# Patient Record
Sex: Female | Born: 1955 | Race: White | Hispanic: No | Smoking: Never smoker
Health system: Southern US, Community
[De-identification: ages and names within clinical notes are randomized; demographics above are authoritative.]

## PROBLEM LIST (undated history)

## (undated) DIAGNOSIS — Z973 Presence of spectacles and contact lenses: Secondary | ICD-10-CM

## (undated) DIAGNOSIS — K219 Gastro-esophageal reflux disease without esophagitis: Secondary | ICD-10-CM

## (undated) DIAGNOSIS — Z8719 Personal history of other diseases of the digestive system: Secondary | ICD-10-CM

## (undated) DIAGNOSIS — I10 Essential (primary) hypertension: Secondary | ICD-10-CM

## (undated) DIAGNOSIS — M1990 Unspecified osteoarthritis, unspecified site: Secondary | ICD-10-CM

## (undated) DIAGNOSIS — E785 Hyperlipidemia, unspecified: Secondary | ICD-10-CM

## (undated) DIAGNOSIS — E119 Type 2 diabetes mellitus without complications: Secondary | ICD-10-CM

## (undated) HISTORY — DX: Hyperlipidemia, unspecified: E78.5

## (undated) HISTORY — PX: OTHER SURGICAL HISTORY: SHX169

---

## 2004-08-27 HISTORY — PX: COLONOSCOPY: SHX174

## 2004-12-15 ENCOUNTER — Ambulatory Visit: Payer: Self-pay | Admitting: Surgery

## 2005-01-16 ENCOUNTER — Ambulatory Visit: Payer: Self-pay | Admitting: Gastroenterology

## 2005-03-27 ENCOUNTER — Ambulatory Visit: Payer: Self-pay | Admitting: Family Medicine

## 2008-01-14 ENCOUNTER — Ambulatory Visit: Payer: Self-pay | Admitting: Family Medicine

## 2013-10-05 ENCOUNTER — Ambulatory Visit: Payer: Self-pay | Admitting: Internal Medicine

## 2013-11-16 ENCOUNTER — Encounter (INDEPENDENT_AMBULATORY_CARE_PROVIDER_SITE_OTHER): Payer: Self-pay

## 2013-11-16 ENCOUNTER — Ambulatory Visit: Payer: Self-pay | Admitting: Internal Medicine

## 2013-11-16 ENCOUNTER — Ambulatory Visit (INDEPENDENT_AMBULATORY_CARE_PROVIDER_SITE_OTHER): Payer: BC Managed Care – PPO | Admitting: Internal Medicine

## 2013-11-16 ENCOUNTER — Encounter: Payer: Self-pay | Admitting: Internal Medicine

## 2013-11-16 VITALS — BP 138/78 | HR 90 | Temp 97.9°F | Resp 16 | Ht 67.0 in | Wt 181.0 lb

## 2013-11-16 DIAGNOSIS — E559 Vitamin D deficiency, unspecified: Secondary | ICD-10-CM

## 2013-11-16 DIAGNOSIS — Z800 Family history of malignant neoplasm of digestive organs: Secondary | ICD-10-CM

## 2013-11-16 DIAGNOSIS — Z23 Encounter for immunization: Secondary | ICD-10-CM

## 2013-11-16 DIAGNOSIS — E785 Hyperlipidemia, unspecified: Secondary | ICD-10-CM

## 2013-11-16 DIAGNOSIS — M1990 Unspecified osteoarthritis, unspecified site: Secondary | ICD-10-CM

## 2013-11-16 DIAGNOSIS — R5381 Other malaise: Secondary | ICD-10-CM

## 2013-11-16 DIAGNOSIS — Z780 Asymptomatic menopausal state: Secondary | ICD-10-CM

## 2013-11-16 DIAGNOSIS — R5383 Other fatigue: Principal | ICD-10-CM

## 2013-11-16 DIAGNOSIS — Z1239 Encounter for other screening for malignant neoplasm of breast: Secondary | ICD-10-CM

## 2013-11-16 NOTE — Patient Instructions (Addendum)
Welcome to our practice!  I appreciate Lauren referring you and your husband to me  Return at your convenience for fasting labs,  And we will also check your vitamin d and thyroid level.  Amber will call you with an appt for the mammogram  You can set up your physical with me whenever you like

## 2013-11-16 NOTE — Progress Notes (Signed)
Patient ID: Kathy [name], female   DOB: 12/28/1955, 58 y.o.   MRN: [id number]  Patient Active Problem List   Diagnosis Date Noted  . Family history of colon cancer requiring screening colonoscopy 11/17/2013  . Screening for breast cancer 11/17/2013  . Osteoarthritis 11/17/2013  . Menopause 11/17/2013    Subjective:  CC:   Chief Complaint  Patient presents with  . Establish Care    HPI:   Kathy [name] a 58 y.o. female who presents to establish care .  Her last exam was in 2006 at [facility]. She had a  normal screening colonoscopy in 2006 at [place] and historically  normal mammograms  Last one at [facility]. .  Works two part times jobs. enjoys what she is doing.    Gardening ,  Sewing are her hobbies.  She and husband have a private vineyard.  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 Half sister with breast ca.    History reviewed. No pertinent past medical history.   Allergies  Allergen Reactions  . Neosporin [Neomycin-Bacitracin Zn-Polymyx] Rash      Past Surgical History  Procedure Laterality Date  . Fatty tissue under bilateral arm      History   Social History  . Marital Status: Married    Spouse Name: N/A    Number of Children: N/A  . Years of Education: N/A   Occupational History  . Not on file.   Social History Main Topics  . Smoking status: Never Smoker   . Smokeless tobacco: Never Used  . Alcohol Use: Yes     Comment: occasionally  . Drug Use: No  . Sexual Activity: No   Other Topics Concern  . Not on file   Social History Narrative  . No narrative on file   Family History  Problem Relation Age of Onset  . Hyperlipidemia Mother   . Hypertension Mother   . Diabetes Mother   . Cancer Father 23    colon      Review of Systems:   The rest of the review of systems was negative except those addressed in the HPI.      Objective:  BP 138/78  Pulse 90  Temp(Src) 97.9 F (36.6 C) (Oral)  Resp 16  Ht 5\' 7"  (1.702 m)  Wt 181 lb (82.101 kg)  BMI 28.34 kg/m2  SpO2 98%  General appearance: alert, cooperative and appears stated age Ears: normal TM's and external ear canals both ears Throat: lips, mucosa, and tongue normal; teeth and gums normal Neck: no adenopathy, no carotid bruit, supple, symmetrical, trachea midline and thyroid not enlarged, symmetric, no tenderness/mass/nodules Back: symmetric, no curvature. ROM normal. No CVA tenderness. Lungs: clear to auscultation bilaterally Heart: regular rate and rhythm, S1, S2 normal, no murmur, click, rub or gallop Abdomen: soft, non-tender; bowel sounds normal; no masses,  no organomegaly Pulses: 2+ and symmetric Skin: Skin color, texture, turgor normal. No rashes or lesions Lymph nodes: Cervical, supraclavicular, and axillary nodes normal.  Assessment and Plan:  Family history of colon cancer requiring screening colonoscopy Patient is adamant that her father's colon Ca was environmentally induced and has not had 5 yr follow up based on this assumption  Has no change in bowel habits or blood in stool and last colonoscopy was normal at at 49. .  Will request records from [facility].    Screening for breast cancer Mammogram ordered.  Return for annul exam 3 month s  Osteoarthritis Managed with krill oil.   Menopause Currently asymptomatic.    Updated Medication List Outpatient Encounter Prescriptions as of 11/16/2013  Medication Sig  . aspirin 81 MG tablet Take 81 mg by mouth daily.  . Cholecalciferol (VITAMIN D3) 10000 UNITS capsule Take 10,000 Units by mouth daily.  . Cranberry (SM CRANBERRY) 300 MG tablet Take 300 mg by mouth daily.  . Cyanocobalamin 5000 MCG SUBL Place 1 tablet under the tongue daily.  Marland Kitchen KRILL OIL PO Take 1  capsule by mouth daily.  . Multiple Vitamins-Minerals (MULTIVITAMIN WITH MINERALS) tablet Take 1 tablet by mouth 2 (two) times daily.  . naproxen sodium (ANAPROX) 220 MG tablet Take 220 mg by mouth daily.     Orders Placed This Encounter  Procedures  . MM DIGITAL SCREENING BILATERAL  . Tdap vaccine greater than or equal to 7yo IM  . Comprehensive metabolic panel  . CBC with Differential  . TSH  . Lipid panel  . Vit D  25 hydroxy (rtn osteoporosis monitoring)    No Follow-up on file.

## 2013-11-17 ENCOUNTER — Encounter: Payer: Self-pay | Admitting: Internal Medicine

## 2013-11-17 DIAGNOSIS — Z1239 Encounter for other screening for malignant neoplasm of breast: Secondary | ICD-10-CM | POA: Insufficient documentation

## 2013-11-17 DIAGNOSIS — Z780 Asymptomatic menopausal state: Secondary | ICD-10-CM | POA: Insufficient documentation

## 2013-11-17 DIAGNOSIS — M1990 Unspecified osteoarthritis, unspecified site: Secondary | ICD-10-CM | POA: Insufficient documentation

## 2013-11-17 DIAGNOSIS — Z800 Family history of malignant neoplasm of digestive organs: Secondary | ICD-10-CM | POA: Insufficient documentation

## 2013-11-17 NOTE — Assessment & Plan Note (Signed)
Mammogram ordered.  Return for annul exam 3 month s

## 2013-11-17 NOTE — Assessment & Plan Note (Signed)
Managed with krill oil.

## 2013-11-17 NOTE — Assessment & Plan Note (Signed)
Patient is adamant that her father's colon Ca was environmentally induced and has not had 5 yr follow up based on this assumption Has no change in bowel habits or blood in stool and last colonoscopy was normal at at 49. .  Will request records from [facility].

## 2013-11-17 NOTE — Assessment & Plan Note (Signed)
Currently asymptomatic 

## 2013-11-24 ENCOUNTER — Encounter: Payer: Self-pay | Admitting: Emergency Medicine

## 2013-11-26 ENCOUNTER — Other Ambulatory Visit (INDEPENDENT_AMBULATORY_CARE_PROVIDER_SITE_OTHER): Payer: BC Managed Care – PPO

## 2013-11-26 DIAGNOSIS — E785 Hyperlipidemia, unspecified: Secondary | ICD-10-CM

## 2013-11-26 DIAGNOSIS — R5383 Other fatigue: Principal | ICD-10-CM

## 2013-11-26 DIAGNOSIS — R5381 Other malaise: Secondary | ICD-10-CM

## 2013-11-26 DIAGNOSIS — E559 Vitamin D deficiency, unspecified: Secondary | ICD-10-CM

## 2013-11-26 LAB — COMPREHENSIVE METABOLIC PANEL
ALK PHOS: 72 U/L (ref 39–117)
ALT: 16 U/L (ref 0–35)
AST: 18 U/L (ref 0–37)
Albumin: 4.1 g/dL (ref 3.5–5.2)
BUN: 16 mg/dL (ref 6–23)
CO2: 28 mEq/L (ref 19–32)
Calcium: 9.3 mg/dL (ref 8.4–10.5)
Chloride: 102 mEq/L (ref 96–112)
Creatinine, Ser: 0.8 mg/dL (ref 0.4–1.2)
GFR: 78.05 mL/min (ref >60.00)
Glucose, Bld: 100 mg/dL — ABNORMAL HIGH (ref 70–99)
POTASSIUM: 3.9 meq/L (ref 3.5–5.1)
SODIUM: 138 meq/L (ref 135–145)
TOTAL PROTEIN: 6.9 g/dL (ref 6.0–8.3)
Total Bilirubin: 0.5 mg/dL (ref 0.3–1.2)

## 2013-11-26 LAB — LIPID PANEL
Cholesterol: 217 mg/dL — ABNORMAL HIGH (ref 0–200)
HDL: 90.1 mg/dL (ref >39.00)
LDL Cholesterol: 120 mg/dL — ABNORMAL HIGH (ref 0–99)
Total CHOL/HDL Ratio: 2
Triglycerides: 35 mg/dL (ref 0.0–149.0)
VLDL: 7 mg/dL (ref 0.0–40.0)

## 2013-11-26 LAB — CBC WITH DIFFERENTIAL/PLATELET
BASOS ABS: 0 10*3/uL (ref 0.0–0.1)
Basophils Relative: 0.5 % (ref 0.0–3.0)
EOS PCT: 2.1 % (ref 0.0–5.0)
Eosinophils Absolute: 0.1 10*3/uL (ref 0.0–0.7)
HEMATOCRIT: 35.3 % — AB (ref 36.0–46.0)
Hemoglobin: 12 g/dL (ref 12.0–15.0)
Lymphocytes Relative: 22.1 % (ref 12.0–46.0)
Lymphs Abs: 1.2 10*3/uL (ref 0.7–4.0)
MCHC: 33.8 g/dL (ref 30.0–36.0)
MCV: 90.8 fl (ref 78.0–100.0)
Monocytes Absolute: 0.3 10*3/uL (ref 0.1–1.0)
Monocytes Relative: 5.3 % (ref 3.0–12.0)
Neutro Abs: 3.8 10*3/uL (ref 1.4–7.7)
Neutrophils Relative %: 70 % (ref 43.0–77.0)
PLATELETS: 301 10*3/uL (ref 150.0–400.0)
RBC: 3.89 Mil/uL (ref 3.87–5.11)
RDW: 12.6 % (ref 11.5–14.6)
WBC: 5.4 10*3/uL (ref 4.5–10.5)

## 2013-11-26 LAB — TSH: TSH: 0.88 u[IU]/mL (ref 0.35–5.50)

## 2013-11-27 LAB — VITAMIN D 25 HYDROXY (VIT D DEFICIENCY, FRACTURES): Vit D, 25-Hydroxy: 73 ng/mL (ref 30–89)

## 2013-11-29 ENCOUNTER — Encounter: Payer: Self-pay | Admitting: Internal Medicine

## 2013-11-30 LAB — HM MAMMOGRAPHY: HM Mammogram: NORMAL

## 2013-12-01 ENCOUNTER — Other Ambulatory Visit: Payer: BC Managed Care – PPO

## 2013-12-10 ENCOUNTER — Ambulatory Visit: Payer: Self-pay | Admitting: Internal Medicine

## 2013-12-21 ENCOUNTER — Other Ambulatory Visit (HOSPITAL_COMMUNITY)
Admission: RE | Admit: 2013-12-21 | Discharge: 2013-12-21 | Disposition: A | Discharge status: Home / Self Care | Payer: BC Managed Care – PPO | Source: Ambulatory Visit | Attending: Internal Medicine | Admitting: Internal Medicine

## 2013-12-21 ENCOUNTER — Ambulatory Visit (INDEPENDENT_AMBULATORY_CARE_PROVIDER_SITE_OTHER): Payer: BC Managed Care – PPO | Admitting: Internal Medicine

## 2013-12-21 ENCOUNTER — Encounter: Payer: Self-pay | Admitting: Internal Medicine

## 2013-12-21 VITALS — BP 124/74 | HR 79 | Temp 98.3°F | Resp 16 | Ht 67.0 in | Wt 180.2 lb

## 2013-12-21 DIAGNOSIS — Z124 Encounter for screening for malignant neoplasm of cervix: Secondary | ICD-10-CM

## 2013-12-21 DIAGNOSIS — R9389 Abnormal findings on diagnostic imaging of other specified body structures: Secondary | ICD-10-CM

## 2013-12-21 DIAGNOSIS — Z1151 Encounter for screening for human papillomavirus (HPV): Secondary | ICD-10-CM | POA: Insufficient documentation

## 2013-12-21 DIAGNOSIS — Z01411 Encounter for gynecological examination (general) (routine) with abnormal findings: Secondary | ICD-10-CM | POA: Insufficient documentation

## 2013-12-21 DIAGNOSIS — Z01419 Encounter for gynecological examination (general) (routine) without abnormal findings: Secondary | ICD-10-CM | POA: Insufficient documentation

## 2013-12-21 DIAGNOSIS — Z0000 Encounter for general adult medical examination without abnormal findings: Secondary | ICD-10-CM

## 2013-12-21 NOTE — Progress Notes (Signed)
Patient ID: Kathy [name], female   DOB: 27-May-1956, 58 y.o.   MRN: [id number]   Subjective:     Kathy [name] is a 58 y.o. female and is here for a comprehensive physical exam. The patient reports no problems.  History   Social History  . Marital Status: Married    Spouse Name: N/A    Number of Children: N/A  . Years of Education: N/A   Occupational History  . Not on file.   Social History Main Topics  . Smoking status: Never Smoker   . Smokeless tobacco: Never Used  . Alcohol Use: Yes     Comment: occasionally  . Drug Use: No  . Sexual Activity: No   Other Topics Concern  . Not on file   Social History Narrative  . No narrative on file   Health Maintenance  Topic Date Due  . Pap Smear  12/26/1972  . Mammogram  12/26/2004  . Colonoscopy  12/26/2004  . Influenza Vaccine  03/27/2014  . Tetanus/tdap  11/17/2023    The following portions of the patient's history were reviewed and updated as appropriate: allergies, current medications, past family history, past medical history, past social history, past surgical history and problem list.  Review of Systems A comprehensive review of systems was negative.   Objective:   General Appearance:    Alert, cooperative, no distress, appears stated age  Head:    Normocephalic, without obvious abnormality, atraumatic  Eyes:    PERRL, conjunctiva/corneas clear, EOM's intact, fundi    benign, both eyes  Ears:    Normal TM's and external ear canals, both ears  Nose:   Nares normal, septum midline, mucosa normal, no drainage    or sinus tenderness  Throat:   Lips, mucosa, and tongue normal; teeth and gums normal  Neck:   Supple, symmetrical, trachea midline, no adenopathy;    thyroid:  no enlargement/tenderness/nodules; no carotid   bruit or JVD  Back:     Symmetric, no curvature, ROM normal, no CVA tenderness  Lungs:     Clear to auscultation bilaterally, respirations unlabored  Chest Wall:    No tenderness or deformity   Heart:     Regular rate and rhythm, S1 and S2 normal, no murmur, rub   or gallop  Breast Exam:    No tenderness, masses, or nipple abnormality  Abdomen:     Soft, non-tender, bowel sounds active all four quadrants,    no masses, no organomegaly  Genitalia:    Pelvic: cervix normal in appearance, external genitalia normal, no adnexal masses or tenderness, no cervical motion tenderness, rectovaginal septum abnormal with a firmeness noted in posterior wall of vagina normal, uterus normal size, shape, and consistency   Extremities:   Extremities normal, atraumatic, no cyanosis or edema  Pulses:   2+ and symmetric all extremities  Skin:   Skin color, texture, turgor normal, no rashes or lesions  Lymph nodes:   Cervical, supraclavicular, and axillary nodes normal  Neurologic:   CNII-XII intact, normal strength, sensation and reflexes    throughout     Assessment and Plan:    Encounter for preventive health examination Annual comprehensive exam was done including breast, pelvic and PAP smear. All screenings have been addressed .   Abnormal pelvic exam Posterior vaginal wall has an abnormal firmness that needs further evaluation. Referral to [facility].    Updated Medication List Outpatient Encounter Prescriptions as of 12/21/2013  Medication Sig  . aspirin 81 MG tablet Take 81  mg by mouth daily.  . Cholecalciferol (VITAMIN D3) 10000 UNITS capsule Take 10,000 Units by mouth daily.  . Cranberry (SM CRANBERRY) 300 MG tablet Take 300 mg by mouth daily.  . Cyanocobalamin 5000 MCG SUBL Place 1 tablet under the tongue daily.  Marland Kitchen KRILL OIL PO Take 1 capsule by mouth daily.  . Multiple Vitamins-Minerals (MULTIVITAMIN WITH MINERALS) tablet Take 1 tablet by mouth 2 (two) times daily.  . naproxen sodium (ANAPROX) 220 MG tablet Take 220 mg by mouth daily.

## 2013-12-21 NOTE — Progress Notes (Signed)
Pre-visit discussion using our clinic review tool. No additional management support is needed unless otherwise documented below in the visit note.  

## 2013-12-21 NOTE — Assessment & Plan Note (Signed)
Posterior vaginal wall has an abnormal firmness that needs further evaluation. Referral to [facility].

## 2013-12-21 NOTE — Assessment & Plan Note (Signed)
Annual comprehensive exam was done including breast, pelvic and PAP smear. All screenings have been addressed .  

## 2013-12-21 NOTE — Patient Instructions (Signed)
You had your annual  wellness exam today  Your PAP smear was done  Your vaginal exam was slightly abnormal due to a firmness in on your ledt side,  i would like a gynecologist to evaluate you and will start a referral to Dr Enzo Bi.,

## 2013-12-24 ENCOUNTER — Encounter: Payer: Self-pay | Admitting: Internal Medicine

## 2014-01-07 ENCOUNTER — Encounter: Payer: Self-pay | Admitting: Internal Medicine

## 2014-01-07 NOTE — Telephone Encounter (Signed)
Mailed unread message to pt  

## 2014-01-31 ENCOUNTER — Emergency Department: Payer: Self-pay | Admitting: Emergency Medicine

## 2014-02-03 ENCOUNTER — Ambulatory Visit (INDEPENDENT_AMBULATORY_CARE_PROVIDER_SITE_OTHER): Payer: BC Managed Care – PPO | Admitting: Adult Health

## 2014-02-03 ENCOUNTER — Encounter: Payer: Self-pay | Admitting: Adult Health

## 2014-02-03 VITALS — BP 161/84 | HR 70 | Temp 98.2°F | Resp 14 | Ht 67.0 in | Wt 180.2 lb

## 2014-02-03 DIAGNOSIS — W5911XA Bitten by nonvenomous snake, initial encounter: Secondary | ICD-10-CM

## 2014-02-03 DIAGNOSIS — W5901XA Bitten by nonvenomous lizards, initial encounter: Secondary | ICD-10-CM

## 2014-02-03 DIAGNOSIS — M7989 Other specified soft tissue disorders: Secondary | ICD-10-CM

## 2014-02-03 NOTE — Progress Notes (Signed)
Patient ID: Kathy [name], female   DOB: Jan 20, 1956, 58 y.o.   MRN: [id number]   Subjective:    Patient ID: Kathy [name], female    DOB: [birth year], 58 y.o.   MRN: [id number]  HPI Pt is a pleasant 58 y/o female who presents to clinic following snake bite to the right great toe on Saturday evening. She was seen in the emergency room. She has been started on Keflex 3 times a day x7 days. Patient did not have any systemic symptoms. She reports that her right foot became swollen and bruised. This has all improved. She has developed a blister at the tip of the great toe.   Current Outpatient Prescriptions on File Prior to Visit  Medication Sig Dispense Refill  . aspirin 81 MG tablet Take 81 mg by mouth daily.      . Cholecalciferol (VITAMIN D3) 10000 UNITS capsule Take 10,000 Units by mouth daily.      . Cranberry (SM CRANBERRY) 300 MG tablet Take 300 mg by mouth daily.      . Cyanocobalamin 5000 MCG SUBL Place 1 tablet under the tongue daily.      Marland Kitchen KRILL OIL PO Take 1 capsule by mouth daily.      . Multiple Vitamins-Minerals (MULTIVITAMIN WITH MINERALS) tablet Take 1 tablet by mouth 2 (two) times daily.      . naproxen sodium (ANAPROX) 220 MG tablet Take 220 mg by mouth daily.       No current facility-administered medications on file prior to visit.     Review of Systems  Constitutional: Negative.  Negative for fever, chills and fatigue.  Respiratory: Negative for chest tightness and shortness of breath.   Cardiovascular: Positive for leg swelling (swelling of right foot). Negative for chest pain.  Gastrointestinal: Negative.   Genitourinary: Negative.   Musculoskeletal: Positive for arthralgias.  Neurological: Negative for dizziness, weakness, light-headedness and headaches.  Psychiatric/Behavioral: Negative.        Objective:  BP 161/84  Pulse 70  Temp(Src) 98.2 F (36.8 C) (Oral)  Resp 14  Ht 5\' 7"  (1.702 m)  Wt 180 lb 4 oz (81.761 kg)  BMI 28.22 kg/m2  SpO2 99%   Physical  Exam  Constitutional: She is oriented to person, place, and time. She appears well-developed. No distress.  HENT:  Head: Normocephalic and atraumatic.  Eyes: Conjunctivae and EOM are normal.  Neck: Normal range of motion. Neck supple.  Cardiovascular: Normal rate and regular rhythm.   Pulmonary/Chest: Effort normal. No respiratory distress.  Musculoskeletal: Normal range of motion. She exhibits edema.  Edema of right foot.  Neurological: She is alert and oriented to person, place, and time. She has normal reflexes.  Skin: Skin is warm and dry.  Right great toe with blister on the tip. Bruised. Erythema surrounding nail and nail bed.   Psychiatric: She has a normal mood and affect. Her behavior is normal. Judgment and thought content normal.      Assessment & Plan:   1. Nonvenomous snake bite Continue keflex 500 mg tid as directed.  Elevate foot as much as possible and try to stay off the right foot. Keep clean and dry. Do not apply ice directly where snake bit you. You can apply ice on the top of foot for swelling. Call if symptoms worsen. It appears to be healing nicely. Do not pop blister.

## 2014-02-03 NOTE — Progress Notes (Signed)
Pre visit review using our clinic review tool, if applicable. No additional management support is needed unless otherwise documented below in the visit note. 

## 2014-02-18 ENCOUNTER — Other Ambulatory Visit: Payer: Self-pay | Admitting: Internal Medicine

## 2014-02-18 DIAGNOSIS — Z1211 Encounter for screening for malignant neoplasm of colon: Secondary | ICD-10-CM

## 2014-08-02 ENCOUNTER — Encounter: Payer: Self-pay | Admitting: Internal Medicine

## 2014-08-02 ENCOUNTER — Ambulatory Visit (INDEPENDENT_AMBULATORY_CARE_PROVIDER_SITE_OTHER): Payer: BC Managed Care – PPO | Admitting: Internal Medicine

## 2014-08-02 VITALS — BP 140/88 | HR 78 | Temp 98.2°F | Resp 16 | Ht 66.0 in | Wt 180.5 lb

## 2014-08-02 DIAGNOSIS — J209 Acute bronchitis, unspecified: Secondary | ICD-10-CM

## 2014-08-02 DIAGNOSIS — L723 Sebaceous cyst: Secondary | ICD-10-CM

## 2014-08-02 DIAGNOSIS — L089 Local infection of the skin and subcutaneous tissue, unspecified: Secondary | ICD-10-CM

## 2014-08-02 MED ORDER — PREDNISONE (PAK) 10 MG PO TABS: ORAL_TABLET | ORAL | Status: DC

## 2014-08-02 MED ORDER — SULFAMETHOXAZOLE-TRIMETHOPRIM 800-160 MG PO TABS: 1.0000 | ORAL_TABLET | Freq: Two times a day (BID) | ORAL | Status: DC

## 2014-08-02 NOTE — Progress Notes (Signed)
Patient ID: Kathy [name], female   DOB: [birth year], 58 y.o.   MRN: [id number]   Patient Active Problem List   Diagnosis Date Noted  . Acute bronchitis 08/03/2014  . Infected sebaceous cyst of skin 08/03/2014  . Nonvenomous snake bite 02/03/2014  . Encounter for preventive health examination 12/21/2013  . Abnormal pelvic exam 12/21/2013  . Family history of colon cancer requiring screening colonoscopy 11/17/2013  . Screening for breast cancer 11/17/2013  . Osteoarthritis 11/17/2013  . Menopause 11/17/2013    Subjective:  CC:   Chief Complaint  Patient presents with  . Cough    congestion productive cough yellow in color usually after a meal  . Mass    posterior neck line to mid clavicular.    HPI:   WAHNETA [name] is a 58 y.o. female who presentsat the after hours clinic  for evaluation of two issues:  1) 4 weeks of sinus drainage without congestion, fevers or facial pain .  Has been treating with mucinex,, sudafed  , cough suppressant.  Still has cough which is at times productive.  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  No recent travel ,  Does not smoke.  No pleurisy    2) enlarging mass at base of neck on posterior side,  Has been present for years, but recently became red and larger.  Denies spontaneous drainage , has not been manipulating it,  Slightly tender.    No past medical history on file.  Past Surgical History  Procedure Laterality Date  . Fatty tissue under bilateral arm         The following portions of the patient's history were reviewed and updated as appropriate: Allergies, current medications, and problem list.    Review of Systems:   Patient denies headache, fevers, malaise, unintentional weight loss, skin rash, eye pain, sinus congestion and sinus pain, sore throat, dysphagia,  hemoptysis , cough, dyspnea, wheezing, chest pain, palpitations, orthopnea, edema, abdominal pain, nausea, melena, diarrhea, constipation, flank  pain, dysuria, hematuria, urinary  Frequency, nocturia, numbness, tingling, seizures,  Focal weakness, Loss of consciousness,  Tremor, insomnia, depression, anxiety, and suicidal ideation.     History   Social History  . Marital Status: Married    Spouse Name: N/A    Number of Children: N/A  . Years of Education: N/A   Occupational History  . Not on file.   Social History Main Topics  . Smoking status: Never Smoker   . Smokeless tobacco: Never Used  . Alcohol Use: Yes     Comment: occasionally  . Drug Use: No  . Sexual Activity: No   Other Topics Concern  . Not on file   Social History Narrative    Objective:  Filed Vitals:   08/02/14 1911  BP: 140/88  Pulse: 78  Temp: 98.2 F (36.8 C)  Resp: 16     General appearance: alert, cooperative and appears stated age Ears: normal TM's and external ear canals both ears Throat: lips, mucosa, and tongue normal; teeth and gums normal Neck: no adenopathy, no carotid bruit, supple, symmetrical, trachea midline and thyroid not enlarged, symmetric, no tenderness/mass/nodules Back: symmetric, no curvature. ROM normal. No CVA tenderness. Lungs: clear to auscultation bilaterally Heart: regular rate and rhythm, S1, S2 normal, no murmur, click, rub or gallop Abdomen: soft, non-tender; bowel sounds normal; no masses,  no organomegaly Pulses: 2+ and symmetric Skin: Skin color, texture, turgor normal. No rashes or lesions Lymph nodes: Cervical,  supraclavicular, and axillary nodes normal.  Assessment and Plan:  Acute bronchitis URI is most likely viral given the mild HEENT  Symptoms  And normal exam.   I have explained that in viral URIS, an antibiotic will not help the symptoms and will increase the risk of developing diarrhea.,  Continue oral and nasal decongestants, and tylenol 650 mq 8 hrs for aches and pains,  And will prednisone  taper for inflammation  Infected sebaceous cyst of skin Prescribing Septra DS for one week,  Warm  compresses.  If no improvement refer to Gen surg for I & D>    Updated Medication List Outpatient Encounter Prescriptions as of 08/02/2014  Medication Sig  . Ascorbic Acid (VITAMIN C) 1000 MG tablet Take 1,000 mg by mouth daily.  Marland Kitchen aspirin 81 MG tablet Take 81 mg by mouth daily.  . Cholecalciferol (VITAMIN D3) 10000 UNITS capsule Take 10,000 Units by mouth daily.  . Cranberry (SM CRANBERRY) 300 MG tablet Take 300 mg by mouth daily.  . Cyanocobalamin 5000 MCG SUBL Place 1 tablet under the tongue daily.  Marland Kitchen KRILL OIL PO Take 1 capsule by mouth daily.  . Multiple Vitamins-Minerals (MULTIVITAMIN WITH MINERALS) tablet Take 1 tablet by mouth 2 (two) times daily.  . naproxen sodium (ANAPROX) 220 MG tablet Take 220 mg by mouth daily.  . predniSONE (STERAPRED UNI-PAK) 10 MG tablet 6 tablets on Day 1 , then reduce by 1 tablet daily until gone  . sulfamethoxazole-trimethoprim (SEPTRA DS) 800-160 MG per tablet Take 1 tablet by mouth 2 (two) times daily.     No orders of the defined types were placed in this encounter.    No Follow-up on file.

## 2014-08-02 NOTE — Progress Notes (Signed)
Pre-visit discussion using our clinic review tool. No additional management support is needed unless otherwise documented below in the visit note.  

## 2014-08-02 NOTE — Patient Instructions (Addendum)
I Am treating you for an infected sebaceous cyst with a week of Septra  DS .  DO NOT TRY TO SQUEEZE IT.  It may start to spontaneously drain if you apply warm compresses  If it does not resolve with the antibiotics,  I will refer you to Dr. Bary Castilla to have it incised and drained  The prednisone taper will help resolve your bronchitis.   Please take a probiotic ( Align, Floraque or Culturelle) for 2 weeks if you start the antibiotic to prevent a serious antibiotic associated diarrhea  Called" clostridium dificile colitis" ( should also help prevent   vaginal yeast infection) 9

## 2014-08-03 DIAGNOSIS — L089 Local infection of the skin and subcutaneous tissue, unspecified: Secondary | ICD-10-CM | POA: Insufficient documentation

## 2014-08-03 DIAGNOSIS — L723 Sebaceous cyst: Secondary | ICD-10-CM

## 2014-08-03 DIAGNOSIS — J209 Acute bronchitis, unspecified: Secondary | ICD-10-CM | POA: Insufficient documentation

## 2014-08-03 NOTE — Assessment & Plan Note (Signed)
URI is most likely viral given the mild HEENT  Symptoms  And normal exam.   I have explained that in viral URIS, an antibiotic will not help the symptoms and will increase the risk of developing diarrhea.,  Continue oral and nasal decongestants, and tylenol 650 mq 8 hrs for aches and pains,  And will prednisone  taper for inflammation 

## 2014-08-03 NOTE — Assessment & Plan Note (Signed)
Prescribing Septra DS for one week,  Warm compresses.  If no improvement refer to Gen surg for I & D>

## 2014-08-10 ENCOUNTER — Encounter: Payer: Self-pay | Admitting: Internal Medicine

## 2014-08-10 DIAGNOSIS — L723 Sebaceous cyst: Secondary | ICD-10-CM

## 2014-08-16 NOTE — Telephone Encounter (Signed)
The patient called asking had she been referred to a surgeon for the cyst that is infected on her back.  She is needing a referral put in the system.

## 2014-08-17 ENCOUNTER — Encounter: Payer: Self-pay | Admitting: General Surgery

## 2014-08-17 NOTE — Telephone Encounter (Signed)
Please review previous note from [place]. Patient would like a referral related to the cyst on her back. Please advise.

## 2014-08-24 ENCOUNTER — Encounter: Payer: Self-pay | Admitting: General Surgery

## 2014-08-24 ENCOUNTER — Ambulatory Visit (INDEPENDENT_AMBULATORY_CARE_PROVIDER_SITE_OTHER): Payer: BC Managed Care – PPO | Admitting: General Surgery

## 2014-08-24 DIAGNOSIS — L089 Local infection of the skin and subcutaneous tissue, unspecified: Secondary | ICD-10-CM

## 2014-08-24 DIAGNOSIS — L723 Sebaceous cyst: Secondary | ICD-10-CM

## 2014-08-24 NOTE — Patient Instructions (Signed)
Warm compresses daily to the area

## 2014-08-24 NOTE — Progress Notes (Signed)
Patient ID: Kathy [name], female   DOB: May 08, 1956, 58 y.o.   MRN: [id number]  Chief Complaint  Patient presents with  . Cyst    HPI Kathy [name] is a 58 y.o. female.  Here today for evaluation of a cyst on her back. She first noticed the cyst getting larger after Thanksgiving. The area has been there for about one year but has never really bothered her before. She has finished her septra and prednisone. She did notice drainage on Monday 08-09-14 and says it is much better than it was. Denies pain in the area.   HPI  No past medical history on file.  Past Surgical History  Procedure Laterality Date  . Fatty tissue under bilateral arm    . Colonoscopy  2006    Family History  Problem Relation Age of Onset  . Hyperlipidemia Mother   . Hypertension Mother   . Diabetes Mother   . Cancer Father 25    colon    Social History History  Substance Use Topics  . Smoking status: Never Smoker   . Smokeless tobacco: Never Used  . Alcohol Use: Yes     Comment: occasionally    Allergies  Allergen Reactions  . Neosporin [Neomycin-Bacitracin Zn-Polymyx] Rash    Current Outpatient Prescriptions  Medication Sig Dispense Refill  . Ascorbic Acid (VITAMIN C) 1000 MG tablet Take 1,000 mg by mouth daily.    Marland Kitchen aspirin 81 MG tablet Take 81 mg by mouth daily.    . Cholecalciferol (VITAMIN D3) 10000 UNITS capsule Take 10,000 Units by mouth daily.    . Cranberry (SM CRANBERRY) 300 MG tablet Take 300 mg by mouth daily.    . Cyanocobalamin 5000 MCG SUBL Place 1 tablet under the tongue daily.    . diphenhydrAMINE (SOMINEX) 25 MG tablet Take 25 mg by mouth at bedtime as needed for sleep.    Marland Kitchen KRILL OIL PO Take 1 capsule by mouth daily.    . Multiple Vitamins-Minerals (MULTIVITAMIN WITH MINERALS) tablet Take 1 tablet by mouth 2 (two) times daily.    . naproxen sodium (ANAPROX) 220 MG tablet Take 220 mg by mouth daily.    . predniSONE (STERAPRED UNI-PAK) 10 MG tablet 6 tablets on Day 1 , then  reduce by 1 tablet daily until gone 21 tablet 0  . Probiotic Product (PROBIOTIC DAILY PO) Take by mouth daily.    Marland Kitchen sulfamethoxazole-trimethoprim (SEPTRA DS) 800-160 MG per tablet Take 1 tablet by mouth 2 (two) times daily. 14 tablet 0   No current facility-administered medications for this visit.    Review of Systems Review of Systems  Constitutional: Negative.   Respiratory: Negative.   Cardiovascular: Negative.     Blood pressure 130/64, pulse 78, resp. rate 12, height 5\' 6"  (1.676 m), weight 180 lb (81.647 kg).  Physical Exam Physical Exam  Constitutional: She is oriented to person, place, and time. She appears well-developed and well-nourished.  Musculoskeletal:       Arms: Neurological: She is alert and oriented to person, place, and time.  Skin: Skin is warm and dry.  2 x 3 cm area of resolving induration with central pore right of midline at T1    Data Reviewed PCP notes.  Assessment    Infected sebaceous cyst of the right upper back, resolving.    Plan    Excisional be less extensive and less uncomfortable if the inflammatory process is given another couple of weeks to resolve. This should result in a  smaller scar and less likelihood of removing normal tissue.    Warm compresses daily to the area  Follow up 2 weeks for excision.   PCP:  Mattie Marlin 08/25/2014, 6:49 AM

## 2014-08-25 DIAGNOSIS — L089 Local infection of the skin and subcutaneous tissue, unspecified: Secondary | ICD-10-CM | POA: Insufficient documentation

## 2014-08-25 DIAGNOSIS — L723 Sebaceous cyst: Principal | ICD-10-CM

## 2014-09-09 ENCOUNTER — Encounter: Payer: Self-pay | Admitting: General Surgery

## 2014-09-09 ENCOUNTER — Ambulatory Visit (INDEPENDENT_AMBULATORY_CARE_PROVIDER_SITE_OTHER): Payer: BLUE CROSS/BLUE SHIELD | Admitting: General Surgery

## 2014-09-09 VITALS — BP 144/70 | Ht 66.0 in | Wt 183.0 lb

## 2014-09-09 DIAGNOSIS — L723 Sebaceous cyst: Secondary | ICD-10-CM

## 2014-09-09 NOTE — Patient Instructions (Signed)
Keep area clean 

## 2014-09-09 NOTE — Progress Notes (Signed)
Patient ID: Kathy [name], female   DOB: 10/16/1955, 59 y.o.   MRN: [id number]  Chief Complaint  Patient presents with  . Procedure    HPI Kathy [name] is a 58 y.o. female.  Here today for excision of lesion base of posterior neck. She states the area drained again last Tuesday briefly.  HPI  No past medical history on file.  Past Surgical History  Procedure Laterality Date  . Fatty tissue under bilateral arm    . Colonoscopy  2006    Family History  Problem Relation Age of Onset  . Hyperlipidemia Mother   . Hypertension Mother   . Diabetes Mother   . Cancer Father 64    colon    Social History History  Substance Use Topics  . Smoking status: Never Smoker   . Smokeless tobacco: Never Used  . Alcohol Use: Yes     Comment: occasionally    Allergies  Allergen Reactions  . Neosporin [Neomycin-Bacitracin Zn-Polymyx] Rash    Current Outpatient Prescriptions  Medication Sig Dispense Refill  . Ascorbic Acid (VITAMIN C) 1000 MG tablet Take 1,000 mg by mouth daily.    Marland Kitchen aspirin 81 MG tablet Take 81 mg by mouth daily.    . Cholecalciferol (VITAMIN D3) 10000 UNITS capsule Take 10,000 Units by mouth daily.    . Cranberry (SM CRANBERRY) 300 MG tablet Take 300 mg by mouth daily.    . Cyanocobalamin 5000 MCG SUBL Place 1 tablet under the tongue daily.    . diphenhydrAMINE (SOMINEX) 25 MG tablet Take 25 mg by mouth at bedtime as needed for sleep.    Marland Kitchen KRILL OIL PO Take 1 capsule by mouth daily.    . Multiple Vitamins-Minerals (MULTIVITAMIN WITH MINERALS) tablet Take 1 tablet by mouth 2 (two) times daily.    . naproxen sodium (ANAPROX) 220 MG tablet Take 220 mg by mouth daily.    . predniSONE (STERAPRED UNI-PAK) 10 MG tablet 6 tablets on Day 1 , then reduce by 1 tablet daily until gone 21 tablet 0  . Probiotic Product (PROBIOTIC DAILY PO) Take by mouth daily.     No current facility-administered medications for this visit.    Review of Systems Review of Systems   Constitutional: Negative.   Respiratory: Negative.   Cardiovascular: Negative.     Blood pressure 144/70, height 5\' 6"  (1.676 m), weight 183 lb (83.008 kg).  Physical Exam Physical Exam  Constitutional: She is oriented to person, place, and time. She appears well-developed and well-nourished.  Neck:    Neurological: She is alert and oriented to person, place, and time.  Skin: Skin is warm.      Assessment    Sebaceous cyst of the right upper back.     Plan    The patient reports last episode drainage was approximately 9 days ago. The area looks much less inflamed. It was elected to proceed to excision.  The area was cleansed with alcohol and 20 mL of 0.5% Xylocaine with 0.25% Marcaine with 1-200,000 of epinephrine was utilized well tolerated. ChloraPrep was applied to the skin. The area was excised through elliptical incision. The deep tissue was approximated with a  3-0 Vicryl suture. The skin was closed with an interrupted 4-0 nylon suture. Telfa and Tegaderm dressing applied.  The patient will return in 7-10 days for suture removal with the nurse.      PCP:  Mattie Marlin 09/10/2014, 5:06 PM

## 2014-09-20 ENCOUNTER — Ambulatory Visit (INDEPENDENT_AMBULATORY_CARE_PROVIDER_SITE_OTHER): Payer: Self-pay | Admitting: *Deleted

## 2014-09-20 DIAGNOSIS — L723 Sebaceous cyst: Secondary | ICD-10-CM

## 2014-09-20 NOTE — Patient Instructions (Signed)
Patient to return as needed. 

## 2014-09-20 NOTE — Progress Notes (Signed)
Patient came in today for a wound check.  The wound is clean, with no signs of infection noted. The sutures were removed and steri strips applied.  

## 2015-06-14 ENCOUNTER — Telehealth: Payer: Self-pay | Admitting: Internal Medicine

## 2015-06-14 DIAGNOSIS — R5383 Other fatigue: Secondary | ICD-10-CM

## 2015-06-14 DIAGNOSIS — Z113 Encounter for screening for infections with a predominantly sexual mode of transmission: Secondary | ICD-10-CM

## 2015-06-14 DIAGNOSIS — E559 Vitamin D deficiency, unspecified: Secondary | ICD-10-CM

## 2015-06-14 DIAGNOSIS — E785 Hyperlipidemia, unspecified: Secondary | ICD-10-CM

## 2015-06-14 NOTE — Telephone Encounter (Signed)
Patient wanting to come a day before her physical to have fasting labs.

## 2015-06-14 NOTE — Telephone Encounter (Signed)
Fasting labs ordered

## 2015-06-14 NOTE — Telephone Encounter (Signed)
Please advise?  Last Labs were from 11/26/13.

## 2015-06-15 NOTE — Telephone Encounter (Signed)
Can you please schedule lab appointment for her?

## 2015-06-15 NOTE — Telephone Encounter (Signed)
Hey her lab appointment was already scheduled for 11/14 via Vanessa. :)

## 2015-06-16 ENCOUNTER — Encounter: Payer: Self-pay | Admitting: Internal Medicine

## 2015-06-23 ENCOUNTER — Other Ambulatory Visit: Payer: Self-pay | Admitting: Internal Medicine

## 2015-06-23 DIAGNOSIS — R1013 Epigastric pain: Secondary | ICD-10-CM

## 2015-06-23 MED ORDER — OMEPRAZOLE 40 MG PO CPDR: 40.0000 mg | DELAYED_RELEASE_CAPSULE | Freq: Every day | ORAL | Status: DC

## 2015-07-11 ENCOUNTER — Other Ambulatory Visit (INDEPENDENT_AMBULATORY_CARE_PROVIDER_SITE_OTHER): Payer: BLUE CROSS/BLUE SHIELD

## 2015-07-11 DIAGNOSIS — E785 Hyperlipidemia, unspecified: Secondary | ICD-10-CM | POA: Diagnosis not present

## 2015-07-11 DIAGNOSIS — E559 Vitamin D deficiency, unspecified: Secondary | ICD-10-CM

## 2015-07-11 DIAGNOSIS — R5383 Other fatigue: Secondary | ICD-10-CM | POA: Diagnosis not present

## 2015-07-11 DIAGNOSIS — Z113 Encounter for screening for infections with a predominantly sexual mode of transmission: Secondary | ICD-10-CM

## 2015-07-11 LAB — COMPREHENSIVE METABOLIC PANEL
ALBUMIN: 4.5 g/dL (ref 3.5–5.2)
ALT: 15 U/L (ref 0–35)
AST: 19 U/L (ref 0–37)
Alkaline Phosphatase: 84 U/L (ref 39–117)
BILIRUBIN TOTAL: 0.5 mg/dL (ref 0.2–1.2)
BUN: 22 mg/dL (ref 6–23)
CALCIUM: 10.2 mg/dL (ref 8.4–10.5)
CHLORIDE: 102 meq/L (ref 96–112)
CO2: 29 meq/L (ref 19–32)
Creatinine, Ser: 0.75 mg/dL (ref 0.40–1.20)
GFR: 83.63 mL/min (ref >60.00)
Glucose, Bld: 101 mg/dL — ABNORMAL HIGH (ref 70–99)
Potassium: 3.8 mEq/L (ref 3.5–5.1)
Sodium: 140 mEq/L (ref 135–145)
Total Protein: 7.3 g/dL (ref 6.0–8.3)

## 2015-07-11 LAB — LIPID PANEL
CHOLESTEROL: 213 mg/dL — AB (ref 0–200)
HDL: 84.2 mg/dL (ref >39.00)
LDL CALC: 120 mg/dL — AB (ref 0–99)
NonHDL: 128.95
TRIGLYCERIDES: 43 mg/dL (ref 0.0–149.0)
Total CHOL/HDL Ratio: 3
VLDL: 8.6 mg/dL (ref 0.0–40.0)

## 2015-07-11 LAB — CBC WITH DIFFERENTIAL/PLATELET
BASOS ABS: 0 10*3/uL (ref 0.0–0.1)
Basophils Relative: 0.5 % (ref 0.0–3.0)
EOS ABS: 0.2 10*3/uL (ref 0.0–0.7)
Eosinophils Relative: 3.7 % (ref 0.0–5.0)
HCT: 38.5 % (ref 36.0–46.0)
Hemoglobin: 12.9 g/dL (ref 12.0–15.0)
LYMPHS ABS: 1.4 10*3/uL (ref 0.7–4.0)
Lymphocytes Relative: 28 % (ref 12.0–46.0)
MCHC: 33.4 g/dL (ref 30.0–36.0)
MCV: 90.1 fl (ref 78.0–100.0)
MONO ABS: 0.3 10*3/uL (ref 0.1–1.0)
Monocytes Relative: 6.5 % (ref 3.0–12.0)
Neutro Abs: 3.1 10*3/uL (ref 1.4–7.7)
Neutrophils Relative %: 61.3 % (ref 43.0–77.0)
PLATELETS: 333 10*3/uL (ref 150.0–400.0)
RBC: 4.27 Mil/uL (ref 3.87–5.11)
RDW: 12.6 % (ref 11.5–15.5)
WBC: 5.1 10*3/uL (ref 4.0–10.5)

## 2015-07-11 LAB — TSH: TSH: 2.67 u[IU]/mL (ref 0.35–4.50)

## 2015-07-11 LAB — VITAMIN D 25 HYDROXY (VIT D DEFICIENCY, FRACTURES): VITD: 67.34 ng/mL (ref 30.00–100.00)

## 2015-07-12 ENCOUNTER — Ambulatory Visit (INDEPENDENT_AMBULATORY_CARE_PROVIDER_SITE_OTHER): Payer: BLUE CROSS/BLUE SHIELD | Admitting: Internal Medicine

## 2015-07-12 ENCOUNTER — Encounter: Payer: Self-pay | Admitting: Internal Medicine

## 2015-07-12 ENCOUNTER — Other Ambulatory Visit: Payer: Self-pay

## 2015-07-12 VITALS — BP 130/78 | HR 83 | Temp 98.5°F | Resp 18 | Wt 188.0 lb

## 2015-07-12 DIAGNOSIS — R1013 Epigastric pain: Secondary | ICD-10-CM

## 2015-07-12 DIAGNOSIS — Z800 Family history of malignant neoplasm of digestive organs: Secondary | ICD-10-CM

## 2015-07-12 DIAGNOSIS — Z0000 Encounter for general adult medical examination without abnormal findings: Secondary | ICD-10-CM

## 2015-07-12 DIAGNOSIS — Z1283 Encounter for screening for malignant neoplasm of skin: Secondary | ICD-10-CM

## 2015-07-12 DIAGNOSIS — Z1239 Encounter for other screening for malignant neoplasm of breast: Secondary | ICD-10-CM

## 2015-07-12 LAB — HEPATITIS C ANTIBODY: HCV AB: NEGATIVE

## 2015-07-12 LAB — HIV ANTIBODY (ROUTINE TESTING W REFLEX): HIV 1&2 Ab, 4th Generation: NONREACTIVE

## 2015-07-12 NOTE — Assessment & Plan Note (Addendum)
Annual comprehensive preventive exam was done .  During the course of the visit the patient was educated and counseled about appropriate screening and preventive services including :  diabetes screening, lipid analysis with projected  10 year  risk for CAD  Which is < 4% using the Framingham risk calculator for women, , nutrition counseling, breast and colorectal cancer screening, and recommended immunizations.  Printed recommendations for health maintenance screenings was given.   Lab Results  Component Value Date   CHOL 213* 07/11/2015   HDL 84.20 07/11/2015   LDLCALC 120* 07/11/2015   TRIG 43.0 07/11/2015   CHOLHDL 3 07/11/2015

## 2015-07-12 NOTE — Progress Notes (Signed)
Pre visit review using our clinic review tool, if applicable. No additional management support is needed unless otherwise documented below in the visit note. 

## 2015-07-12 NOTE — Progress Notes (Signed)
Patient ID: Kathy [name], female    DOB: 26-Oct-1955  Age: 59 y.o. MRN: [id number]  The patient is here for annual Medicare wellness examination and management of other chronic and acute problems.    Colonoscopy  [facility]  At age 43.Dr Candace Cruise.  Needs it by end of year  FH of colon ca and colon polyps  [facility] mammogram 2015  The risk factors are reflected in the social history. PAP negative 2015 Mammogram  Normal 2015  Wants to see a dermatologist isenstein recommended  Did not tolerate prilosec,  Caused nausea  The roster of all physicians providing medical care to patient - is listed in the Snapshot section of the chart.  Home safety : The patient has smoke detectors in the home. They wear seatbelts.  There are no firearms at home. There is no violence in the home.   There is no risks for hepatitis, STDs or HIV. There is no   history of blood transfusion. They have no travel history to infectious disease endemic areas of the world.  The patient has seen their dentist in the last six month. They have seen their eye doctor in the last year. They admit to slight hearing difficulty with regard to whispered voices and some television programs.  They have deferred audiologic testing in the last year.  They do not  have excessive sun exposure. Discussed the need for sun protection: hats, long sleeves and use of sunscreen if there is significant sun exposure.   Diet: the importance of a healthy diet is discussed. They do have a healthy diet.  The benefits of regular aerobic exercise were discussed. She walks 3 times per week ,  60 minutes.   Depression screen: there are no signs or vegative symptoms of depression- irritability, change in appetite, anhedonia, sadness/tearfullness.  The following portions of the patient's history were reviewed and updated as appropriate: allergies, current medications, past family history, past medical history,  past surgical history, past social history  and  problem list.  Visual acuity was not assessed per patient preference since she has regular follow up with her ophthalmologist. Hearing and body mass index were assessed and reviewed.   During the course of the visit the patient was educated and counseled about appropriate screening and preventive services including : fall prevention , diabetes screening, nutrition counseling, colorectal cancer screening, and recommended immunizations.    CC: The primary encounter diagnosis was Encounter for preventive health examination. Diagnoses of Family history of colon cancer requiring screening colonoscopy, Breast cancer screening, Dyspepsia, and Skin cancer screening were also pertinent to this visit.  History Kathy [name] has no past medical history on file.   She has past surgical history that includes fatty tissue under bilateral arm and Colonoscopy (2006).   Her family history includes Cancer (age of onset: 30) in her father; Diabetes in her mother; Hyperlipidemia in her mother; Hypertension in her mother.She reports that she has never smoked. She has never used smokeless tobacco. She reports that she drinks alcohol. She reports that she does not use illicit drugs.  Outpatient Prescriptions Prior to Visit  Medication Sig Dispense Refill  . Ascorbic Acid (VITAMIN C) 1000 MG tablet Take 1,000 mg by mouth daily.    Marland Kitchen aspirin 81 MG tablet Take 81 mg by mouth daily.    . Cholecalciferol (VITAMIN D3) 10000 UNITS capsule Take 10,000 Units by mouth daily.    . Cranberry (SM CRANBERRY) 300 MG tablet Take 300 mg by mouth daily.    Marland Kitchen  Cyanocobalamin 5000 MCG SUBL Place 1 tablet under the tongue daily.    Marland Kitchen KRILL OIL PO Take 1 capsule by mouth daily.    . Multiple Vitamins-Minerals (MULTIVITAMIN WITH MINERALS) tablet Take 1 tablet by mouth 2 (two) times daily.    . naproxen sodium (ANAPROX) 220 MG tablet Take 220 mg by mouth daily.    Marland Kitchen omeprazole (PRILOSEC) 40 MG capsule Take 1 capsule (40 mg total) by mouth daily.  30 capsule 3  . Probiotic Product (PROBIOTIC DAILY PO) Take by mouth daily.    . diphenhydrAMINE (SOMINEX) 25 MG tablet Take 25 mg by mouth at bedtime as needed for sleep.    . predniSONE (STERAPRED UNI-PAK) 10 MG tablet 6 tablets on Day 1 , then reduce by 1 tablet daily until gone (Patient not taking: Reported on 07/12/2015) 21 tablet 0   No facility-administered medications prior to visit.    Review of Systems   Patient denies headache, fevers, malaise, unintentional weight loss, skin rash, eye pain, sinus congestion and sinus pain, sore throat, dysphagia,  hemoptysis , cough, dyspnea, wheezing, chest pain, palpitations, orthopnea, edema, abdominal pain, nausea, melena, diarrhea, constipation, flank pain, dysuria, hematuria, urinary  Frequency, nocturia, numbness, tingling, seizures,  Focal weakness, Loss of consciousness,  Tremor, insomnia, depression, anxiety, and suicidal ideation.     Objective:  BP 130/78 mmHg  Pulse 83  Temp(Src) 98.5 F (36.9 C)  Resp 18  Wt 188 lb (85.276 kg)  SpO2 97%  Physical Exam  General appearance: alert, cooperative and appears stated age Head: Normocephalic, without obvious abnormality, atraumatic Eyes: conjunctivae/corneas clear. PERRL, EOM's intact. Fundi benign. Ears: normal TM's and external ear canals both ears Nose: Nares normal. Septum midline. Mucosa normal. No drainage or sinus tenderness. Throat: lips, mucosa, and tongue normal; teeth and gums normal Neck: no adenopathy, no carotid bruit, no JVD, supple, symmetrical, trachea midline and thyroid not enlarged, symmetric, no tenderness/mass/nodules Lungs: clear to auscultation bilaterally Breasts: normal appearance, no masses or tenderness Heart: regular rate and rhythm, S1, S2 normal, no murmur, click, rub or gallop Abdomen: soft, non-tender; bowel sounds normal; no masses,  no organomegaly Extremities: extremities normal, atraumatic, no cyanosis or edema Pulses: 2+ and symmetric Skin:  Skin color, texture, turgor normal. No rashes or lesions Neurologic: Alert and oriented X 3, normal strength and tone. Normal symmetric reflexes. Normal coordination and gait.   Assessment & Plan:   Problem List Items Addressed This Visit    Family history of colon cancer requiring screening colonoscopy    Referral to GI for colonoscopy ,  Needed by the end of year.  Will start with Dr Candace Cruise who did her last screening        Relevant Orders   Ambulatory referral to Gastroenterology   Encounter for preventive health examination - Primary    Annual comprehensive preventive exam was done .  During the course of the visit the patient was educated and counseled about appropriate screening and preventive services including :  diabetes screening, lipid analysis with projected  10 year  risk for CAD  Which is < 4% using the Framingham risk calculator for women, , nutrition counseling, breast and colorectal cancer screening, and recommended immunizations.  Printed recommendations for health maintenance screenings was given.   Lab Results  Component Value Date   CHOL 213* 07/11/2015   HDL 84.20 07/11/2015   LDLCALC 120* 07/11/2015   TRIG 43.0 07/11/2015   CHOLHDL 3 07/11/2015        Dyspepsia  Resolved,  Did not tolerate omeprazole         Other Visit Diagnoses    Breast cancer screening        Relevant Orders    MM DIGITAL SCREENING BILATERAL    Skin cancer screening        Relevant Orders    Ambulatory referral to Dermatology       I have discontinued Ms. Toepfer's predniSONE. I am also having her maintain her Vitamin D3, naproxen sodium, KRILL OIL PO, Cyanocobalamin, Cranberry, aspirin, multivitamin with minerals, vitamin C, diphenhydrAMINE, Probiotic Product (PROBIOTIC DAILY PO), and omeprazole.  No orders of the defined types were placed in this encounter.    Medications Discontinued During This Encounter  Medication Reason  . predniSONE (STERAPRED UNI-PAK) 10 MG tablet      Follow-up: No Follow-up on file.   Crecencio Mc, MD

## 2015-07-12 NOTE — Patient Instructions (Signed)
You are due for :  Mammogram colonoscopy Annual skin check  I will get these set up for you  Stop the iron supplements,  We can check yoru irone stores and CBC in 6 months if you like  I'm glad your indigestion has resolved,  If it recurs,  You can tryy  pepcid 20 mg daily (availabel OTC as famotidine).     This is  my version of a  "Low GI"  Weight loss Diet:  It has enabled many of my patients to lose up to 10 lbs per month depending on how much you restrict your carbs.   follow it carefully.  The idea behind low carb diets is that your body has to take the fat and protein in your food and turn it into an energy that is less efficient than carbohydrates, so you lose weight.    I have listed several choices at each of your 6 meal times to make it easy to shop.and plan    Remember that snack Substitutions should be equal to or less than 5 NET carbs per serving and  The only carbs in meals come from the pasta or vegetables so, they should be < 15 net carbs. Remember that carbohydrates from fiber do not count, so you can  subtract fiber grams to get the "net carbs " of any particular food item.    All of the foods can be found at grocery stores and in bulk at [employer].  The Atkins protein bars and shakes are available in more varieties at [employer], [employer] and [place].     7 AM Breakfast:  Choose from the following:  Low carbohydrate Protein  Shakes (I recommend the EAS AdvantEdge "Carb Control" shakes, Atkins,  Muscle Milk or Premier Protein shakes  All are < 4  carbs . My favorite is the premier protein chocolate   a scrambled egg/bacon/cheese burrito made with Mission's "carb balance" whole wheat tortilla  (this particular tortilla has only 6 net carbs, once you subtract the fiber grams  )  A slice of home made fritatta (egg based dish without a crust:  google it)    Avoid cereal and bananas, oatmeal and cream of wheat and grits. They are loaded with carbohydrates and have too few fiber  grams    10 AM: high protein snack  Protein bar by Atkins  Or KIND  (the lw sugar variety,  Not all are low , under 200 cal, usually < 6 net carbs).    A stick of cheese:  Around 1 carb,  100 cal      Other so called "protein bars" and Greek yogurts tend to be loaded with carbohydrates.  Remember, in food advertising, the word "energy" is synonymous for " carbohydrate."  Lunch:   A Sandwich using the bread choices listed below,  You  Can use any  Eggs,  lunchmeat, grilled meat or canned tuna), avocado, regular mayo/mustard  and cheese.  A Salad using blue cheese, ranch,  Goddess or vinagrette,  No croutons or "confetti" and no "candied nuts" but regular nuts OK. No "fat free": they add sugar for taste  No pretzels or chips.  Pickles and miniature sweet peppers are a good low carb alternative that provide a "crunch"   Please Note:  The bread is the only source of carbohydrate in a sandwich and  can be decreased by trying some of these alternatives to traditional loaf bread. Kristopher Oppenheim has the best selection:  Joseph's makes a pita bread and a flat ("Lavash")  bread that are 50 cal and 4 net carbs and are available at [place] and [place].  These can be toasted to make them taste better,  And can be  used as a pita chip to use with hummous as well  Toufayan makes a low carb flatbread that's 100 cal and 9 net carbs available at [employer] and [employer]'s makes 2 sizes of  Low carb whole wheat tortilla  (The large one is 210 cal and 6 net carbs, the small is 120 cal and also 6 net carbs)  Flat Out makes flatbreads that are low carb as well . They're called "Fold Its")   Avoid "Low fat dressings, as well as Barry Brunner and [place] dressings They are loaded with sugar!   3 PM/ Mid day  Snack:  Consider  1 ounce of  almonds, walnuts, pistachios, pecans, peanuts,  Macadamia nuts or a nut medley are ok .  Avoid "granola"; the dried cranberries and raisins are loaded with carbohydrates. Mixed nuts  as long as there are no raisins,  cranberries or dried fruit.    Try the prosciutto/mozzarella cheese sticks by [employer]  In deli /backery section   High protein 80 cal each , 1 carb   To avoid overindulging in snacks: Try drinking a glass of unsweeted almond/coconut milk  Or a cup of coffee with your Atkins chocolate bar to keep you from having 3!!!   Pork rinds!  Yes Pork Rinds  Are on the diet .  They are your potato chip.        6 PM  Dinner:     Meat/fowl/fish with a green salad, with steamed/grilled/sauteed vegggie: broccoli, cauliflower, green beans, spinach, brussel sprouts or  Lima beans. DO NOT BREAD THE PROTEIN!!      There is a low carb pasta by Dreamfield's that is acceptable and tastes great: only 5 digestible carbs/serving.( All grocery stores but BJs carry it )  Try Hurley Cisco Angelo's chicken piccata or chicken or eggplant parm over low carb pasta.(Lowes and BJs)   Marjory Lies Sanchez's "Carnitas" (pulled pork, no sauce,  0 carbs) can be used to make a dinner  burrito (available at BJ's ) and his pot roast is also without veggies or potatoes   Pesto over low carb pasta (bj's sells a good quality pesto in the center refrigerated section of the deli   Try satueeing  Cheral Marker with mushrooms as another side dish   Whole wheat pasta is still full of digestible carbs and  Not as low in glycemic index as Dreamfield's.   Brown rice is still rice,  So skip the rice and noodles if you eat [place] or [place] (or at least limit to 1 cooked cup)  9 PM snack :   Breyer's "low carb" fudgsicle or  ice cream bar (Carb Smart line), or  Weight Watcher's ice cream bar , or another "no sugar added" ice cream;  a serving of fresh berries/cherries with whipped cream   Cheese or DANNON'S LlG[employer] GREEK YOGURT or the [place] greek yogurt   8 ounces of [place] unsweetened almond/cococunut milk  Cheese and crackers (using WASA crackers,  They are low carb) or peanut butter on low carb crackers or pita bread      Avoid bananas, pineapple, grapes  and watermelon on a regular basis because they are high in sugar.  THINK OF THEM AS DESSERT. Stick to" berries  and cherries"   Menopause is a normal process in which your reproductive ability comes to an end. This process happens gradually over a span of months to years, usually between the ages of 77 and 85. Menopause is complete when you have missed 12 consecutive menstrual periods. It is important to talk with your health care provider about some of the most common conditions that affect postmenopausal women, such as heart disease, cancer, and bone loss (osteoporosis). Adopting a healthy lifestyle and getting preventive care can help to promote your health and wellness. Those actions can also lower your chances of developing some of these common conditions. WHAT SHOULD I KNOW ABOUT MENOPAUSE? During menopause, you may experience a number of symptoms, such as:  Moderate-to-severe hot flashes.  Night sweats.  Decrease in sex drive.  Mood swings.  Headaches.  Tiredness.  Irritability.  Memory problems.  Insomnia. Choosing to treat or not to treat menopausal changes is an individual decision that you make with your health care provider. WHAT SHOULD I KNOW ABOUT HORMONE REPLACEMENT THERAPY AND SUPPLEMENTS? Hormone therapy products are effective for treating symptoms that are associated with menopause, such as hot flashes and night sweats. Hormone replacement carries certain risks, especially as you become older. If you are thinking about using estrogen or estrogen with progestin treatments, discuss the benefits and risks with your health care provider. WHAT SHOULD I KNOW ABOUT HEART DISEASE AND STROKE? Heart disease, heart attack, and stroke become more likely as you age. This may be due, in part, to the hormonal changes that your body experiences during menopause. These can affect how your body processes dietary fats, triglycerides, and cholesterol.  Heart attack and stroke are both medical emergencies. There are many things that you can do to help prevent heart disease and stroke:  Have your blood pressure checked at least every 1-2 years. High blood pressure causes heart disease and increases the risk of stroke.  If you are 41-39 years old, ask your health care provider if you should take aspirin to prevent a heart attack or a stroke.  Do not use any tobacco products, including cigarettes, chewing tobacco, or electronic cigarettes. If you need help quitting, ask your health care provider.  It is important to eat a healthy diet and maintain a healthy weight.  Be sure to include plenty of vegetables, fruits, low-fat dairy products, and lean protein.  Avoid eating foods that are high in solid fats, added sugars, or salt (sodium).  Get regular exercise. This is one of the most important things that you can do for your health.  Try to exercise for at least 150 minutes each week. The type of exercise that you do should increase your heart rate and make you sweat. This is known as moderate-intensity exercise.  Try to do strengthening exercises at least twice each week. Do these in addition to the moderate-intensity exercise.  Know your numbers.Ask your health care provider to check your cholesterol and your blood glucose. Continue to have your blood tested as directed by your health care provider. WHAT SHOULD I KNOW ABOUT CANCER SCREENING? There are several types of cancer. Take the following steps to reduce your risk and to catch any cancer development as early as possible. Breast Cancer  Practice breast self-awareness.  This means understanding how your breasts normally appear and feel.  It also means doing regular breast self-exams. Let your health care provider know about any changes, no matter how small.  If you are 40 or older, have  a clinician do a breast exam (clinical breast exam or CBE) every year. Depending on your age,  family history, and medical history, it may be recommended that you also have a yearly breast X-ray (mammogram).  If you have a family history of breast cancer, talk with your health care provider about genetic screening.  If you are at high risk for breast cancer, talk with your health care provider about having an MRI and a mammogram every year.  Breast cancer (BRCA) gene test is recommended for women who have family members with BRCA-related cancers. Results of the assessment will determine the need for genetic counseling and BRCA1 and for BRCA2 testing. BRCA-related cancers include these types:  Breast. This occurs in males or females.  Ovarian.  Tubal. This may also be called fallopian tube cancer.  Cancer of the abdominal or pelvic lining (peritoneal cancer).  Prostate.  Pancreatic. Cervical, Uterine, and Ovarian Cancer Your health care provider may recommend that you be screened regularly for cancer of the pelvic organs. These include your ovaries, uterus, and vagina. This screening involves a pelvic exam, which includes checking for microscopic changes to the surface of your cervix (Pap test).  For women ages 21-65, health care providers may recommend a pelvic exam and a Pap test every three years. For women ages 42-65, they may recommend the Pap test and pelvic exam, combined with testing for human papilloma virus (HPV), every five years. Some types of HPV increase your risk of cervical cancer. Testing for HPV may also be done on women of any age who have unclear Pap test results.  Other health care providers may not recommend any screening for nonpregnant women who are considered low risk for pelvic cancer and have no symptoms. Ask your health care provider if a screening pelvic exam is right for you.  If you have had past treatment for cervical cancer or a condition that could lead to cancer, you need Pap tests and screening for cancer for at least 20 years after your treatment.  If Pap tests have been discontinued for you, your risk factors (such as having a new sexual partner) need to be reassessed to determine if you should start having screenings again. Some women have medical problems that increase the chance of getting cervical cancer. In these cases, your health care provider may recommend that you have screening and Pap tests more often.  If you have a family history of uterine cancer or ovarian cancer, talk with your health care provider about genetic screening.  If you have vaginal bleeding after reaching menopause, tell your health care provider.  There are currently no reliable tests available to screen for ovarian cancer. Lung Cancer Lung cancer screening is recommended for adults 29-40 years old who are at high risk for lung cancer because of a history of smoking. A yearly low-dose CT scan of the lungs is recommended if you:  Currently smoke.  Have a history of at least 30 pack-years of smoking and you currently smoke or have quit within the past 15 years. A pack-year is smoking an average of one pack of cigarettes per day for one year. Yearly screening should:  Continue until it has been 15 years since you quit.  Stop if you develop a health problem that would prevent you from having lung cancer treatment. Colorectal Cancer  This type of cancer can be detected and can often be prevented.  Routine colorectal cancer screening usually begins at age 15 and continues through age 94.  If you have risk factors for colon cancer, your health care provider may recommend that you be screened at an earlier age.  If you have a family history of colorectal cancer, talk with your health care provider about genetic screening.  Your health care provider may also recommend using home test kits to check for hidden blood in your stool.  A small camera at the end of a tube can be used to examine your colon directly (sigmoidoscopy or colonoscopy). This is done to check  for the earliest forms of colorectal cancer.  Direct examination of the colon should be repeated every 5-10 years until age 49. However, if early forms of precancerous polyps or small growths are found or if you have a family history or genetic risk for colorectal cancer, you may need to be screened more often. Skin Cancer  Check your skin from head to toe regularly.  Monitor any moles. Be sure to tell your health care provider:  About any new moles or changes in moles, especially if there is a change in a mole's shape or color.  If you have a mole that is larger than the size of a pencil eraser.  If any of your family members has a history of skin cancer, especially at a young age, talk with your health care provider about genetic screening.  Always use sunscreen. Apply sunscreen liberally and repeatedly throughout the day.  Whenever you are outside, protect yourself by wearing long sleeves, pants, a wide-brimmed hat, and sunglasses. WHAT SHOULD I KNOW ABOUT OSTEOPOROSIS? Osteoporosis is a condition in which bone destruction happens more quickly than new bone creation. After menopause, you may be at an increased risk for osteoporosis. To help prevent osteoporosis or the bone fractures that can happen because of osteoporosis, the following is recommended:  If you are 67-76 years old, get at least 1,000 mg of calcium and at least 600 mg of vitamin D per day.  If you are older than age 41 but younger than age 24, get at least 1,200 mg of calcium and at least 600 mg of vitamin D per day.  If you are older than age 41, get at least 1,200 mg of calcium and at least 800 mg of vitamin D per day. Smoking and excessive alcohol intake increase the risk of osteoporosis. Eat foods that are rich in calcium and vitamin D, and do weight-bearing exercises several times each week as directed by your health care provider. WHAT SHOULD I KNOW ABOUT HOW MENOPAUSE AFFECTS [place]? Depression may occur  at any age, but it is more common as you become older. Common symptoms of depression include:  Low or sad mood.  Changes in sleep patterns.  Changes in appetite or eating patterns.  Feeling an overall lack of motivation or enjoyment of activities that you previously enjoyed.  Frequent crying spells. Talk with your health care provider if you think that you are experiencing depression. WHAT SHOULD I KNOW ABOUT IMMUNIZATIONS? It is important that you get and maintain your immunizations. These include:  Tetanus, diphtheria, and pertussis (Tdap) booster vaccine.  Influenza every year before the flu season begins.  Pneumonia vaccine.  Shingles vaccine. Your health care provider may also recommend other immunizations.   This information is not intended to replace advice given to you by your health care provider. Make sure you discuss any questions you have with your health care provider.   Document Released: 10/05/2005 Document Revised: 09/03/2014 Document Reviewed: 04/15/2014 Elsevier Interactive Patient  Education 2016 [employer].

## 2015-07-14 ENCOUNTER — Encounter: Payer: Self-pay | Admitting: Internal Medicine

## 2015-07-14 NOTE — Assessment & Plan Note (Signed)
Referral to GI for colonoscopy ,  Needed by the end of year.  Will start with Dr Candace Cruise who did her last screening

## 2015-07-14 NOTE — Assessment & Plan Note (Signed)
Resolved,  Did not tolerate omeprazole

## 2015-09-05 ENCOUNTER — Ambulatory Visit
Admission: RE | Admit: 2015-09-05 | Payer: BLUE CROSS/BLUE SHIELD | Source: Ambulatory Visit | Admitting: Gastroenterology

## 2015-09-05 ENCOUNTER — Encounter: Admission: RE | Payer: Self-pay | Source: Ambulatory Visit

## 2015-09-05 SURGERY — COLONOSCOPY WITH PROPOFOL
Anesthesia: General

## 2015-09-08 ENCOUNTER — Other Ambulatory Visit: Payer: Self-pay | Admitting: Internal Medicine

## 2015-09-08 DIAGNOSIS — Z1239 Encounter for other screening for malignant neoplasm of breast: Secondary | ICD-10-CM

## 2015-11-07 ENCOUNTER — Encounter: Payer: Self-pay | Admitting: Internal Medicine

## 2015-11-08 NOTE — Telephone Encounter (Signed)
FYI

## 2015-12-21 ENCOUNTER — Ambulatory Visit
Admission: RE | Admit: 2015-12-21 | Discharge: 2015-12-21 | Disposition: A | Discharge status: Home / Self Care | Payer: BLUE CROSS/BLUE SHIELD | Source: Ambulatory Visit | Attending: Internal Medicine | Admitting: Internal Medicine

## 2015-12-21 ENCOUNTER — Other Ambulatory Visit: Payer: Self-pay | Admitting: Internal Medicine

## 2015-12-21 DIAGNOSIS — Z1239 Encounter for other screening for malignant neoplasm of breast: Secondary | ICD-10-CM

## 2015-12-27 ENCOUNTER — Encounter: Payer: Self-pay | Admitting: *Deleted

## 2016-01-02 NOTE — Discharge Instructions (Signed)

## 2016-01-03 ENCOUNTER — Encounter
Admission: RE | Disposition: A | Discharge status: Home / Self Care | Payer: Self-pay | Source: Ambulatory Visit | Attending: Gastroenterology

## 2016-01-03 ENCOUNTER — Ambulatory Visit: Payer: BLUE CROSS/BLUE SHIELD | Admitting: Anesthesiology

## 2016-01-03 ENCOUNTER — Ambulatory Visit
Admission: RE | Admit: 2016-01-03 | Discharge: 2016-01-03 | Disposition: A | Discharge status: Home / Self Care | Payer: BLUE CROSS/BLUE SHIELD | Source: Ambulatory Visit | Attending: Gastroenterology | Admitting: Gastroenterology

## 2016-01-03 DIAGNOSIS — Z8349 Family history of other endocrine, nutritional and metabolic diseases: Secondary | ICD-10-CM | POA: Diagnosis not present

## 2016-01-03 DIAGNOSIS — Z833 Family history of diabetes mellitus: Secondary | ICD-10-CM | POA: Diagnosis not present

## 2016-01-03 DIAGNOSIS — Z881 Allergy status to other antibiotic agents status: Secondary | ICD-10-CM | POA: Diagnosis not present

## 2016-01-03 DIAGNOSIS — Z7982 Long term (current) use of aspirin: Secondary | ICD-10-CM | POA: Insufficient documentation

## 2016-01-03 DIAGNOSIS — Z8249 Family history of ischemic heart disease and other diseases of the circulatory system: Secondary | ICD-10-CM | POA: Insufficient documentation

## 2016-01-03 DIAGNOSIS — Z9889 Other specified postprocedural states: Secondary | ICD-10-CM | POA: Diagnosis not present

## 2016-01-03 DIAGNOSIS — Z791 Long term (current) use of non-steroidal anti-inflammatories (NSAID): Secondary | ICD-10-CM | POA: Insufficient documentation

## 2016-01-03 DIAGNOSIS — Z800 Family history of malignant neoplasm of digestive organs: Secondary | ICD-10-CM | POA: Insufficient documentation

## 2016-01-03 DIAGNOSIS — Z1211 Encounter for screening for malignant neoplasm of colon: Secondary | ICD-10-CM | POA: Diagnosis present

## 2016-01-03 DIAGNOSIS — K219 Gastro-esophageal reflux disease without esophagitis: Secondary | ICD-10-CM | POA: Insufficient documentation

## 2016-01-03 DIAGNOSIS — K5730 Diverticulosis of large intestine without perforation or abscess without bleeding: Secondary | ICD-10-CM | POA: Insufficient documentation

## 2016-01-03 DIAGNOSIS — M1990 Unspecified osteoarthritis, unspecified site: Secondary | ICD-10-CM | POA: Insufficient documentation

## 2016-01-03 HISTORY — DX: Gastro-esophageal reflux disease without esophagitis: K21.9

## 2016-01-03 HISTORY — DX: Presence of spectacles and contact lenses: Z97.3

## 2016-01-03 HISTORY — PX: COLONOSCOPY: SHX5424

## 2016-01-03 HISTORY — DX: Unspecified osteoarthritis, unspecified site: M19.90

## 2016-01-03 LAB — HM COLONOSCOPY

## 2016-01-03 SURGERY — COLONOSCOPY
Anesthesia: Monitor Anesthesia Care

## 2016-01-03 MED ORDER — LIDOCAINE HCL (CARDIAC) 20 MG/ML IV SOLN
INTRAVENOUS | Status: DC | PRN
  Administered 2016-01-03: 30 mg via INTRAVENOUS

## 2016-01-03 MED ORDER — STERILE WATER FOR IRRIGATION IR SOLN
Status: DC | PRN
  Administered 2016-01-03: 11:00:00

## 2016-01-03 MED ORDER — PROPOFOL 10 MG/ML IV BOLUS
INTRAVENOUS | Status: DC | PRN
  Administered 2016-01-03: 50 mg via INTRAVENOUS
  Administered 2016-01-03: 100 mg via INTRAVENOUS
  Administered 2016-01-03: 50 mg via INTRAVENOUS

## 2016-01-03 MED ORDER — LACTATED RINGERS IV SOLN
INTRAVENOUS | Status: DC
  Administered 2016-01-03: 10:00:00 via INTRAVENOUS

## 2016-01-03 SURGICAL SUPPLY — 30 items
CANISTER SUCT 1200ML W/VALVE (MISCELLANEOUS) ×2 IMPLANT
FCP ESCP3.2XJMB 240X2.8X (MISCELLANEOUS)
FORCEPS BIOP RAD 4 LRG CAP 4 (CUTTING FORCEPS) IMPLANT
FORCEPS BIOP RJ4 240 W/NDL (MISCELLANEOUS)
FORCEPS ESCP3.2XJMB 240X2.8X (MISCELLANEOUS) IMPLANT
GOWN CVR UNV OPN BCK APRN NK (MISCELLANEOUS) ×1 IMPLANT
GOWN ISOL THUMB LOOP REG UNIV (MISCELLANEOUS) ×1
GOWN STRL REUS W/ TWL LRG LVL3 (GOWN DISPOSABLE) ×1 IMPLANT
GOWN STRL REUS W/TWL LRG LVL3 (GOWN DISPOSABLE) ×1
HEMOCLIP INSTINCT (CLIP) IMPLANT
INJECTOR VARIJECT VIN23 (MISCELLANEOUS) IMPLANT
KIT CO2 TUBING (TUBING) IMPLANT
KIT DEFENDO VALVE AND CONN (KITS) IMPLANT
KIT ENDO PROCEDURE OLY (KITS) ×2 IMPLANT
LIGATOR MULTIBAND 6SHOOTER MBL (MISCELLANEOUS) IMPLANT
MARKER SPOT ENDO TATTOO 5ML (MISCELLANEOUS) IMPLANT
PAD GROUND ADULT SPLIT (MISCELLANEOUS) IMPLANT
SNARE SHORT THROW 13M SML OVAL (MISCELLANEOUS) IMPLANT
SNARE SHORT THROW 30M LRG OVAL (MISCELLANEOUS) IMPLANT
SPOT EX ENDOSCOPIC TATTOO (MISCELLANEOUS)
SUCTION POLY TRAP 4CHAMBER (MISCELLANEOUS) IMPLANT
TRAP SUCTION POLY (MISCELLANEOUS) IMPLANT
TUBING CONN 6MMX3.1M (TUBING)
TUBING SUCTION CONN 0.25 STRL (TUBING) IMPLANT
UNDERPAD 30X60 958B10 (PK) (MISCELLANEOUS) IMPLANT
VALVE BIOPSY ENDO (VALVE) IMPLANT
VARIJECT INJECTOR VIN23 (MISCELLANEOUS)
WATER AUXILLARY (MISCELLANEOUS) IMPLANT
WATER STERILE IRR 250ML POUR (IV SOLUTION) IMPLANT
WATER STERILE IRR 500ML POUR (IV SOLUTION) IMPLANT

## 2016-01-03 NOTE — H&P (Signed)
Primary Care Physician:  Crecencio Mc, MD Primary Gastroenterologist:  Dr. Candace Cruise  Pre-Procedure History & Physical: HPI:  Kathy [name] is a 60 y.o. female is here for an colonoscopy   Past Medical History  Diagnosis Date  . Arthritis     mild  . GERD (gastroesophageal reflux disease)     occasional  . Wears contact lenses     Past Surgical History  Procedure Laterality Date  . Fatty tissue under bilateral arm    . Colonoscopy  2006    Prior to Admission medications   Medication Sig Start Date End Date Taking? Authorizing Provider  Ascorbic Acid (VITAMIN C) 1000 MG tablet Take 1,000 mg by mouth daily.   Yes Historical Provider, MD  aspirin 81 MG tablet Take 81 mg by mouth daily.   Yes Historical Provider, MD  Cranberry (SM CRANBERRY) 300 MG tablet Take 300 mg by mouth daily.   Yes Historical Provider, MD  Cyanocobalamin 5000 MCG SUBL Place 1 tablet under the tongue daily.   Yes Historical Provider, MD  diphenhydrAMINE (BENADRYL) 25 MG tablet Take 25 mg by mouth every 6 (six) hours as needed.   Yes Historical Provider, MD  KRILL OIL PO Take 1 capsule by mouth daily.   Yes Historical Provider, MD  Multiple Vitamins-Minerals (MULTIVITAMIN WITH MINERALS) tablet Take 1 tablet by mouth 2 (two) times daily.   Yes Historical Provider, MD  naproxen sodium (ANAPROX) 220 MG tablet Take 220 mg by mouth daily.   Yes Historical Provider, MD  Probiotic Product (PROBIOTIC DAILY PO) Take by mouth daily.   Yes Historical Provider, MD  Cholecalciferol (VITAMIN D3) 10000 UNITS capsule Take 10,000 Units by mouth daily. Reported on 12/27/2015    Historical Provider, MD    Allergies as of 12/20/2015 - Review Complete 09/02/2015  Allergen Reaction Noted  . Neosporin [neomycin-bacitracin zn-polymyx] Rash 11/16/2013    Family History  Problem Relation Age of Onset  . Hyperlipidemia Mother   . Hypertension Mother   . Diabetes Mother   . Cancer Father 40    colon  . Breast cancer Sister 65     Social History   Social History  . Marital Status: Married    Spouse Name: N/A  . Number of Children: N/A  . Years of Education: N/A   Occupational History  . Not on file.   Social History Main Topics  . Smoking status: Never Smoker   . Smokeless tobacco: Never Used  . Alcohol Use: 6.0 oz/week    10 Cans of beer per week     Comment:    . Drug Use: No  . Sexual Activity: No   Other Topics Concern  . Not on file   Social History Narrative    Review of Systems: See HPI, otherwise negative ROS  Physical Exam: BP 131/87 mmHg  Pulse 60  Temp(Src) 97.7 F (36.5 C) (Temporal)  Resp 16  Ht 5\' 6"  (1.676 m)  Wt 182 lb (82.555 kg)  BMI 29.39 kg/m2  SpO2 100% General:   Alert,  pleasant and cooperative in NAD Head:  Normocephalic and atraumatic. Neck:  Supple; no masses or thyromegaly. Lungs:  Clear throughout to auscultation.    Heart:  Regular rate and rhythm. Abdomen:  Soft, nontender and nondistended. Normal bowel sounds, without guarding, and without rebound.   Neurologic:  Alert and  oriented x4;  grossly normal neurologically.  Impression/Plan: Kathy [name] is here for an colonoscopy to be performed for  screening, family hx of colon cancer  Risks, benefits, limitations, and alternatives regarding  colonoscopy} have been reviewed with the patient.  Questions have been answered.  All parties agreeable.   Almeter Westhoff, Lupita Dawn, MD  01/03/2016, 9:59 AM

## 2016-01-03 NOTE — Op Note (Signed)
[facility] Gastroenterology Patient Name: Kathy [name] Procedure Date: 01/03/2016 11:00 AM MRN: [id number] Account #: [id number] Date of Birth: 1956/07/07 Admit Type: Outpatient Age: 60 Room: [facility] OR ROOM 01 Gender: Female Note Status: Finalized Procedure:            Colonoscopy Indications:          Screening in patient at increased risk: Family history                        of 1st-degree relative with colorectal cancer Providers:            Lupita Dawn. Candace Cruise, MD Referring MD:         Deborra Medina, MD (Referring MD) Medicines:            Monitored Anesthesia Care Complications:        No immediate complications. Procedure:            Pre-Anesthesia Assessment:                       - Prior to the procedure, a History and Physical was                        performed, and patient medications, allergies and                        sensitivities were reviewed. The patient's tolerance of                        previous anesthesia was reviewed.                       - The risks and benefits of the procedure and the                        sedation options and risks were discussed with the                        patient. All questions were answered and informed                        consent was obtained.                       - After reviewing the risks and benefits, the patient                        was deemed in satisfactory condition to undergo the                        procedure.                       After obtaining informed consent, the colonoscope was                        passed under direct vision. Throughout the procedure,                        the patient's blood pressure, pulse, and oxygen  saturations were monitored continuously. The Olympus CF                        H180AL colonoscope (S#: [id number]) was introduced through                        the anus and advanced to the the cecum, identified by                        appendiceal  orifice and ileocecal valve. The                        colonoscopy was performed without difficulty. The                        patient tolerated the procedure well. The quality of                        the bowel preparation was good. Findings:      Multiple small and large-mouthed diverticula were found in the sigmoid       colon.      The exam was otherwise without abnormality. Impression:           - Diverticulosis in the sigmoid colon.                       - The examination was otherwise normal.                       - No specimens collected. Recommendation:       - Discharge patient to home.                       - Repeat colonoscopy in 5 years for surveillance.                       - The findings and recommendations were discussed with                        the patient. Procedure Code(s):    --- Professional ---                       [phone], Colonoscopy, flexible; diagnostic, including                        collection of specimen(s) by brushing or washing, when                        performed (separate procedure) Diagnosis Code(s):    --- Professional ---                       Z80.0, Family history of malignant neoplasm of                        digestive organs                       K57.30, Diverticulosis of large intestine without                        perforation or abscess without bleeding  CPT copyright 2016 American Medical Association. All rights reserved. The codes documented in this report are preliminary and upon coder review may  be revised to meet current compliance requirements. Hulen Luster, MD 01/03/2016 11:22:34 AM This report has been signed electronically. Number of Addenda: 0 Note Initiated On: 01/03/2016 11:00 AM Scope Withdrawal Time: 0 hours 9 minutes 51 seconds  Total Procedure Duration: 0 hours 13 minutes 9 seconds       [facility]

## 2016-01-03 NOTE — Anesthesia Postprocedure Evaluation (Signed)
Anesthesia Post Note  Patient: Kathy [name]  Procedure(s) Performed: Procedure(s) (LRB): COLONOSCOPY (N/A)  Patient location during evaluation: PACU Anesthesia Type: MAC Level of consciousness: awake and alert and oriented Pain management: pain level controlled Vital Signs Assessment: post-procedure vital signs reviewed and stable Respiratory status: spontaneous breathing and nonlabored ventilation Cardiovascular status: stable Postop Assessment: no signs of nausea or vomiting and adequate PO intake Anesthetic complications: no    Estill Batten

## 2016-01-03 NOTE — Transfer of Care (Signed)
Immediate Anesthesia Transfer of Care Note  Patient: Kathy [name]  Procedure(s) Performed: Procedure(s): COLONOSCOPY (N/A)  Patient Location: PACU  Anesthesia Type: MAC  Level of Consciousness: awake, alert  and patient cooperative  Airway and Oxygen Therapy: Patient Spontanous Breathing and Patient connected to supplemental oxygen  Post-op Assessment: Post-op Vital signs reviewed, Patient's Cardiovascular Status Stable, Respiratory Function Stable, Patent Airway and No signs of Nausea or vomiting  Post-op Vital Signs: Reviewed and stable  Complications: No apparent anesthesia complications

## 2016-01-03 NOTE — Anesthesia Procedure Notes (Signed)
Procedure Name: MAC Performed by: Domanique Luckett Pre-anesthesia Checklist: Patient identified, Emergency Drugs available, Suction available, Timeout performed and Patient being monitored Patient Re-evaluated:Patient Re-evaluated prior to inductionOxygen Delivery Method: Nasal cannula Placement Confirmation: positive ETCO2     

## 2016-01-03 NOTE — Anesthesia Preprocedure Evaluation (Signed)
Anesthesia Evaluation  Patient identified by MRN, date of birth, ID band Patient awake    Reviewed: Allergy & Precautions, NPO status , Patient's Chart, lab work & pertinent test results  Airway Mallampati: II  TM Distance: >3 FB Neck ROM: Full    Dental no notable dental hx.    Pulmonary neg pulmonary ROS,           Cardiovascular negative cardio ROS Normal cardiovascular exam     Neuro/Psych negative neurological ROS  negative psych ROS   GI/Hepatic Neg liver ROS, GERD  Controlled,  Endo/Other  negative endocrine ROS  Renal/GU negative Renal ROS     Musculoskeletal  (+) Arthritis , Osteoarthritis,    Abdominal   Peds negative pediatric ROS (+)  Hematology negative hematology ROS (+)   Anesthesia Other Findings   Reproductive/Obstetrics                             Anesthesia Physical Anesthesia Plan  ASA: II  Anesthesia Plan: MAC   Post-op Pain Management:    Induction:   Airway Management Planned:   Additional Equipment:   Intra-op Plan:   Post-operative Plan:   Informed Consent: I have reviewed the patients History and Physical, chart, labs and discussed the procedure including the risks, benefits and alternatives for the proposed anesthesia with the patient or authorized representative who has indicated his/her understanding and acceptance.     Plan Discussed with: CRNA  Anesthesia Plan Comments:         Anesthesia Quick Evaluation

## 2016-01-04 ENCOUNTER — Encounter: Payer: Self-pay | Admitting: Gastroenterology

## 2016-01-17 ENCOUNTER — Encounter: Payer: Self-pay | Admitting: Internal Medicine

## 2017-03-26 ENCOUNTER — Telehealth: Payer: Self-pay | Admitting: Internal Medicine

## 2017-03-26 DIAGNOSIS — R7301 Impaired fasting glucose: Secondary | ICD-10-CM

## 2017-03-26 DIAGNOSIS — E785 Hyperlipidemia, unspecified: Secondary | ICD-10-CM

## 2017-03-26 DIAGNOSIS — R5383 Other fatigue: Secondary | ICD-10-CM

## 2017-03-26 NOTE — Telephone Encounter (Signed)
Pt called and wanted to know if she needed to have labs done before her 05/13/17 appt. Please advise, thank you!  Call pt @ [phone] (may leave msg)

## 2017-03-27 NOTE — Telephone Encounter (Addendum)
Patient needs fasting lab appointment. Before 05/13/17 appointment please schedule.

## 2017-03-27 NOTE — Telephone Encounter (Signed)
Patient has not seen by PCP since 11/16 but has scheduled with PCP in 9/18 would you like labs prior to visit?

## 2017-03-27 NOTE — Telephone Encounter (Signed)
Yes, ordered.

## 2017-03-29 NOTE — Telephone Encounter (Signed)
Lm on vm to call office to schedule fasting labs.

## 2017-04-11 ENCOUNTER — Other Ambulatory Visit: Payer: Self-pay | Admitting: Internal Medicine

## 2017-05-06 ENCOUNTER — Encounter: Payer: Self-pay | Admitting: Internal Medicine

## 2017-05-06 ENCOUNTER — Ambulatory Visit (INDEPENDENT_AMBULATORY_CARE_PROVIDER_SITE_OTHER): Payer: BLUE CROSS/BLUE SHIELD | Admitting: Internal Medicine

## 2017-05-06 ENCOUNTER — Ambulatory Visit (INDEPENDENT_AMBULATORY_CARE_PROVIDER_SITE_OTHER): Payer: BLUE CROSS/BLUE SHIELD

## 2017-05-06 VITALS — BP 146/82 | HR 74 | Temp 98.3°F | Resp 16 | Ht 66.0 in | Wt 191.2 lb

## 2017-05-06 DIAGNOSIS — L089 Local infection of the skin and subcutaneous tissue, unspecified: Secondary | ICD-10-CM | POA: Diagnosis not present

## 2017-05-06 DIAGNOSIS — S40862S Insect bite (nonvenomous) of left upper arm, sequela: Secondary | ICD-10-CM

## 2017-05-06 DIAGNOSIS — R200 Anesthesia of skin: Secondary | ICD-10-CM

## 2017-05-06 DIAGNOSIS — W57XXXS Bitten or stung by nonvenomous insect and other nonvenomous arthropods, sequela: Secondary | ICD-10-CM

## 2017-05-06 NOTE — Progress Notes (Signed)
Subjective:  Patient ID: Francesca Oman, female    DOB: 05-02-1956  Age: 61 y.o. MRN: [id number]  CC: The primary encounter diagnosis was Numbness of left hand. A diagnosis of Insect bite of arm, left, infected, sequela was also pertinent to this visit.  HPI QUINLEE SCIARRA presents for new onset neuropathy involving the left hand.  Patient reports that her left hand frequently feels numb and invlvoes all five fingers and palm up to the wrist.  The sensation is intermittent and is brought on by lyingn down  on her right side. ,  Otherwise occurs intermittently .  Has been ocurring  For the  2 weeks .  Denies neck and elbow pain.  Had some kind of insect bite on the elbow  one month ago,   Made the left upper arm swell from elbow to shoulder, to the point that she was unable to bend elbow . Did not think it was  bee sting .  No medical evaluation.  Used benadryl and topical steroid OTC ,  Resolved after one full week.  No fevers, nausea,  No anaphylactic reaction   No recent tick bites.   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      Outpatient Medications Prior to Visit  Medication Sig Dispense Refill  . Ascorbic Acid (VITAMIN C) 1000 MG tablet Take 1,000 mg by mouth daily.    Marland Kitchen aspirin 81 MG tablet Take 81 mg by mouth daily.    . Cholecalciferol (VITAMIN D3) 10000 UNITS capsule Take 10,000 Units by mouth daily. Reported on 12/27/2015    . Cranberry (SM CRANBERRY) 300 MG tablet Take 300 mg by mouth daily.    . Cyanocobalamin 5000 MCG SUBL Place 1 tablet under the tongue daily.    . diphenhydrAMINE (BENADRYL) 25 MG tablet Take 25 mg by mouth every 6 (six) hours as needed.    Marland Kitchen KRILL OIL PO Take 1 capsule by mouth daily.    . Multiple Vitamins-Minerals (MULTIVITAMIN WITH MINERALS) tablet Take 1 tablet by mouth 2 (two) times daily.    . naproxen sodium (ANAPROX) 220 MG tablet Take 220 mg by mouth daily.    .  Probiotic Product (PROBIOTIC DAILY PO) Take by mouth daily.     No facility-administered medications prior to visit.     Review of Systems;  Patient denies headache, fevers, malaise, unintentional weight loss, skin rash, eye pain, sinus congestion and sinus pain, sore throat, dysphagia,  hemoptysis , cough, dyspnea, wheezing, chest pain, palpitations, orthopnea, edema, abdominal pain, nausea, melena, diarrhea, constipation, flank pain, dysuria, hematuria, urinary  Frequency, nocturia, numbness, tingling, seizures,  Focal weakness, Loss of consciousness,  Tremor, insomnia, depression, anxiety, and suicidal ideation.      Objective:  BP (!) 146/82 (BP Location: Left Arm, Patient Position: Sitting, Cuff Size: Normal)   Pulse 74   Temp 98.3 F (36.8 C) (Oral)   Resp 16   Ht 5\' 6"  (1.676 m)   Wt 191 lb 3.2 oz (86.7 kg)   SpO2 97%   BMI 30.86 kg/m   BP Readings from Last 3 Encounters:  05/06/17 (!) 146/82  01/03/16 104/61  07/12/15 130/78    Wt Readings from Last 3 Encounters:  05/06/17 191 lb 3.2 oz (86.7 kg)  01/03/16 182 lb (82.6 kg)  07/12/15 188 lb (85.3 kg)    General appearance: alert, cooperative and appears stated  age Neck: no adenopathy, no carotid bruit, supple, symmetrical, trachea midline and thyroid not enlarged, symmetric, no tenderness/mass/nodules Back: symmetric, no curvature. ROM normal. No CVA tenderness. Lungs: clear to auscultation bilaterally Heart: regular rate and rhythm, S1, S2 normal, no murmur, click, rub or gallop Abdomen: soft, non-tender; bowel sounds normal; no masses,  no organomegaly Pulses: 2+ and symmetric Skin: Skin color, texture, turgor normal. No rashes or lesions MSK: strength 5/5 in U/E's.  Forearm musculature relatively overdeveloped ,  No thenar or hypothenar wasting.  Lymph nodes: Cervical, supraclavicular, and axillary nodes normal.  No results found for: HGBA1C  Lab Results  Component Value Date   CREATININE 0.75 07/11/2015    CREATININE 0.8 11/26/2013    Lab Results  Component Value Date   WBC 5.1 07/11/2015   HGB 12.9 07/11/2015   HCT 38.5 07/11/2015   PLT 333.0 07/11/2015   GLUCOSE 101 (H) 07/11/2015   CHOL 213 (H) 07/11/2015   TRIG 43.0 07/11/2015   HDL 84.20 07/11/2015   LDLCALC 120 (H) 07/11/2015   ALT 15 07/11/2015   AST 19 07/11/2015   NA 140 07/11/2015   K 3.8 07/11/2015   CL 102 07/11/2015   CREATININE 0.75 07/11/2015   BUN 22 07/11/2015   CO2 29 07/11/2015   TSH 2.67 07/11/2015    Mm Screening Breast Tomo Bilateral  Result Date: 12/21/2015 CLINICAL DATA:  Screening. EXAM: 2D DIGITAL SCREENING BILATERAL MAMMOGRAM WITH CAD AND ADJUNCT TOMO COMPARISON:  Previous exam(s). ACR Breast Density Category b: There are scattered areas of fibroglandular density. FINDINGS: There are no findings suspicious for malignancy. Images were processed with CAD. IMPRESSION: No mammographic evidence of malignancy. A result letter of this screening mammogram will be mailed directly to the patient. RECOMMENDATION: Screening mammogram in one year. (Code:SM-B-01Y) BI-RADS CATEGORY  1: Negative. Electronically Signed   By: Curlene Dolphin M.D.   On: 12/21/2015 16:06    Assessment & Plan:   Problem List Items Addressed This Visit    Insect bite of arm, left, infected, sequela    Left arm appears normal today despite history of cellulitis vs allergic reaction to insect bite.       Numbness of left hand - Primary    Intermittent with triggers including positioning of neck (flexion to the right ) raising  possibilty of cervical spine etiology/origin.  Also has overuse of forearms in daily work so CTS also possible .  Plain films of spine ordered.        Relevant Orders   DG Cervical Spine Complete (Completed)      A total of 25 minutes of face to face time was spent with patient more than half of which was spent in counselling about the above mentioned conditions  and coordination of care  I am having Ms. Worst  maintain her Vitamin D3, naproxen sodium, KRILL OIL PO, Cyanocobalamin, Cranberry, aspirin, multivitamin with minerals, vitamin C, Probiotic Product (PROBIOTIC DAILY PO), and diphenhydrAMINE.  No orders of the defined types were placed in this encounter.   There are no discontinued medications.  Follow-up: No Follow-up on file.   Crecencio Mc, MD

## 2017-05-06 NOTE — Patient Instructions (Signed)
YOU ARE DESCRIBING A RADICULOPATHY involving C6 C7 and C8   Plain x rays today to determine if the source is an alignment issue in your cervical spine

## 2017-05-07 ENCOUNTER — Encounter: Payer: Self-pay | Admitting: Internal Medicine

## 2017-05-07 DIAGNOSIS — R200 Anesthesia of skin: Secondary | ICD-10-CM | POA: Insufficient documentation

## 2017-05-07 DIAGNOSIS — W57XXXS Bitten or stung by nonvenomous insect and other nonvenomous arthropods, sequela: Secondary | ICD-10-CM

## 2017-05-07 DIAGNOSIS — S40862S Insect bite (nonvenomous) of left upper arm, sequela: Secondary | ICD-10-CM

## 2017-05-07 DIAGNOSIS — L089 Local infection of the skin and subcutaneous tissue, unspecified: Secondary | ICD-10-CM | POA: Insufficient documentation

## 2017-05-07 NOTE — Assessment & Plan Note (Signed)
Left arm appears normal today despite history of cellulitis vs allergic reaction to insect bite.

## 2017-05-07 NOTE — Assessment & Plan Note (Signed)
Intermittent with triggers including positioning of neck (flexion to the right ) raising  possibilty of cervical spine etiology/origin.  Also has overuse of forearms in daily work so CTS also possible .  Plain films of spine ordered.

## 2017-05-09 ENCOUNTER — Other Ambulatory Visit (INDEPENDENT_AMBULATORY_CARE_PROVIDER_SITE_OTHER): Payer: BLUE CROSS/BLUE SHIELD

## 2017-05-09 DIAGNOSIS — R7301 Impaired fasting glucose: Secondary | ICD-10-CM | POA: Diagnosis not present

## 2017-05-09 DIAGNOSIS — R5383 Other fatigue: Secondary | ICD-10-CM

## 2017-05-09 DIAGNOSIS — E785 Hyperlipidemia, unspecified: Secondary | ICD-10-CM

## 2017-05-09 LAB — COMPREHENSIVE METABOLIC PANEL
ALT: 12 U/L (ref 0–35)
AST: 14 U/L (ref 0–37)
Albumin: 4.3 g/dL (ref 3.5–5.2)
Alkaline Phosphatase: 78 U/L (ref 39–117)
BUN: 20 mg/dL (ref 6–23)
CO2: 29 meq/L (ref 19–32)
Calcium: 9.6 mg/dL (ref 8.4–10.5)
Chloride: 104 mEq/L (ref 96–112)
Creatinine, Ser: 0.79 mg/dL (ref 0.40–1.20)
GFR: 78.29 mL/min (ref >60.00)
Glucose, Bld: 98 mg/dL (ref 70–99)
Potassium: 3.8 mEq/L (ref 3.5–5.1)
Sodium: 141 mEq/L (ref 135–145)
Total Bilirubin: 0.5 mg/dL (ref 0.2–1.2)
Total Protein: 6.9 g/dL (ref 6.0–8.3)

## 2017-05-09 LAB — LIPID PANEL
CHOL/HDL RATIO: 2
Cholesterol: 205 mg/dL — ABNORMAL HIGH (ref 0–200)
HDL: 96.6 mg/dL (ref >39.00)
LDL Cholesterol: 98 mg/dL (ref 0–99)
NonHDL: 108.17
TRIGLYCERIDES: 49 mg/dL (ref 0.0–149.0)
VLDL: 9.8 mg/dL (ref 0.0–40.0)

## 2017-05-09 LAB — TSH: TSH: 2.1 u[IU]/mL (ref 0.35–4.50)

## 2017-05-09 LAB — VITAMIN B12

## 2017-05-09 LAB — HEMOGLOBIN A1C: Hgb A1c MFr Bld: 6.4 % (ref 4.6–6.5)

## 2017-05-11 ENCOUNTER — Encounter: Payer: Self-pay | Admitting: Internal Medicine

## 2017-05-11 DIAGNOSIS — E119 Type 2 diabetes mellitus without complications: Secondary | ICD-10-CM | POA: Insufficient documentation

## 2017-05-13 ENCOUNTER — Ambulatory Visit (INDEPENDENT_AMBULATORY_CARE_PROVIDER_SITE_OTHER): Payer: BLUE CROSS/BLUE SHIELD | Admitting: Internal Medicine

## 2017-05-13 ENCOUNTER — Telehealth: Payer: Self-pay | Admitting: Internal Medicine

## 2017-05-13 ENCOUNTER — Encounter: Payer: Self-pay | Admitting: Internal Medicine

## 2017-05-13 DIAGNOSIS — R7303 Prediabetes: Secondary | ICD-10-CM

## 2017-05-13 DIAGNOSIS — R200 Anesthesia of skin: Secondary | ICD-10-CM

## 2017-05-13 DIAGNOSIS — M47812 Spondylosis without myelopathy or radiculopathy, cervical region: Secondary | ICD-10-CM

## 2017-05-13 DIAGNOSIS — M4692 Unspecified inflammatory spondylopathy, cervical region: Secondary | ICD-10-CM

## 2017-05-13 NOTE — Telephone Encounter (Signed)
noted 

## 2017-05-13 NOTE — Patient Instructions (Addendum)
We will recheck your A1c in 6 months.   Your goal is to lose 19 lbs by then throught diet and a low glycemic index diet  Keep snacks at 5 g surgar or less  Avoid potatoes, rice and non low carb bread on a daily basis      Low carb ice cream  Has become more popular and there are many more choices :  Breyer's Carb Smart Line of fudgsicles and ice cream bars   Halo Top  E enlightenment  Yazzo  frozen yogurt   Skinny Cow  Weight watchers    Here are several low carb protein bars that make great snacks:   Power Crunch  KIND  ( 5 g sugar  Variety)  Quest  Atkins     To make a low carb chip :  Take the Joseph's Lavash or Pita bread,  Or the [place] Low carb whole wheat tortilla   Place on metal cookie sheet  Brush with olive oil  Sprinkle garlic powder (NOT garlic salt), grated parmesan cheese, mediterranean seasoning , or all of them?  Bake at 275 for 30 minutes   We have substitutions for your potatoes!!  Try the mashed cauliflower and riced cauliflower dishes instead of rice and mashed potatoes  Mashed turnips are also very low carb!   For desserts :  Try the Dannon Lt n Fit greek yogurt dessert flavors and top with reddi Whip .  8 carbs,  80 calories  Try Oikos Triple Zero [place] Yogurt in the salted caramel, and the coffee flavors  With Whipped Cream for dessert  breyer's low carb ice cream, available in bars (on a stick, better ) or scoopable ice cream  HERE ARE THE LOW CARB  BREAD CHOICES

## 2017-05-13 NOTE — Progress Notes (Signed)
Subjective:  Patient ID: Kathy [name], female    DOB: 08/07/1956  Age: 61 y.o. 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  HPI  Kathy [name] presents for FOLLOW UP ON RECENT imaging studies done to evaluate cervical spine,  And abnormal  labs suggestive of prediabetes  1) abnormal cervical spine films.  Patient notes that her previously bothersome radiculopathy involving her wrists and hands has improved since she has paid special attention to her neck position.  She notes specifically that her symptoms better since supporting neck in neutral position with "airport pillow."  She has deferred further workup at this time.  She continues to engage in relatively heavy repetitive lifting at her part time job.  2) elevated a1c.  Her  random glucoses have never been markedly elevated, but her A1c suggests she is at risk for developing diabetes.  Current diet, BMI and exercise pattern reviewed.   Lab Results  Component Value Date   HGBA1C 6.4 05/09/2017      Outpatient Medications Prior to Visit  Medication Sig Dispense Refill  . Ascorbic Acid (VITAMIN C) 1000 MG tablet Take 1,000 mg by mouth daily.    Marland Kitchen aspirin 81 MG tablet Take 81 mg by mouth daily.    . Cholecalciferol (VITAMIN D3) 10000 UNITS capsule Take 10,000 Units by mouth daily. Reported on 12/27/2015    . Cranberry (SM CRANBERRY) 300 MG tablet Take 300 mg by mouth daily.    . Cyanocobalamin 5000 MCG SUBL Place 1 tablet under the tongue daily.    . diphenhydrAMINE (BENADRYL) 25 MG tablet Take 25 mg by mouth every 6 (six) hours as needed.    Marland Kitchen KRILL OIL PO Take 1 capsule by mouth daily.    . Multiple Vitamins-Minerals (MULTIVITAMIN WITH MINERALS) tablet Take 1 tablet by mouth 2 (two) times daily.    . naproxen sodium (ANAPROX) 220 MG tablet Take 220 mg by mouth daily.    . Probiotic Product (PROBIOTIC DAILY PO) Take by mouth daily.     No  facility-administered medications prior to visit.     Review of Systems;  Patient denies headache, fevers, malaise, unintentional weight loss, skin rash, eye pain, sinus congestion and sinus pain, sore throat, dysphagia,  hemoptysis , cough, dyspnea, wheezing, chest pain, palpitations, orthopnea, edema, abdominal pain, nausea, melena, diarrhea, constipation, flank pain, dysuria, hematuria, urinary  Frequency, nocturia, numbness, tingling, seizures,  Focal weakness, Loss of consciousness,  Tremor, insomnia, depression, anxiety, and suicidal ideation.      Objective:  BP 140/80 (BP Location: Left Arm, Patient Position: Sitting, Cuff Size: Normal)   Pulse 84   Temp 97.9 F (36.6 C) (Oral)   Resp 15   Ht 5\' 6"  (1.676 m)   Wt 191 lb 6.4 oz (86.8 kg)   SpO2 97%   BMI 30.89 kg/m   BP Readings from Last 3 Encounters:  05/13/17 140/80  05/06/17 (!) 146/82  01/03/16 104/61    Wt Readings from Last 3 Encounters:  05/13/17 191 lb 6.4 oz (86.8 kg)  05/06/17 191 lb 3.2 oz (86.7 kg)  01/03/16 182 lb (82.6 kg)    General appearance: alert, cooperative and appears stated age Ears: normal TM's and external ear canals both ears Throat: lips, mucosa, and tongue normal; teeth and gums normal Neck: no adenopathy, no carotid bruit, supple, symmetrical, trachea midline and thyroid not enlarged, symmetric, no tenderness/mass/nodules Back: symmetric, no curvature.  ROM normal. No CVA tenderness. Lungs: clear to auscultation bilaterally Heart: regular rate and rhythm, S1, S2 normal, no murmur, click, rub or gallop Abdomen: soft, non-tender; bowel sounds normal; no masses,  no organomegaly Pulses: 2+ and symmetric Skin: Skin color, texture, turgor normal. No rashes or lesions Lymph nodes: Cervical, supraclavicular, and axillary nodes normal.  Lab Results  Component Value Date   HGBA1C 6.4 05/09/2017    Lab Results  Component Value Date   CREATININE 0.79 05/09/2017   CREATININE 0.75 07/11/2015    CREATININE 0.8 11/26/2013    Lab Results  Component Value Date   WBC 5.1 07/11/2015   HGB 12.9 07/11/2015   HCT 38.5 07/11/2015   PLT 333.0 07/11/2015   GLUCOSE 98 05/09/2017   CHOL 205 (H) 05/09/2017   TRIG 49.0 05/09/2017   HDL 96.60 05/09/2017   LDLCALC 98 05/09/2017   ALT 12 05/09/2017   AST 14 05/09/2017   NA 141 05/09/2017   K 3.8 05/09/2017   CL 104 05/09/2017   CREATININE 0.79 05/09/2017   BUN 20 05/09/2017   CO2 29 05/09/2017   TSH 2.10 05/09/2017   HGBA1C 6.4 05/09/2017    Mm Screening Breast Tomo Bilateral  Result Date: 12/21/2015 CLINICAL DATA:  Screening. EXAM: 2D DIGITAL SCREENING BILATERAL MAMMOGRAM WITH CAD AND ADJUNCT TOMO COMPARISON:  Previous exam(s). ACR Breast Density Category b: There are scattered areas of fibroglandular density. FINDINGS: There are no findings suspicious for malignancy. Images were processed with CAD. IMPRESSION: No mammographic evidence of malignancy. A result letter of this screening mammogram will be mailed directly to the patient. RECOMMENDATION: Screening mammogram in one year. (Code:SM-B-01Y) BI-RADS CATEGORY  1: Negative. Electronically Signed   By: Curlene Dolphin M.D.   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  MRI deferred       Numbness of left hand    Exam and history suggesting etiology is DDD cervical spine .  MRI and EMG/[place] studies offered as next appropriate steps o confirm,  But patient has deferred, stating that since she has started using pillow for neck support her symptoms have improved       Prediabetes    Her  random glucose is not  elevated but her A1c suggests she is at risk for developing diabetes.  I recommend he follow a low glycemic index diet and particpate regularly in an aerobic  exercise  activity.  We should check an A1c in 6 months.          A total of 40 minutes of face to face time was spent with patient more than half of which was spent in counselling and coordination of care   I am having Ms. Zellars maintain her Vitamin D3, naproxen sodium, KRILL OIL PO, Cyanocobalamin, Cranberry, aspirin, multivitamin with minerals, vitamin C, Probiotic Product (PROBIOTIC DAILY PO), and diphenhydrAMINE.  No orders of the defined types were placed in this encounter.   There are no discontinued medications.  Follow-up: Return in about 6 months (around 11/10/2017).   Crecencio Mc, MD

## 2017-05-13 NOTE — Telephone Encounter (Signed)
Patient recently saw PCP on 05/06/17 and PCP ask if patient still would like to keep  Appointment if not can cancel .If there is a reason patient feels she needs to be seen PCP will Gladly see her.

## 2017-05-13 NOTE — Telephone Encounter (Signed)
Pt called back and stated that she is still coming in for her appt at 3 pm as it is a well visit.

## 2017-05-14 DIAGNOSIS — M47812 Spondylosis without myelopathy or radiculopathy, cervical region: Secondary | ICD-10-CM | POA: Insufficient documentation

## 2017-05-14 NOTE — Assessment & Plan Note (Signed)
Secondary to degenerative changes, by plain films, with radiculopathy to left hand improve with neck support in neutral position.  MRI deferred

## 2017-05-14 NOTE — Assessment & Plan Note (Signed)
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  MRI and EMG/[place] studies offered as next appropriate steps o confirm,  But patient has deferred, stating that since she has started using pillow for neck support her symptoms have improved

## 2017-05-14 NOTE — Assessment & Plan Note (Signed)
Her  random glucose is not  elevated but her A1c suggests she is at risk for developing diabetes.  I recommend he follow a low glycemic index diet and particpate regularly in an aerobic  exercise activity.  We should check an A1c in 6 months.   

## 2017-05-16 ENCOUNTER — Other Ambulatory Visit: Payer: Self-pay | Admitting: Internal Medicine

## 2017-05-16 DIAGNOSIS — Z1231 Encounter for screening mammogram for malignant neoplasm of breast: Secondary | ICD-10-CM

## 2017-06-05 ENCOUNTER — Ambulatory Visit
Admission: RE | Admit: 2017-06-05 | Discharge: 2017-06-05 | Disposition: A | Discharge status: Home / Self Care | Payer: BLUE CROSS/BLUE SHIELD | Source: Ambulatory Visit | Attending: Internal Medicine | Admitting: Internal Medicine

## 2017-06-05 DIAGNOSIS — Z1231 Encounter for screening mammogram for malignant neoplasm of breast: Secondary | ICD-10-CM

## 2017-11-11 ENCOUNTER — Encounter: Payer: Self-pay | Admitting: Internal Medicine

## 2017-11-11 ENCOUNTER — Ambulatory Visit: Payer: BLUE CROSS/BLUE SHIELD | Admitting: Internal Medicine

## 2017-11-11 VITALS — BP 126/78 | HR 72 | Temp 97.9°F | Resp 14 | Ht 66.0 in | Wt 176.4 lb

## 2017-11-11 DIAGNOSIS — R7303 Prediabetes: Secondary | ICD-10-CM

## 2017-11-11 DIAGNOSIS — E7800 Pure hypercholesterolemia, unspecified: Secondary | ICD-10-CM | POA: Diagnosis not present

## 2017-11-11 DIAGNOSIS — T148XXA Other injury of unspecified body region, initial encounter: Secondary | ICD-10-CM

## 2017-11-11 DIAGNOSIS — Z0000 Encounter for general adult medical examination without abnormal findings: Secondary | ICD-10-CM

## 2017-11-11 LAB — CBC WITH DIFFERENTIAL/PLATELET
Basophils Absolute: 0 10*3/uL (ref 0.0–0.1)
Basophils Relative: 0.5 % (ref 0.0–3.0)
Eosinophils Absolute: 0.1 10*3/uL (ref 0.0–0.7)
Eosinophils Relative: 1.2 % (ref 0.0–5.0)
HCT: 38.7 % (ref 36.0–46.0)
HEMOGLOBIN: 13.2 g/dL (ref 12.0–15.0)
LYMPHS PCT: 26.1 % (ref 12.0–46.0)
Lymphs Abs: 1.7 10*3/uL (ref 0.7–4.0)
MCHC: 34.2 g/dL (ref 30.0–36.0)
MCV: 89 fl (ref 78.0–100.0)
MONOS PCT: 4.5 % (ref 3.0–12.0)
Monocytes Absolute: 0.3 10*3/uL (ref 0.1–1.0)
Neutro Abs: 4.4 10*3/uL (ref 1.4–7.7)
Neutrophils Relative %: 67.7 % (ref 43.0–77.0)
Platelets: 311 10*3/uL (ref 150.0–400.0)
RBC: 4.35 Mil/uL (ref 3.87–5.11)
RDW: 12.6 % (ref 11.5–15.5)
WBC: 6.6 10*3/uL (ref 4.0–10.5)

## 2017-11-11 LAB — COMPREHENSIVE METABOLIC PANEL
ALBUMIN: 4.6 g/dL (ref 3.5–5.2)
ALK PHOS: 81 U/L (ref 39–117)
ALT: 13 U/L (ref 0–35)
AST: 15 U/L (ref 0–37)
BUN: 23 mg/dL (ref 6–23)
CALCIUM: 10 mg/dL (ref 8.4–10.5)
CHLORIDE: 102 meq/L (ref 96–112)
CO2: 29 mEq/L (ref 19–32)
Creatinine, Ser: 0.77 mg/dL (ref 0.40–1.20)
GFR: 80.5 mL/min (ref >60.00)
Glucose, Bld: 106 mg/dL — ABNORMAL HIGH (ref 70–99)
POTASSIUM: 3.8 meq/L (ref 3.5–5.1)
SODIUM: 139 meq/L (ref 135–145)
Total Bilirubin: 0.5 mg/dL (ref 0.2–1.2)
Total Protein: 7.6 g/dL (ref 6.0–8.3)

## 2017-11-11 LAB — LIPID PANEL
Cholesterol: 223 mg/dL — ABNORMAL HIGH (ref 0–200)
HDL: 92.9 mg/dL (ref >39.00)
LDL CALC: 122 mg/dL — AB (ref 0–99)
NonHDL: 129.82
TRIGLYCERIDES: 40 mg/dL (ref 0.0–149.0)
Total CHOL/HDL Ratio: 2
VLDL: 8 mg/dL (ref 0.0–40.0)

## 2017-11-11 LAB — POCT GLYCOSYLATED HEMOGLOBIN (HGB A1C): HEMOGLOBIN A1C: 5.7

## 2017-11-11 NOTE — Progress Notes (Signed)
Subjective:  Patient ID: Kathy [name], female    DOB: Nov 16, 1955  Age: 62 y.o. MRN: [id number]  CC: The primary encounter diagnosis was Prediabetes. Diagnoses of Encounter for preventive health examination, Pure hypercholesterolemia, and Bruising were also pertinent to this visit.  HPI Kathy [name] presents for  Follow up on prediabetes  And obesity .  She was diagnosed with prediabetes in September. She was counselled on diet and need for exercise  Has lost 15 lbs since September primarily by dietary restrictions and adherence to a daily walking program.  Feels great    Exam normal for bruises on forearms  .  Gardening mishaps,  All explainable.   history of blood donation.   Rejected last time due to low iron stores;  Was told that hot tea transiently lowers her iron levels.   Last PAP smear noted atrophy  In 2015 one more due next visit    Lab Results  Component Value Date   HGBA1C 5.7 11/11/2017     Outpatient Medications Prior to Visit  Medication Sig Dispense Refill  . Ascorbic Acid (VITAMIN C) 1000 MG tablet Take 1,000 mg by mouth daily.    Marland Kitchen aspirin 81 MG tablet Take 81 mg by mouth daily.    . Cholecalciferol (VITAMIN D3) 10000 UNITS capsule Take 10,000 Units by mouth daily. Reported on 12/27/2015    . Cranberry (SM CRANBERRY) 300 MG tablet Take 300 mg by mouth daily.    . Cyanocobalamin 5000 MCG SUBL Place 1 tablet under the tongue daily.    . diphenhydrAMINE (BENADRYL) 25 MG tablet Take 25 mg by mouth every 6 (six) hours as needed.    Marland Kitchen KRILL OIL PO Take 1 capsule by mouth daily.    . Multiple Vitamins-Minerals (MULTIVITAMIN WITH MINERALS) tablet Take 1 tablet by mouth 2 (two) times daily.    . naproxen sodium (ANAPROX) 220 MG tablet Take 220 mg by mouth daily.    . Probiotic Product (PROBIOTIC DAILY PO) Take by mouth daily.     No facility-administered medications prior to visit.     Review of Systems;  Patient denies headache, fevers, malaise, unintentional  weight loss, skin rash, eye pain, sinus congestion and sinus pain, sore throat, dysphagia,  hemoptysis , cough, dyspnea, wheezing, chest pain, palpitations, orthopnea, edema, abdominal pain, nausea, melena, diarrhea, constipation, flank pain, dysuria, hematuria, urinary  Frequency, nocturia, numbness, tingling, seizures,  Focal weakness, Loss of consciousness,  Tremor, insomnia, depression, anxiety, and suicidal ideation.      Objective:  BP 126/78 (BP Location: Left Arm, Patient Position: Sitting, Cuff Size: Normal)   Pulse 72   Temp 97.9 F (36.6 C) (Oral)   Resp 14   Ht 5\' 6"  (1.676 m)   Wt 176 lb 6.4 oz (80 kg)   SpO2 97%   BMI 28.47 kg/m   BP Readings from Last 3 Encounters:  11/11/17 126/78  05/13/17 140/80  05/06/17 (!) 146/82    Wt Readings from Last 3 Encounters:  11/11/17 176 lb 6.4 oz (80 kg)  05/13/17 191 lb 6.4 oz (86.8 kg)  05/06/17 191 lb 3.2 oz (86.7 kg)    General appearance: alert, cooperative and appears stated age Ears: normal TM's and external ear canals both ears Throat: lips, mucosa, and tongue normal; teeth and gums normal Neck: no adenopathy, no carotid bruit, supple, symmetrical, trachea midline and thyroid not enlarged, symmetric, no tenderness/mass/nodules Back: symmetric, no curvature. ROM normal. No CVA tenderness. Lungs: clear to auscultation bilaterally Heart:  regular rate and rhythm, S1, S2 normal, no murmur, click, rub or gallop Abdomen: soft, non-tender; bowel sounds normal; no masses,  no organomegaly Pulses: 2+ and symmetric Skin: Skin color, texture, turgor normal. No rashes or lesions Lymph nodes: Cervical, supraclavicular, and axillary nodes normal.  Lab Results  Component Value Date   HGBA1C 5.7 11/11/2017   HGBA1C 6.4 05/09/2017    Lab Results  Component Value Date   CREATININE 0.77 11/11/2017   CREATININE 0.79 05/09/2017   CREATININE 0.75 07/11/2015    Lab Results  Component Value Date   WBC 6.6 11/11/2017   HGB 13.2  11/11/2017   HCT 38.7 11/11/2017   PLT 311.0 11/11/2017   GLUCOSE 106 (H) 11/11/2017   CHOL 223 (H) 11/11/2017   TRIG 40.0 11/11/2017   HDL 92.90 11/11/2017   LDLCALC 122 (H) 11/11/2017   ALT 13 11/11/2017   AST 15 11/11/2017   NA 139 11/11/2017   K 3.8 11/11/2017   CL 102 11/11/2017   CREATININE 0.77 11/11/2017   BUN 23 11/11/2017   CO2 29 11/11/2017   TSH 2.10 05/09/2017   HGBA1C 5.7 11/11/2017    Mm Screening Breast Tomo Bilateral  Result Date: 06/05/2017 CLINICAL DATA:  Screening. EXAM: 2D DIGITAL SCREENING BILATERAL MAMMOGRAM WITH CAD AND ADJUNCT TOMO COMPARISON:  Previous exam(s). ACR Breast Density Category b: There are scattered areas of fibroglandular density. FINDINGS: There are no findings suspicious for malignancy. Images were processed with CAD. IMPRESSION: No mammographic evidence of malignancy. A result letter of this screening mammogram will be mailed directly to the patient. RECOMMENDATION: Screening mammogram in one year. (Code:[id number]-B-01Y) BI-RADS CATEGORY  1: Negative. Electronically Signed   By: Lillia Mountain M.D.   On: 06/05/2017 09:39    Assessment & Plan:   Problem List Items Addressed This Visit    Encounter for preventive health examination   Prediabetes - Primary    She has normalized her a1c with weight loss, exercise and diet. I have congratulated her in reduction of   BMI and encouraged  Continued weight loss with goal of 10% of body weigh over the next 6 months using a low glycemic index diet and regular exercise a minimum of 5 days per week.    Lab Results  Component Value Date   HGBA1C 5.7 11/11/2017         Relevant Orders   Comprehensive metabolic panel (Completed)   POCT HgB A1C (Completed)    Other Visit Diagnoses    Pure hypercholesterolemia       Relevant Orders   Lipid panel (Completed)   Bruising       Relevant Orders   CBC with Differential/Platelet (Completed)      I am having Juvia S. Zwack maintain her Vitamin D3,  naproxen sodium, KRILL OIL PO, Cyanocobalamin, Cranberry, aspirin, multivitamin with minerals, vitamin C, Probiotic Product (PROBIOTIC DAILY PO), and diphenhydrAMINE.  No orders of the defined types were placed in this encounter.   There are no discontinued medications.  Follow-up: Return in about 6 months (around 05/14/2018) for CPE.   Crecencio Mc, MD

## 2017-11-11 NOTE — Patient Instructions (Signed)
KUDOS!    Your lifestyle changes have resolved your prediabetes ,  Hypertension and obesity!!  KEEP DOING WHAT YOU ARE DOING   See you in 6 months for a CPE with your last pap smear ever

## 2017-11-12 NOTE — Assessment & Plan Note (Addendum)
She has normalized her a1c with weight loss, exercise and diet. I have congratulated her in reduction of   BMI and encouraged  Continued weight loss with goal of 10% of body weigh over the next 6 months using a low glycemic index diet and regular exercise a minimum of 5 days per week.    Lab Results  Component Value Date   HGBA1C 5.7 11/11/2017

## 2018-05-14 ENCOUNTER — Ambulatory Visit: Payer: BLUE CROSS/BLUE SHIELD | Admitting: Internal Medicine

## 2018-05-14 ENCOUNTER — Encounter: Payer: Self-pay | Admitting: Internal Medicine

## 2018-05-14 ENCOUNTER — Other Ambulatory Visit (HOSPITAL_COMMUNITY)
Admission: RE | Admit: 2018-05-14 | Discharge: 2018-05-14 | Disposition: A | Discharge status: Home / Self Care | Payer: BLUE CROSS/BLUE SHIELD | Source: Ambulatory Visit | Attending: Internal Medicine | Admitting: Internal Medicine

## 2018-05-14 VITALS — BP 136/76 | HR 77 | Temp 97.9°F | Ht 66.0 in | Wt 180.0 lb

## 2018-05-14 DIAGNOSIS — Z1231 Encounter for screening mammogram for malignant neoplasm of breast: Secondary | ICD-10-CM | POA: Diagnosis not present

## 2018-05-14 DIAGNOSIS — Z124 Encounter for screening for malignant neoplasm of cervix: Secondary | ICD-10-CM | POA: Insufficient documentation

## 2018-05-14 DIAGNOSIS — Z1239 Encounter for other screening for malignant neoplasm of breast: Secondary | ICD-10-CM

## 2018-05-14 DIAGNOSIS — R5383 Other fatigue: Secondary | ICD-10-CM

## 2018-05-14 DIAGNOSIS — R7303 Prediabetes: Secondary | ICD-10-CM

## 2018-05-14 DIAGNOSIS — E559 Vitamin D deficiency, unspecified: Secondary | ICD-10-CM | POA: Insufficient documentation

## 2018-05-14 DIAGNOSIS — Z0000 Encounter for general adult medical examination without abnormal findings: Secondary | ICD-10-CM

## 2018-05-14 DIAGNOSIS — E119 Type 2 diabetes mellitus without complications: Secondary | ICD-10-CM | POA: Insufficient documentation

## 2018-05-14 LAB — CBC WITH DIFFERENTIAL/PLATELET
Basophils Absolute: 0 10*3/uL (ref 0.0–0.1)
Basophils Relative: 0.5 % (ref 0.0–3.0)
Eosinophils Absolute: 0.1 10*3/uL (ref 0.0–0.7)
Eosinophils Relative: 1.4 % (ref 0.0–5.0)
HEMATOCRIT: 39.8 % (ref 36.0–46.0)
Hemoglobin: 13.6 g/dL (ref 12.0–15.0)
LYMPHS ABS: 1.3 10*3/uL (ref 0.7–4.0)
Lymphocytes Relative: 19.5 % (ref 12.0–46.0)
MCHC: 34.2 g/dL (ref 30.0–36.0)
MCV: 88.3 fl (ref 78.0–100.0)
MONO ABS: 0.4 10*3/uL (ref 0.1–1.0)
Monocytes Relative: 5.9 % (ref 3.0–12.0)
NEUTROS ABS: 4.8 10*3/uL (ref 1.4–7.7)
NEUTROS PCT: 72.7 % (ref 43.0–77.0)
PLATELETS: 354 10*3/uL (ref 150.0–400.0)
RBC: 4.51 Mil/uL (ref 3.87–5.11)
RDW: 13.6 % (ref 11.5–15.5)
WBC: 6.7 10*3/uL (ref 4.0–10.5)

## 2018-05-14 LAB — TSH: TSH: 1.8 u[IU]/mL (ref 0.35–4.50)

## 2018-05-14 LAB — LIPID PANEL
CHOLESTEROL: 215 mg/dL — AB (ref 0–200)
HDL: 83.4 mg/dL (ref >39.00)
LDL Cholesterol: 123 mg/dL — ABNORMAL HIGH (ref 0–99)
NONHDL: 132.03
Total CHOL/HDL Ratio: 3
Triglycerides: 47 mg/dL (ref 0.0–149.0)
VLDL: 9.4 mg/dL (ref 0.0–40.0)

## 2018-05-14 LAB — COMPREHENSIVE METABOLIC PANEL
ALBUMIN: 4.6 g/dL (ref 3.5–5.2)
ALK PHOS: 94 U/L (ref 39–117)
ALT: 15 U/L (ref 0–35)
AST: 17 U/L (ref 0–37)
BUN: 13 mg/dL (ref 6–23)
CO2: 33 mEq/L — ABNORMAL HIGH (ref 19–32)
CREATININE: 0.81 mg/dL (ref 0.40–1.20)
Calcium: 10.1 mg/dL (ref 8.4–10.5)
Chloride: 100 mEq/L (ref 96–112)
GFR: 75.81 mL/min (ref >60.00)
GLUCOSE: 112 mg/dL — AB (ref 70–99)
POTASSIUM: 3.9 meq/L (ref 3.5–5.1)
SODIUM: 138 meq/L (ref 135–145)
TOTAL PROTEIN: 7.8 g/dL (ref 6.0–8.3)
Total Bilirubin: 0.6 mg/dL (ref 0.2–1.2)

## 2018-05-14 LAB — HEMOGLOBIN A1C: Hgb A1c MFr Bld: 6.5 % (ref 4.6–6.5)

## 2018-05-14 LAB — VITAMIN D 25 HYDROXY (VIT D DEFICIENCY, FRACTURES): VITD: 42.11 ng/mL (ref 30.00–100.00)

## 2018-05-14 MED ORDER — ZOSTER VAC RECOMB ADJUVANTED 50 MCG/0.5ML IM SUSR: 0.5000 mL | Freq: Once | INTRAMUSCULAR | 1 refills | Status: AC

## 2018-05-14 NOTE — Assessment & Plan Note (Signed)
She has gained weight and her a1c has increased to 6.5 .  I have  encouraged  Continued weight loss with goal of 10% of body weigh over the next 6 months using a low glycemic index diet and regular exercise a minimum of 5 days per week.    Lab Results  Component Value Date   HGBA1C 6.5 05/14/2018   No results found for: Derl Barrow

## 2018-05-14 NOTE — Patient Instructions (Addendum)
Sola bread    3 net carb/sclice 60 cal  [employer]ius white bread! Great for pbjs and tomato sandwhiches  Your mammogram has been ordered.  You can call to make your appointment at [facility]   The ShingRx vaccine is now available in local pharmacies and is much more protective thant Zostavaxs,  It is therefore ADVISED for all interested adults over 50 to prevent shingles    Health Maintenance for Postmenopausal Women Menopause is a normal process in which your reproductive ability comes to an end. This process happens gradually over a span of months to years, usually between the ages of 5 and 46. Menopause is complete when you have missed 12 consecutive menstrual periods. It is important to talk with your health care provider about some of the most common conditions that affect postmenopausal women, such as heart disease, cancer, and bone loss (osteoporosis). Adopting a healthy lifestyle and getting preventive care can help to promote your health and wellness. Those actions can also lower your chances of developing some of these common conditions. What should I know about menopause? During menopause, you may experience a number of symptoms, such as:  Moderate-to-severe hot flashes.  Night sweats.  Decrease in sex drive.  Mood swings.  Headaches.  Tiredness.  Irritability.  Memory problems.  Insomnia.  Choosing to treat or not to treat menopausal changes is an individual decision that you make with your health care provider. What should I know about hormone replacement therapy and supplements? Hormone therapy products are effective for treating symptoms that are associated with menopause, such as hot flashes and night sweats. Hormone replacement carries certain risks, especially as you become older. If you are thinking about using estrogen or estrogen with progestin treatments, discuss the benefits and risks with your health care provider. What should I know about heart  disease and stroke? Heart disease, heart attack, and stroke become more likely as you age. This may be due, in part, to the hormonal changes that your body experiences during menopause. These can affect how your body processes dietary fats, triglycerides, and cholesterol. Heart attack and stroke are both medical emergencies. There are many things that you can do to help prevent heart disease and stroke:  Have your blood pressure checked at least every 1-2 years. High blood pressure causes heart disease and increases the risk of stroke.  If you are 43-68 years old, ask your health care provider if you should take aspirin to prevent a heart attack or a stroke.  Do not use any tobacco products, including cigarettes, chewing tobacco, or electronic cigarettes. If you need help quitting, ask your health care provider.  It is important to eat a healthy diet and maintain a healthy weight. ? Be sure to include plenty of vegetables, fruits, low-fat dairy products, and lean protein. ? Avoid eating foods that are high in solid fats, added sugars, or salt (sodium).  Get regular exercise. This is one of the most important things that you can do for your health. ? Try to exercise for at least 150 minutes each week. The type of exercise that you do should increase your heart rate and make you sweat. This is known as moderate-intensity exercise. ? Try to do strengthening exercises at least twice each week. Do these in addition to the moderate-intensity exercise.  Know your numbers.Ask your health care provider to check your cholesterol and your blood glucose. Continue to have your blood tested as directed by your health care provider.  What should I know about cancer screening? There are several types of cancer. Take the following steps to reduce your risk and to catch any cancer development as early as possible. Breast Cancer  Practice breast self-awareness. ? This means understanding how your breasts  normally appear and feel. ? It also means doing regular breast self-exams. Let your health care provider know about any changes, no matter how small.  If you are 27 or older, have a clinician do a breast exam (clinical breast exam or CBE) every year. Depending on your age, family history, and medical history, it may be recommended that you also have a yearly breast X-ray (mammogram).  If you have a family history of breast cancer, talk with your health care provider about genetic screening.  If you are at high risk for breast cancer, talk with your health care provider about having an MRI and a mammogram every year.  Breast cancer (BRCA) gene test is recommended for women who have family members with BRCA-related cancers. Results of the assessment will determine the need for genetic counseling and BRCA1 and for BRCA2 testing. BRCA-related cancers include these types: ? Breast. This occurs in males or females. ? Ovarian. ? Tubal. This may also be called fallopian tube cancer. ? Cancer of the abdominal or pelvic lining (peritoneal cancer). ? Prostate. ? Pancreatic.  Cervical, Uterine, and Ovarian Cancer Your health care provider may recommend that you be screened regularly for cancer of the pelvic organs. These include your ovaries, uterus, and vagina. This screening involves a pelvic exam, which includes checking for microscopic changes to the surface of your cervix (Pap test).  For women ages 21-65, health care providers may recommend a pelvic exam and a Pap test every three years. For women ages 9-65, they may recommend the Pap test and pelvic exam, combined with testing for human papilloma virus (HPV), every five years. Some types of HPV increase your risk of cervical cancer. Testing for HPV may also be done on women of any age who have unclear Pap test results.  Other health care providers may not recommend any screening for nonpregnant women who are considered low risk for pelvic cancer  and have no symptoms. Ask your health care provider if a screening pelvic exam is right for you.  If you have had past treatment for cervical cancer or a condition that could lead to cancer, you need Pap tests and screening for cancer for at least 20 years after your treatment. If Pap tests have been discontinued for you, your risk factors (such as having a new sexual partner) need to be reassessed to determine if you should start having screenings again. Some women have medical problems that increase the chance of getting cervical cancer. In these cases, your health care provider may recommend that you have screening and Pap tests more often.  If you have a family history of uterine cancer or ovarian cancer, talk with your health care provider about genetic screening.  If you have vaginal bleeding after reaching menopause, tell your health care provider.  There are currently no reliable tests available to screen for ovarian cancer.  Lung Cancer Lung cancer screening is recommended for adults 49-23 years old who are at high risk for lung cancer because of a history of smoking. A yearly low-dose CT scan of the lungs is recommended if you:  Currently smoke.  Have a history of at least 30 pack-years of smoking and you currently smoke or have quit within the past  15 years. A pack-year is smoking an average of one pack of cigarettes per day for one year.  Yearly screening should:  Continue until it has been 15 years since you quit.  Stop if you develop a health problem that would prevent you from having lung cancer treatment.  Colorectal Cancer  This type of cancer can be detected and can often be prevented.  Routine colorectal cancer screening usually begins at age 51 and continues through age 48.  If you have risk factors for colon cancer, your health care provider may recommend that you be screened at an earlier age.  If you have a family history of colorectal cancer, talk with your  health care provider about genetic screening.  Your health care provider may also recommend using home test kits to check for hidden blood in your stool.  A small camera at the end of a tube can be used to examine your colon directly (sigmoidoscopy or colonoscopy). This is done to check for the earliest forms of colorectal cancer.  Direct examination of the colon should be repeated every 5-10 years until age 9. However, if early forms of precancerous polyps or small growths are found or if you have a family history or genetic risk for colorectal cancer, you may need to be screened more often.  Skin Cancer  Check your skin from head to toe regularly.  Monitor any moles. Be sure to tell your health care provider: ? About any new moles or changes in moles, especially if there is a change in a mole's shape or color. ? If you have a mole that is larger than the size of a pencil eraser.  If any of your family members has a history of skin cancer, especially at a young age, talk with your health care provider about genetic screening.  Always use sunscreen. Apply sunscreen liberally and repeatedly throughout the day.  Whenever you are outside, protect yourself by wearing long sleeves, pants, a wide-brimmed hat, and sunglasses.  What should I know about osteoporosis? Osteoporosis is a condition in which bone destruction happens more quickly than new bone creation. After menopause, you may be at an increased risk for osteoporosis. To help prevent osteoporosis or the bone fractures that can happen because of osteoporosis, the following is recommended:  If you are 5-5 years old, get at least 1,000 mg of calcium and at least 600 mg of vitamin D per day.  If you are older than age 27 but younger than age 42, get at least 1,200 mg of calcium and at least 600 mg of vitamin D per day.  If you are older than age 55, get at least 1,200 mg of calcium and at least 800 mg of vitamin D per day.  Smoking  and excessive alcohol intake increase the risk of osteoporosis. Eat foods that are rich in calcium and vitamin D, and do weight-bearing exercises several times each week as directed by your health care provider. What should I know about how menopause affects my mental health? Depression may occur at any age, but it is more common as you become older. Common symptoms of depression include:  Low or sad mood.  Changes in sleep patterns.  Changes in appetite or eating patterns.  Feeling an overall lack of motivation or enjoyment of activities that you previously enjoyed.  Frequent crying spells.  Talk with your health care provider if you think that you are experiencing depression. What should I know about immunizations? It is important  that you get and maintain your immunizations. These include:  Tetanus, diphtheria, and pertussis (Tdap) booster vaccine.  Influenza every year before the flu season begins.  Pneumonia vaccine.  Shingles vaccine.  Your health care provider may also recommend other immunizations. This information is not intended to replace advice given to you by your health care provider. Make sure you discuss any questions you have with your health care provider. Document Released: 10/05/2005 Document Revised: 03/02/2016 Document Reviewed: 05/17/2015 Elsevier Interactive Patient Education  2018 [employer].

## 2018-05-14 NOTE — Assessment & Plan Note (Signed)
Annual comprehensive preventive exam was done as well as an evaluation and management of chronic conditions .  During the course of the visit the patient was educated and counseled about appropriate screening and preventive services including :  diabetes screening, lipid analysis with projected  10 year  risk for CAD , nutrition counseling, breast, cervical and colorectal cancer screening, and recommended immunizations.  Printed recommendations for health maintenance screenings was given 

## 2018-05-14 NOTE — Progress Notes (Signed)
Pre visit review using our clinic review tool, if applicable. No additional management support is needed unless otherwise documented below in the visit note. 

## 2018-05-14 NOTE — Progress Notes (Signed)
Patient ID: CORDA SHUTT, female    DOB: 1956-05-26  Age: 62 y.o. MRN: [id number]  The patient is here for annual  Preventive examination and management of other chronic and acute problems.    Due for PAP smear Fasting  Mammogram needed   The risk factors are reflected in the social history.  The roster of all physicians providing medical care to patient - is listed in the Snapshot section of the chart.  Activities of daily living:  The patient is 100% independent in all ADLs: dressing, toileting, feeding as well as independent mobility  Home safety : The patient has smoke detectors in the home. They wear seatbelts.  There are no firearms at home. There is no violence in the home.   There is no risks for hepatitis, STDs or HIV. There is no   history of blood transfusion. They have no travel history to infectious disease endemic areas of the world.  The patient has seen their dentist in the last six month. They have seen their eye doctor in the last year. They admit to slight hearing difficulty with regard to whispered voices and some television programs.  They have deferred audiologic testing in the last year.  They do not  have excessive sun exposure. Discussed the need for sun protection: hats, long sleeves and use of sunscreen if there is significant sun exposure.   Diet: the importance of a healthy diet is discussed. They do have a healthy diet.  The benefits of regular aerobic exercise were discussed. She exercises 3 times per week ,  30 minutes.   Depression screen: there are no signs or vegative symptoms of depression- irritability, change in appetite, anhedonia, sadness/tearfullness.  Cognitive assessment: the patient manages all their financial and personal affairs and is actively engaged. They could relate day,date,year and events; recalled 2/3 objects at 3 minutes; performed clock-face test normally.  The following portions of the patient's history were reviewed and updated as  appropriate: allergies, current medications, past family history, past medical history,  past surgical history, past social history  and problem list.  Visual acuity was not assessed per patient preference since she has regular follow up with her ophthalmologist. Hearing and body mass index were assessed and reviewed.   During the course of the visit the patient was educated and counseled about appropriate screening and preventive services including : fall prevention , diabetes screening, nutrition counseling, colorectal cancer screening, and recommended immunizations.    CC: The primary encounter diagnosis was Cervical cancer screening. Diagnoses of Breast cancer screening, Prediabetes, Fatigue, unspecified type, Vitamin D deficiency, Encounter for preventive health examination, and Diabetes mellitus without complication ([place]) were also pertinent to this visit.  History Aljean has a past medical history of Arthritis, GERD (gastroesophageal reflux disease), and Wears contact lenses.   She has a past surgical history that includes fatty tissue under bilateral arm; Colonoscopy (2006); and Colonoscopy (N/A, 01/03/2016).   Her family history includes Breast cancer (age of onset: 26) in her sister; Cancer (age of onset: 75) in her father; Diabetes in her mother; Hyperlipidemia in her mother; Hypertension in her mother.She reports that she has never smoked. She has never used smokeless tobacco. She reports that she drinks about 10.0 standard drinks of alcohol per week. She reports that she does not use drugs.  Outpatient Medications Prior to Visit  Medication Sig Dispense Refill  . Ascorbic Acid (VITAMIN C) 1000 MG tablet Take 1,000 mg by mouth daily.    Marland Kitchen aspirin  81 MG tablet Take 81 mg by mouth daily.    . Cranberry (SM CRANBERRY) 300 MG tablet Take 300 mg by mouth daily.    . Cyanocobalamin 5000 MCG SUBL Place 1 tablet under the tongue daily.    . diphenhydrAMINE (BENADRYL) 25 MG tablet Take 25 mg by  mouth every 6 (six) hours as needed.    Marland Kitchen KRILL OIL PO Take 1 capsule by mouth daily.    Marland Kitchen MAGNESIUM GLUCONATE PO Take 400 mg by mouth.    . Multiple Vitamins-Minerals (MULTIVITAMIN WITH MINERALS) tablet Take 1 tablet by mouth 2 (two) times daily.    . naproxen sodium (ANAPROX) 220 MG tablet Take 220 mg by mouth daily.    . Probiotic Product (PROBIOTIC DAILY PO) Take by mouth daily.    . Cholecalciferol (VITAMIN D3) 10000 UNITS capsule Take 10,000 Units by mouth daily. Reported on 12/27/2015     No facility-administered medications prior to visit.     Review of Systems   Patient denies headache, fevers, malaise, unintentional weight loss, skin rash, eye pain, sinus congestion and sinus pain, sore throat, dysphagia,  hemoptysis , cough, dyspnea, wheezing, chest pain, palpitations, orthopnea, edema, abdominal pain, nausea, melena, diarrhea, constipation, flank pain, dysuria, hematuria, urinary  Frequency, nocturia, numbness, tingling, seizures,  Focal weakness, Loss of consciousness,  Tremor, insomnia, depression, anxiety, and suicidal ideation.     Objective:  BP 136/76   Pulse 77   Temp 97.9 F (36.6 C) (Oral)   Ht 5\' 6"  (1.676 m)   Wt 180 lb (81.6 kg)   SpO2 96%   BMI 29.05 kg/m   Physical Exam   General Appearance:    Alert, cooperative, no distress, appears stated age  Head:    Normocephalic, without obvious abnormality, atraumatic  Eyes:    PERRL, conjunctiva/corneas clear, EOM's intact, fundi    benign, both eyes  Ears:    Normal TM's and external ear canals, both ears  Nose:   Nares normal, septum midline, mucosa normal, no drainage    or sinus tenderness  Throat:   Lips, mucosa, and tongue normal; teeth and gums normal  Neck:   Supple, symmetrical, trachea midline, no adenopathy;    thyroid:  no enlargement/tenderness/nodules; no carotid   bruit or JVD  Back:     Symmetric, no curvature, ROM normal, no CVA tenderness  Lungs:     Clear to auscultation bilaterally,  respirations unlabored  Chest Wall:    No tenderness or deformity   Heart:    Regular rate and rhythm, S1 and S2 normal, no murmur, rub   or gallop  Breast Exam:    No tenderness, masses, or nipple abnormality  Abdomen:     Soft, non-tender, bowel sounds active all four quadrants,    no masses, no organomegaly  Genitalia:    Pelvic: cervix normal in appearance, external genitalia normal, no adnexal masses or tenderness, no cervical motion tenderness, rectovaginal septum normal, uterus normal size, shape, and consistency and vagina normal without discharge  Extremities:   Extremities normal, atraumatic, no cyanosis or edema  Pulses:   2+ and symmetric all extremities  Skin:   Skin color, texture, turgor normal, no rashes or lesions  Lymph nodes:   Cervical, supraclavicular, and axillary nodes normal  Neurologic:   CNII-XII intact, normal strength, sensation and reflexes    throughout      Assessment & Plan:   Problem List Items Addressed This Visit    Diabetes mellitus without complication ([place])  She has gained weight and her a1c has increased to 6.5 .  I have  encouraged  Continued weight loss with goal of 10% of body weigh over the next 6 months using a low glycemic index diet and regular exercise a minimum of 5 days per week.    Lab Results  Component Value Date   HGBA1C 6.5 05/14/2018   No results found for: Derl Barrow       Encounter for preventive health examination    Annual comprehensive preventive exam was done as well as an evaluation and management of chronic conditions .  During the course of the visit the patient was educated and counseled about appropriate screening and preventive services including :  diabetes screening, lipid analysis with projected  10 year  risk for CAD , nutrition counseling, breast, cervical and colorectal cancer screening, and recommended immunizations.  Printed recommendations for health maintenance screenings was given        Other Visit Diagnoses    Cervical cancer screening    -  Primary   Relevant Orders   Cytology - PAP   Breast cancer screening       Relevant Orders   MM DIGITAL SCREENING BILATERAL   Fatigue, unspecified type       Relevant Orders   TSH (Completed)   CBC with Differential/Platelet (Completed)   Vitamin D deficiency       Relevant Orders   VITAMIN D 25 Hydroxy (Vit-D Deficiency, Fractures) (Completed)      I am having Yena S. Branscom start on Zoster Vaccine Adjuvanted. I am also having her maintain her Vitamin D3, naproxen sodium, KRILL OIL PO, Cyanocobalamin, Cranberry, aspirin, multivitamin with minerals, vitamin C, Probiotic Product (PROBIOTIC DAILY PO), diphenhydrAMINE, and MAGNESIUM GLUCONATE PO.  Meds ordered this encounter  Medications  . Zoster Vaccine Adjuvanted [facility]) injection    Sig: Inject 0.5 mLs into the muscle once for 1 dose.    Dispense:  1 each    Refill:  1    There are no discontinued medications.  Follow-up: No follow-ups on file.   Crecencio Mc, MD

## 2018-05-15 LAB — CYTOLOGY - PAP
DIAGNOSIS: NEGATIVE
HPV: NOT DETECTED

## 2018-06-09 ENCOUNTER — Other Ambulatory Visit: Payer: Self-pay | Admitting: Internal Medicine

## 2018-06-09 ENCOUNTER — Ambulatory Visit
Admission: RE | Admit: 2018-06-09 | Discharge: 2018-06-09 | Disposition: A | Discharge status: Home / Self Care | Payer: BLUE CROSS/BLUE SHIELD | Source: Ambulatory Visit | Attending: Internal Medicine | Admitting: Internal Medicine

## 2018-06-09 DIAGNOSIS — Z1239 Encounter for other screening for malignant neoplasm of breast: Secondary | ICD-10-CM

## 2018-11-20 ENCOUNTER — Other Ambulatory Visit: Payer: Self-pay

## 2018-11-20 ENCOUNTER — Encounter: Payer: Self-pay | Admitting: Internal Medicine

## 2018-11-20 ENCOUNTER — Ambulatory Visit (INDEPENDENT_AMBULATORY_CARE_PROVIDER_SITE_OTHER): Payer: BLUE CROSS/BLUE SHIELD | Admitting: Internal Medicine

## 2018-11-20 VITALS — BP 146/80 | HR 75 | Temp 98.0°F | Resp 14 | Ht 66.0 in | Wt 183.2 lb

## 2018-11-20 DIAGNOSIS — I10 Essential (primary) hypertension: Secondary | ICD-10-CM | POA: Diagnosis not present

## 2018-11-20 DIAGNOSIS — R200 Anesthesia of skin: Secondary | ICD-10-CM

## 2018-11-20 DIAGNOSIS — E1169 Type 2 diabetes mellitus with other specified complication: Secondary | ICD-10-CM

## 2018-11-20 DIAGNOSIS — E119 Type 2 diabetes mellitus without complications: Secondary | ICD-10-CM

## 2018-11-20 DIAGNOSIS — E785 Hyperlipidemia, unspecified: Secondary | ICD-10-CM

## 2018-11-20 LAB — COMPREHENSIVE METABOLIC PANEL
ALT: 13 U/L (ref 0–35)
AST: 14 U/L (ref 0–37)
Albumin: 4.4 g/dL (ref 3.5–5.2)
Alkaline Phosphatase: 86 U/L (ref 39–117)
BUN: 18 mg/dL (ref 6–23)
CALCIUM: 9.9 mg/dL (ref 8.4–10.5)
CO2: 30 mEq/L (ref 19–32)
Chloride: 103 mEq/L (ref 96–112)
Creatinine, Ser: 0.8 mg/dL (ref 0.40–1.20)
GFR: 72.24 mL/min (ref >60.00)
Glucose, Bld: 110 mg/dL — ABNORMAL HIGH (ref 70–99)
POTASSIUM: 4.5 meq/L (ref 3.5–5.1)
Sodium: 139 mEq/L (ref 135–145)
Total Bilirubin: 0.4 mg/dL (ref 0.2–1.2)
Total Protein: 7 g/dL (ref 6.0–8.3)

## 2018-11-20 LAB — LIPID PANEL
Cholesterol: 221 mg/dL — ABNORMAL HIGH (ref 0–200)
HDL: 91.5 mg/dL (ref >39.00)
LDL Cholesterol: 121 mg/dL — ABNORMAL HIGH (ref 0–99)
NonHDL: 129.5
TRIGLYCERIDES: 42 mg/dL (ref 0.0–149.0)
Total CHOL/HDL Ratio: 2
VLDL: 8.4 mg/dL (ref 0.0–40.0)

## 2018-11-20 LAB — MICROALBUMIN / CREATININE URINE RATIO
Creatinine,U: 31.1 mg/dL
Microalb Creat Ratio: 2.3 mg/g (ref 0.0–30.0)
Microalb, Ur: <0.7 mg/dL (ref 0.0–1.9)

## 2018-11-20 LAB — HEMOGLOBIN A1C: Hgb A1c MFr Bld: 6.5 % (ref 4.6–6.5)

## 2018-11-20 MED ORDER — TELMISARTAN 20 MG PO TABS: 20.0000 mg | ORAL_TABLET | Freq: Every day | ORAL | 1 refills | Status: DC

## 2018-11-20 NOTE — Progress Notes (Signed)
Subjective:  Patient ID: Francesca Oman, female    DOB: 14-Jun-1956  Age: 63 y.o. MRN: [id number]  CC: The primary encounter diagnosis was Diabetes mellitus without complication ([place]). Diagnoses of Numbness of left hand, Essential hypertension, and Hyperlipidemia associated with type 2 diabetes mellitus ([place]) were also pertinent to this visit.  HPI   Neck pain worsening despite seeing a chiropractor  Once a month for adjustment .  Doing stretches and exercises on a regular basis.  She notes that her right leg gives out occasionally with walking,  And occasionally the left leg the following day.  Her symptoms do improve with regular performance of the stretching exercises.   follow up on diabetes.  Patient  is following a low glycemic index diet and taking all prescribed medications regularly without side effects.  Fasting sugars have been under less than 140 most of the time and post prandials have been under 160 except on rare occasions. Patient is exercising about 5 times per week and intentionally trying to lose weight .  Patient has had an eye exam in the last 12 months and checks feet regularly for signs of infection.  Patient does not walk barefoot outside,  And denies an numbness tingling or burning in feet. Patient is up to date on all recommended vaccinations    Outpatient Medications Prior to Visit  Medication Sig Dispense Refill  . Ascorbic Acid (VITAMIN C) 1000 MG tablet Take 1,000 mg by mouth daily.    Marland Kitchen aspirin 81 MG tablet Take 81 mg by mouth daily.    . Cholecalciferol (VITAMIN D3) 10000 UNITS capsule Take 10,000 Units by mouth daily. Reported on 12/27/2015    . Cranberry (SM CRANBERRY) 300 MG tablet Take 300 mg by mouth daily.    . Cyanocobalamin 5000 MCG SUBL Place 1 tablet under the tongue daily.    . diphenhydrAMINE (BENADRYL) 25 MG tablet Take 25 mg by mouth every 6 (six) hours as needed.    Marland Kitchen KRILL OIL PO Take 1 capsule by mouth daily.    Marland Kitchen MAGNESIUM GLUCONATE PO Take 400 mg  by mouth.    . Multiple Vitamins-Minerals (MULTIVITAMIN WITH MINERALS) tablet Take 1 tablet by mouth 2 (two) times daily.    . naproxen sodium (ANAPROX) 220 MG tablet Take 220 mg by mouth daily.    . Probiotic Product (PROBIOTIC DAILY PO) Take by mouth daily.     No facility-administered medications prior to visit.     Review of Systems;  Patient denies headache, fevers, malaise, unintentional weight loss, skin rash, eye pain, sinus congestion and sinus pain, sore throat, dysphagia,  hemoptysis , cough, dyspnea, wheezing, chest pain, palpitations, orthopnea, edema, abdominal pain, nausea, melena, diarrhea, constipation, flank pain, dysuria, hematuria, urinary  Frequency, nocturia, numbness, tingling, seizures,  Focal weakness, Loss of consciousness,  Tremor, insomnia, depression, anxiety, and suicidal ideation.      Objective:  BP (!) 146/80 (BP Location: Left Arm, Patient Position: Sitting, Cuff Size: Normal)   Pulse 75   Temp 98 F (36.7 C) (Oral)   Resp 14   Ht 5\' 6"  (1.676 m)   Wt 183 lb 3.2 oz (83.1 kg)   SpO2 97%   BMI 29.57 kg/m   BP Readings from Last 3 Encounters:  11/20/18 (!) 146/80  05/14/18 136/76  11/11/17 126/78    Wt Readings from Last 3 Encounters:  11/20/18 183 lb 3.2 oz (83.1 kg)  05/14/18 180 lb (81.6 kg)  11/11/17 176 lb 6.4 oz (80  kg)    General appearance: alert, cooperative and appears stated age Ears: normal TM's and external ear canals both ears Throat: lips, mucosa, and tongue normal; teeth and gums normal Neck: no adenopathy, no carotid bruit, supple, symmetrical, trachea midline and thyroid not enlarged, symmetric, no tenderness/mass/nodules Back: symmetric, no curvature. ROM normal. No CVA tenderness. Lungs: clear to auscultation bilaterally Heart: regular rate and rhythm, S1, S2 normal, no murmur, click, rub or gallop Abdomen: soft, non-tender; bowel sounds normal; no masses,  no organomegaly Pulses: 2+ and symmetric Skin: Skin color,  texture, turgor normal. No rashes or lesions Lymph nodes: Cervical, supraclavicular, and axillary nodes normal.  Lab Results  Component Value Date   HGBA1C 6.5 11/20/2018   HGBA1C 6.5 05/14/2018   HGBA1C 5.7 11/11/2017    Lab Results  Component Value Date   CREATININE 0.80 11/20/2018   CREATININE 0.81 05/14/2018   CREATININE 0.77 11/11/2017    Lab Results  Component Value Date   WBC 6.7 05/14/2018   HGB 13.6 05/14/2018   HCT 39.8 05/14/2018   PLT 354.0 05/14/2018   GLUCOSE 110 (H) 11/20/2018   CHOL 221 (H) 11/20/2018   TRIG 42.0 11/20/2018   HDL 91.50 11/20/2018   LDLCALC 121 (H) 11/20/2018   ALT 13 11/20/2018   AST 14 11/20/2018   NA 139 11/20/2018   K 4.5 11/20/2018   CL 103 11/20/2018   CREATININE 0.80 11/20/2018   BUN 18 11/20/2018   CO2 30 11/20/2018   TSH 1.80 05/14/2018   HGBA1C 6.5 11/20/2018   MICROALBUR <0.7 11/20/2018    Mm 3d Screen Breast Bilateral  Result Date: 06/09/2018 CLINICAL DATA:  Screening. EXAM: DIGITAL SCREENING BILATERAL MAMMOGRAM WITH TOMO AND CAD COMPARISON:  Previous exam(s). ACR Breast Density Category c: The breast tissue is heterogeneously dense, which may obscure small masses. FINDINGS: There are no findings suspicious for malignancy. Images were processed with CAD. IMPRESSION: No mammographic evidence of malignancy. A result letter of this screening mammogram will be mailed directly to the patient. RECOMMENDATION: Screening mammogram in one year. (Code:SM-B-01Y) BI-RADS CATEGORY  1: Negative. Electronically Signed   By: Franki Cabot M.D.   On: 06/09/2018 09:23    Assessment & Plan:   Problem List Items Addressed This Visit    Numbness of left hand    Secondary to DDD cervical spine .  Degenerative disc and facet disease changes of the cervical spine with multilevel uncovertebral spur formation, greatest at LEFT C6-C7.  MRI and EMG/[place] studies offered as next appropriate steps o confirm,  But patient has deferred,       Diabetes  mellitus without complication ([place]) - Primary    She has gained weight but her a1c has remained at  6.5 .  I have  encouraged  Continued weight loss with goal of 10% of body weigh over the next 6 months using a low glycemic index diet and regular exercise a minimum of 5 days per week.    Lab Results  Component Value Date   HGBA1C 6.5 11/20/2018   Lab Results  Component Value Date   MICROALBUR <0.7 11/20/2018         Relevant Medications   telmisartan (MICARDIS) 20 MG tablet   Other Relevant Orders   Hemoglobin A1c (Completed)   Comprehensive metabolic panel (Completed)   Lipid panel (Completed)   Microalbumin / creatinine urine ratio (Completed)   Hypertension    New onset.  Starting telmsartan for hypertensio.n  Follow up bmet needed in one week  Lab Results  Component Value Date   CREATININE 0.80 11/20/2018   Lab Results  Component Value Date   NA 139 11/20/2018   K 4.5 11/20/2018   CL 103 11/20/2018   CO2 30 11/20/2018         Relevant Medications   telmisartan (MICARDIS) 20 MG tablet   Hyperlipidemia associated with type 2 diabetes mellitus ([place])    based on current lipid profile, the risk of clinically significant Coronary artery disease is 21% over the next 10 years, using the Framingham risk calculator.  Statin advised   Lab Results  Component Value Date   CHOL 221 (H) 11/20/2018   HDL 91.50 11/20/2018   LDLCALC 121 (H) 11/20/2018   TRIG 42.0 11/20/2018   CHOLHDL 2 11/20/2018         Relevant Medications   telmisartan (MICARDIS) 20 MG tablet      I am having Aolanis S. Retherford start on telmisartan. I am also having her maintain her Vitamin D3, naproxen sodium, KRILL OIL PO, Cyanocobalamin, Cranberry, aspirin, multivitamin with minerals, vitamin C, Probiotic Product (PROBIOTIC DAILY PO), diphenhydrAMINE, and MAGNESIUM GLUCONATE PO.  Meds ordered this encounter  Medications  . telmisartan (MICARDIS) 20 MG tablet    Sig: Take 1 tablet (20 mg total) by  mouth at bedtime.    Dispense:  90 tablet    Refill:  1    There are no discontinued medications.  Follow-up: Return in about 6 months (around 05/23/2019).   Crecencio Mc, MD

## 2018-11-20 NOTE — Patient Instructions (Signed)
  The new goals for optimal blood pressure management are 120/70.  I would like you to start taking telmisartan 20 mg once daily in the evening  instead of morning,  Because a well controlled recent study noted that patients  Who took their blood pressure medications at night had fewer   heart attacks and strokes.   Your cervical spine disease is likely progressing to spinal stenosis,  This will need an MRI to confirm, when you are ready

## 2018-11-22 DIAGNOSIS — I10 Essential (primary) hypertension: Secondary | ICD-10-CM | POA: Insufficient documentation

## 2018-11-22 DIAGNOSIS — E1169 Type 2 diabetes mellitus with other specified complication: Secondary | ICD-10-CM | POA: Insufficient documentation

## 2018-11-22 DIAGNOSIS — E785 Hyperlipidemia, unspecified: Secondary | ICD-10-CM

## 2018-11-22 NOTE — Assessment & Plan Note (Addendum)
Secondary to DDD cervical spine .  Degenerative disc and facet disease changes of the cervical spine with multilevel uncovertebral spur formation, greatest at LEFT C6-C7.  MRI and EMG/[place] studies offered as next appropriate steps o confirm,  But patient has deferred,

## 2018-11-22 NOTE — Assessment & Plan Note (Addendum)
She has gained weight but her a1c has remained at  6.5 .  I have  encouraged  Continued weight loss with goal of 10% of body weigh over the next 6 months using a low glycemic index diet and regular exercise a minimum of 5 days per week.    Lab Results  Component Value Date   HGBA1C 6.5 11/20/2018   Lab Results  Component Value Date   MICROALBUR <0.7 11/20/2018    

## 2018-11-22 NOTE — Assessment & Plan Note (Signed)
New onset.  Starting telmsartan for hypertensio.n  Follow up bmet needed in one week  Lab Results  Component Value Date   CREATININE 0.80 11/20/2018   Lab Results  Component Value Date   NA 139 11/20/2018   K 4.5 11/20/2018   CL 103 11/20/2018   CO2 30 11/20/2018

## 2018-11-22 NOTE — Assessment & Plan Note (Signed)
based on current lipid profile, the risk of clinically significant Coronary artery disease is 21% over the next 10 years, using the Framingham risk calculator.  Statin advised   Lab Results  Component Value Date   CHOL 221 (H) 11/20/2018   HDL 91.50 11/20/2018   LDLCALC 121 (H) 11/20/2018   TRIG 42.0 11/20/2018   CHOLHDL 2 11/20/2018

## 2019-03-16 ENCOUNTER — Other Ambulatory Visit: Payer: Self-pay

## 2019-03-16 ENCOUNTER — Ambulatory Visit (INDEPENDENT_AMBULATORY_CARE_PROVIDER_SITE_OTHER): Payer: BLUE CROSS/BLUE SHIELD | Admitting: Internal Medicine

## 2019-03-16 ENCOUNTER — Encounter: Payer: Self-pay | Admitting: Internal Medicine

## 2019-03-16 DIAGNOSIS — M47812 Spondylosis without myelopathy or radiculopathy, cervical region: Secondary | ICD-10-CM | POA: Diagnosis not present

## 2019-03-16 DIAGNOSIS — E1169 Type 2 diabetes mellitus with other specified complication: Secondary | ICD-10-CM

## 2019-03-16 DIAGNOSIS — E119 Type 2 diabetes mellitus without complications: Secondary | ICD-10-CM

## 2019-03-16 DIAGNOSIS — E785 Hyperlipidemia, unspecified: Secondary | ICD-10-CM

## 2019-03-16 DIAGNOSIS — R200 Anesthesia of skin: Secondary | ICD-10-CM | POA: Diagnosis not present

## 2019-03-16 DIAGNOSIS — I10 Essential (primary) hypertension: Secondary | ICD-10-CM | POA: Diagnosis not present

## 2019-03-16 MED ORDER — RED YEAST RICE EXTRACT 600 MG PO CAPS: 1.0000 | ORAL_CAPSULE | Freq: Two times a day (BID) | ORAL | 3 refills | Status: DC

## 2019-03-16 MED ORDER — TELMISARTAN 20 MG PO TABS: 20.0000 mg | ORAL_TABLET | Freq: Every day | ORAL | 1 refills | Status: DC

## 2019-03-16 NOTE — Assessment & Plan Note (Signed)
She has gained weight but her a1c has remained at  6.5 .  I have  encouraged  Continued weight loss with goal of 10% of body weigh over the next 6 months using a low glycemic index diet and regular exercise a minimum of 5 days per week.    Lab Results  Component Value Date   HGBA1C 6.5 11/20/2018   Lab Results  Component Value Date   MICROALBUR <0.7 11/20/2018

## 2019-03-16 NOTE — Assessment & Plan Note (Signed)
Not at goal..  Staring telmisartan 20 mg daily qhs

## 2019-03-16 NOTE — Assessment & Plan Note (Signed)
She defers statin therapy .  Trial of RYR recommended

## 2019-03-16 NOTE — Patient Instructions (Signed)
Goal for blood pressure is 120/70.  Starting Telmisartan 20 mg at bedtime  Return in one week for non fasting labs   For your cholesterol  The natural remedies for cholesterol have not been proven to reduce your risk for a heart attack.  Red Yeast Rice has not been proven either,  But does lower cholesterol, so if you want to try it , the dose is 600 mg twice daily in capsule form, available OTC.    Repeat labs (fasting) in 3 months,  Prior to next visit.   MRI CERVICAL SPINE ORDERED

## 2019-03-16 NOTE — Progress Notes (Signed)
Virtual Visit via [email or web address]  This visit type was conducted due to national recommendations for restrictions regarding the COVID-19 pandemic (e.g. social distancing).  This format is felt to be most appropriate for this patient at this time.  All issues noted in this document were discussed and addressed.  No physical exam was performed (except for noted visual exam findings with Video Visits).   I connected with@ on 03/16/19 at  8:00 AM EDT by a video enabled telemedicine application and verified that I am speaking with the correct person using two identifiers. Location patient: home Location provider: work or home office Persons participating in the virtual visit: patient, provider  I discussed the limitations, risks, security and privacy concerns of performing an evaluation and management service by telephone and the availability of in person appointments. I also discussed with the patient that there may be a patient responsible charge related to this service. The patient expressed understanding and agreed to proceed.  Reason for visit: follow up  HPI:  63 yr old female with diet controlled  T2DM, hypertension, hyperlipidemia and overweight  Reached a nadir of 175 but now 181 due to lack of exercise due to orthopedic issues.  BP at home has been averaging 135/75 without bp .  Did not start telmisartan   Does not want statin.  Discussed trial of RYR   Neck has been bothering her more.  Left hand numbness and now with bilateral Lower extremity radiculopathy  R > L    ROS: Patient denies headache, fevers, malaise, unintentional weight loss, skin rash, eye pain, sinus congestion and sinus pain, sore throat, dysphagia,  hemoptysis , cough, dyspnea, wheezing, chest pain, palpitations, orthopnea, edema, abdominal pain, nausea, melena, diarrhea, constipation, flank pain, dysuria, hematuria, urinary  Frequency, nocturia, numbness, tingling, seizures,  Focal weakness, Loss of consciousness,  Tremor,  insomnia, depression, anxiety, and suicidal ideation.     Past Medical History:  Diagnosis Date  . Arthritis    mild  . GERD (gastroesophageal reflux disease)    occasional  . Wears contact lenses     Past Surgical History:  Procedure Laterality Date  . COLONOSCOPY  2006  . COLONOSCOPY N/A 01/03/2016   Procedure: COLONOSCOPY;  Surgeon: Hulen Luster, MD;  Location: [facility];  Service: Gastroenterology;  Laterality: N/A;  . fatty tissue under bilateral arm      Family History  Problem Relation Age of Onset  . Hyperlipidemia Mother   . Hypertension Mother   . Diabetes Mother   . Cancer Father 30       colon  . Breast cancer Sister 86     SOCIAL HX: widowed,  Lives with daughter Mickel Baas   reports that she has never smoked. She has never used smokeless tobacco. She reports current alcohol use of about 10.0 standard drinks of alcohol per week. She reports that she does not use drugs.   Current Outpatient Medications:  .  Ascorbic Acid (VITAMIN C) 1000 MG tablet, Take 1,000 mg by mouth daily., Disp: , Rfl:  .  aspirin 81 MG tablet, Take 81 mg by mouth daily., Disp: , Rfl:  .  Cranberry (SM CRANBERRY) 300 MG tablet, Take 300 mg by mouth daily., Disp: , Rfl:  .  Cyanocobalamin 5000 MCG SUBL, Place 1 tablet under the tongue daily., Disp: , Rfl:  .  diphenhydrAMINE (BENADRYL) 25 MG tablet, Take 25 mg by mouth every 6 (six) hours as needed., Disp: , Rfl:  .  KRILL OIL PO, Take  1 capsule by mouth daily., Disp: , Rfl:  .  MAGNESIUM GLUCONATE PO, Take 400 mg by mouth., Disp: , Rfl:  .  Multiple Vitamins-Minerals (MULTIVITAMIN WITH MINERALS) tablet, Take 1 tablet by mouth 2 (two) times daily., Disp: , Rfl:  .  naproxen sodium (ANAPROX) 220 MG tablet, Take 220 mg by mouth daily., Disp: , Rfl:  .  Probiotic Product (PROBIOTIC DAILY PO), Take by mouth daily., Disp: , Rfl:  .  Cholecalciferol (VITAMIN D3) 10000 UNITS capsule, Take 10,000 Units by mouth daily. Reported on 12/27/2015, Disp: ,  Rfl:  .  Red Yeast Rice Extract 600 MG CAPS, Take 1 capsule (600 mg total) by mouth 2 (two) times daily at 10 AM and 5 PM., Disp: 60 capsule, Rfl: 3 .  telmisartan (MICARDIS) 20 MG tablet, Take 1 tablet (20 mg total) by mouth at bedtime., Disp: 90 tablet, Rfl: 1  EXAM:  VITALS per patient if applicable:  GENERAL: alert, oriented, appears well and in no acute distress  HEENT: atraumatic, conjunttiva clear, no obvious abnormalities on inspection of external nose and ears  NECK: normal movements of the head and neck  LUNGS: on inspection no signs of respiratory distress, breathing rate appears normal, no obvious gross SOB, gasping or wheezing  CV: no obvious cyanosis  MS: moves all visible extremities without noticeable abnormality  PSYCH/NEURO: pleasant and cooperative, no obvious depression or anxiety, speech and thought processing grossly intact  ASSESSMENT AND PLAN: Cervical spine arthritis (HCC) Secondary to degenerative changes, by plain films, with radiculopathy to left hand b and now bilateral legs .  MRI ordered   Hypertension Not at goal..  Staring telmisartan 20 mg daily qhs   Hyperlipidemia associated with type 2 diabetes mellitus ([place]) She defers statin therapy .  Trial of RYR recommended  Diabetes mellitus without complication ([place]) She has gained weight but her a1c has remained at  6.5 .  I have  encouraged  Continued weight loss with goal of 10% of body weigh over the next 6 months using a low glycemic index diet and regular exercise a minimum of 5 days per week.    Lab Results  Component Value Date   HGBA1C 6.5 11/20/2018   Lab Results  Component Value Date   MICROALBUR <0.7 11/20/2018     I discussed the assessment and treatment plan with the patient. The patient was provided an opportunity to ask questions and all were answered. The patient agreed with the plan and demonstrated an understanding of the instructions.   The patient was advised to call back  or seek an in-person evaluation if the symptoms worsen or if the condition fails to improve as anticipated.  I provided 25 minutes of non-face-to-face time during this encounter.   Crecencio Mc, MD

## 2019-03-16 NOTE — Assessment & Plan Note (Signed)
Secondary to degenerative changes, by plain films, with radiculopathy to left hand b and now bilateral legs .  MRI ordered

## 2019-03-17 ENCOUNTER — Telehealth: Payer: Self-pay

## 2019-03-17 NOTE — Telephone Encounter (Signed)
LMTCB. Nee to schedule pt for a non fasting lab appt in 1 week.

## 2019-03-23 NOTE — Telephone Encounter (Signed)
Pt returned call.  Pt said that she is not having symptoms today.  Pt said that on Thursday she was moving boxes and had a lot of pain in her right leg when standing and putting pressure on it.  No pain on Friday.  Saturday a little pain but went way.  Today a only a little pain in the right knee.  Pt said that she does stretches everyday.  Pt thinks that pain from last Thursday was coming from moving boxes.  Pt said that she has discussed referral for MRI before but appt was put off due to [place].  It looks like MRI has been ordered in pt's chart.  Pt also said that she started Telmisartan last Thursday and was told by Dr. Derrel Nip to scheduled a lab appt 7 days after starting medication.  Pt was scheduled lab appt for this Thursday 03/26/19 @ 2:15 pm.  Patient advised to call office back if leg symptoms return and to follow-up with MRI once scheduled.

## 2019-03-23 NOTE — Telephone Encounter (Signed)
Left message for pt to call back  °

## 2019-03-23 NOTE — Telephone Encounter (Signed)
Left message to call back  

## 2019-03-23 NOTE — Telephone Encounter (Signed)
Reviewed Kathy [name]'s note.  MRI ordered.  Labs appt scheduled.  It appears she is not having any pain now.  Let me know if I need to do anything more.  Confirm no acute change or issues.

## 2019-03-26 ENCOUNTER — Other Ambulatory Visit (INDEPENDENT_AMBULATORY_CARE_PROVIDER_SITE_OTHER): Payer: BLUE CROSS/BLUE SHIELD

## 2019-03-26 ENCOUNTER — Other Ambulatory Visit: Payer: Self-pay

## 2019-03-26 DIAGNOSIS — I10 Essential (primary) hypertension: Secondary | ICD-10-CM | POA: Diagnosis not present

## 2019-03-26 LAB — BASIC METABOLIC PANEL
BUN: 18 mg/dL (ref 6–23)
CO2: 29 mEq/L (ref 19–32)
Calcium: 9.4 mg/dL (ref 8.4–10.5)
Chloride: 101 mEq/L (ref 96–112)
Creatinine, Ser: 1.02 mg/dL (ref 0.40–1.20)
GFR: 54.52 mL/min — ABNORMAL LOW (ref >60.00)
Glucose, Bld: 94 mg/dL (ref 70–99)
Potassium: 4 mEq/L (ref 3.5–5.1)
Sodium: 137 mEq/L (ref 135–145)

## 2019-03-29 ENCOUNTER — Other Ambulatory Visit: Payer: Self-pay | Admitting: Internal Medicine

## 2019-03-29 DIAGNOSIS — R944 Abnormal results of kidney function studies: Secondary | ICD-10-CM

## 2019-04-02 ENCOUNTER — Other Ambulatory Visit: Payer: Self-pay

## 2019-04-02 ENCOUNTER — Ambulatory Visit
Admission: RE | Admit: 2019-04-02 | Discharge: 2019-04-02 | Disposition: A | Discharge status: Home / Self Care | Payer: BLUE CROSS/BLUE SHIELD | Source: Ambulatory Visit | Attending: Internal Medicine | Admitting: Internal Medicine

## 2019-04-02 DIAGNOSIS — R200 Anesthesia of skin: Secondary | ICD-10-CM | POA: Diagnosis not present

## 2019-04-02 DIAGNOSIS — M47812 Spondylosis without myelopathy or radiculopathy, cervical region: Secondary | ICD-10-CM | POA: Diagnosis present

## 2019-04-03 ENCOUNTER — Telehealth: Payer: Self-pay | Admitting: Internal Medicine

## 2019-04-03 ENCOUNTER — Other Ambulatory Visit: Payer: Self-pay | Admitting: Internal Medicine

## 2019-04-03 DIAGNOSIS — D492 Neoplasm of unspecified behavior of bone, soft tissue, and skin: Secondary | ICD-10-CM

## 2019-04-03 NOTE — Telephone Encounter (Signed)
Patient was given results left on VM by PCP and lab scheduled for Monday. FYI

## 2019-04-03 NOTE — Telephone Encounter (Signed)
Please change orders to "Future" prior to her appt.

## 2019-04-03 NOTE — Telephone Encounter (Signed)
Traci from imaging called back with an addendum to yesterday imaging report. Copied and pasted below.     ADDENDUM REPORT: 04/03/2019 10:39  ADDENDUM: 1.3 cm lesion in the T2 vertebral body. If the patient has a history of malignancy, this would be concerning for a metastasis and nuclear medicine whole-body bone scan may be useful for further evaluation. If the patient does not have a known or suspected malignancy, postcontrast spine MRI and laboratory evaluation for multiple myeloma may be considered.

## 2019-04-03 NOTE — Telephone Encounter (Signed)
Unable to reach patient by phone left message to all office.

## 2019-04-03 NOTE — Assessment & Plan Note (Signed)
Voice mail left on patient's cell phone regarding the abnormal vertebral lesion in the T2 ertebra reported by radiology as an addendum.   I am ordering the post contrast MRI suggested.  The ddx inludes multiple myeloma,  Less likely mestastati aner sine she has no known history of cancer and is up to date on screenings.   Urine and blood tests need to be done as well to rule out multiple myeloma,  Please call patient to let her know I have ordered these and arrange a lab visit

## 2019-04-03 NOTE — Telephone Encounter (Signed)
Thank you :)

## 2019-04-03 NOTE — Telephone Encounter (Signed)
Voice mail left on patient's cell phone regarding the abnormal vertebral lesion in the T2 ertebra reported by radiology as an addendum.   I am ordering the post contrast MRI suggested.  The ddx inludes multiple myeloma,  Less likely mestastati aner sine she has no known history of cancer and is up to date on screenings.   Urine and blood tests need to be done as well to rule out multiple myeloma,  Please call patient to let her know I have ordered these and arrange a lab visit

## 2019-04-06 ENCOUNTER — Other Ambulatory Visit: Payer: Self-pay

## 2019-04-06 ENCOUNTER — Other Ambulatory Visit (INDEPENDENT_AMBULATORY_CARE_PROVIDER_SITE_OTHER): Payer: BLUE CROSS/BLUE SHIELD

## 2019-04-06 DIAGNOSIS — D492 Neoplasm of unspecified behavior of bone, soft tissue, and skin: Secondary | ICD-10-CM

## 2019-04-06 DIAGNOSIS — R944 Abnormal results of kidney function studies: Secondary | ICD-10-CM

## 2019-04-06 LAB — BASIC METABOLIC PANEL
BUN: 12 mg/dL (ref 6–23)
CO2: 28 mEq/L (ref 19–32)
Calcium: 9.4 mg/dL (ref 8.4–10.5)
Chloride: 95 mEq/L — ABNORMAL LOW (ref 96–112)
Creatinine, Ser: 0.7 mg/dL (ref 0.40–1.20)
GFR: 84.17 mL/min (ref >60.00)
Glucose, Bld: 105 mg/dL — ABNORMAL HIGH (ref 70–99)
Potassium: 4.1 mEq/L (ref 3.5–5.1)
Sodium: 131 mEq/L — ABNORMAL LOW (ref 135–145)

## 2019-04-06 LAB — CBC WITH DIFFERENTIAL/PLATELET
Basophils Absolute: 0 10*3/uL (ref 0.0–0.1)
Basophils Relative: 0.5 % (ref 0.0–3.0)
Eosinophils Absolute: 0 10*3/uL (ref 0.0–0.7)
Eosinophils Relative: 0.9 % (ref 0.0–5.0)
HCT: 35.1 % — ABNORMAL LOW (ref 36.0–46.0)
Hemoglobin: 11.8 g/dL — ABNORMAL LOW (ref 12.0–15.0)
Lymphocytes Relative: 29.6 % (ref 12.0–46.0)
Lymphs Abs: 1.5 10*3/uL (ref 0.7–4.0)
MCHC: 33.7 g/dL (ref 30.0–36.0)
MCV: 86.3 fl (ref 78.0–100.0)
Monocytes Absolute: 0.4 10*3/uL (ref 0.1–1.0)
Monocytes Relative: 7.2 % (ref 3.0–12.0)
Neutro Abs: 3.2 10*3/uL (ref 1.4–7.7)
Neutrophils Relative %: 61.8 % (ref 43.0–77.0)
Platelets: 330 10*3/uL (ref 150.0–400.0)
RBC: 4.07 Mil/uL (ref 3.87–5.11)
RDW: 13.5 % (ref 11.5–15.5)
WBC: 5.1 10*3/uL (ref 4.0–10.5)

## 2019-04-06 NOTE — Addendum Note (Signed)
Addended by: Leeanne Rio on: 04/06/2019 11:31 AM   Modules accepted: Orders

## 2019-04-06 NOTE — Addendum Note (Signed)
Addended by: Leeanne Rio on: 04/06/2019 11:30 AM   Modules accepted: Orders

## 2019-04-08 NOTE — Addendum Note (Signed)
Addended by: Leeanne Rio on: 04/08/2019 01:51 PM   Modules accepted: Orders

## 2019-04-09 ENCOUNTER — Other Ambulatory Visit: Payer: BLUE CROSS/BLUE SHIELD

## 2019-04-09 LAB — PROTEIN ELECTROPHORESIS, SERUM
A/G Ratio: 1.5 (ref 0.7–1.7)
Albumin ELP: 4 g/dL (ref 2.9–4.4)
Alpha 1: 0.2 g/dL (ref 0.0–0.4)
Alpha 2: 0.9 g/dL (ref 0.4–1.0)
Beta: 0.9 g/dL (ref 0.7–1.3)
Gamma Globulin: 0.8 g/dL (ref 0.4–1.8)
Globulin, Total: 2.7 g/dL (ref 2.2–3.9)
Total Protein: 6.7 g/dL (ref 6.0–8.5)

## 2019-04-09 LAB — IMMUNOFIXATION, SERUM
IgA/Immunoglobulin A, Serum: 69 mg/dL — ABNORMAL LOW (ref 87–352)
IgG (Immunoglobin G), Serum: 904 mg/dL (ref 586–1602)
IgM (Immunoglobulin M), Srm: 44 mg/dL (ref 26–217)

## 2019-04-10 LAB — IMMUNOFIXATION, URINE

## 2019-04-10 LAB — UPEP/TP, 24-HR URINE
Albumin, U: 100 %
Alpha 1, Urine: 0 %
Alpha 2, Urine: 0 %
Beta, Urine: 0 %
Gamma Globulin, Urine: 0 %
Protein, 24H Urine: 309 mg/24 hr — ABNORMAL HIGH (ref 30–150)
Protein, Ur: 10.3 mg/dL

## 2019-04-16 ENCOUNTER — Ambulatory Visit
Admission: RE | Admit: 2019-04-16 | Discharge: 2019-04-16 | Disposition: A | Discharge status: Home / Self Care | Payer: BLUE CROSS/BLUE SHIELD | Source: Ambulatory Visit | Attending: Internal Medicine | Admitting: Internal Medicine

## 2019-04-16 ENCOUNTER — Other Ambulatory Visit: Payer: Self-pay

## 2019-04-16 DIAGNOSIS — D492 Neoplasm of unspecified behavior of bone, soft tissue, and skin: Secondary | ICD-10-CM | POA: Diagnosis not present

## 2019-04-16 MED ORDER — GADOBUTROL 1 MMOL/ML IV SOLN
8.0000 mL | Freq: Once | INTRAVENOUS | Status: AC | PRN
  Administered 2019-04-16: 8 mL via INTRAVENOUS

## 2019-04-17 ENCOUNTER — Other Ambulatory Visit: Payer: Self-pay | Admitting: Internal Medicine

## 2019-04-17 DIAGNOSIS — D492 Neoplasm of unspecified behavior of bone, soft tissue, and skin: Secondary | ICD-10-CM

## 2019-04-23 ENCOUNTER — Other Ambulatory Visit: Payer: Self-pay

## 2019-04-24 ENCOUNTER — Inpatient Hospital Stay: Payer: BLUE CROSS/BLUE SHIELD | Attending: Oncology | Admitting: Oncology

## 2019-04-24 ENCOUNTER — Ambulatory Visit
Admission: RE | Admit: 2019-04-24 | Discharge: 2019-04-24 | Disposition: A | Discharge status: Home / Self Care | Payer: BLUE CROSS/BLUE SHIELD | Attending: Oncology | Admitting: Oncology

## 2019-04-24 ENCOUNTER — Ambulatory Visit
Admission: RE | Admit: 2019-04-24 | Discharge: 2019-04-24 | Disposition: A | Discharge status: Home / Self Care | Payer: BLUE CROSS/BLUE SHIELD | Source: Ambulatory Visit | Attending: Oncology | Admitting: Oncology

## 2019-04-24 ENCOUNTER — Other Ambulatory Visit: Payer: Self-pay

## 2019-04-24 ENCOUNTER — Encounter: Payer: Self-pay | Admitting: Oncology

## 2019-04-24 ENCOUNTER — Inpatient Hospital Stay: Payer: BLUE CROSS/BLUE SHIELD

## 2019-04-24 VITALS — BP 133/83 | HR 77 | Temp 96.9°F | Wt 181.0 lb

## 2019-04-24 DIAGNOSIS — Z7289 Other problems related to lifestyle: Secondary | ICD-10-CM

## 2019-04-24 DIAGNOSIS — M79604 Pain in right leg: Secondary | ICD-10-CM | POA: Diagnosis not present

## 2019-04-24 DIAGNOSIS — M899 Disorder of bone, unspecified: Secondary | ICD-10-CM

## 2019-04-24 DIAGNOSIS — M4804 Spinal stenosis, thoracic region: Secondary | ICD-10-CM | POA: Insufficient documentation

## 2019-04-24 DIAGNOSIS — Z833 Family history of diabetes mellitus: Secondary | ICD-10-CM

## 2019-04-24 DIAGNOSIS — Z800 Family history of malignant neoplasm of digestive organs: Secondary | ICD-10-CM | POA: Diagnosis not present

## 2019-04-24 DIAGNOSIS — D649 Anemia, unspecified: Secondary | ICD-10-CM | POA: Diagnosis not present

## 2019-04-24 DIAGNOSIS — R200 Anesthesia of skin: Secondary | ICD-10-CM | POA: Diagnosis not present

## 2019-04-24 DIAGNOSIS — R937 Abnormal findings on diagnostic imaging of other parts of musculoskeletal system: Secondary | ICD-10-CM | POA: Insufficient documentation

## 2019-04-24 DIAGNOSIS — M4722 Other spondylosis with radiculopathy, cervical region: Secondary | ICD-10-CM | POA: Insufficient documentation

## 2019-04-24 DIAGNOSIS — M4802 Spinal stenosis, cervical region: Secondary | ICD-10-CM | POA: Diagnosis not present

## 2019-04-24 DIAGNOSIS — R894 Abnormal immunological findings in specimens from other organs, systems and tissues: Secondary | ICD-10-CM | POA: Diagnosis not present

## 2019-04-24 DIAGNOSIS — Z808 Family history of malignant neoplasm of other organs or systems: Secondary | ICD-10-CM

## 2019-04-24 DIAGNOSIS — Z8249 Family history of ischemic heart disease and other diseases of the circulatory system: Secondary | ICD-10-CM

## 2019-04-24 DIAGNOSIS — Z803 Family history of malignant neoplasm of breast: Secondary | ICD-10-CM

## 2019-04-24 LAB — CBC WITH DIFFERENTIAL/PLATELET
Abs Immature Granulocytes: 0.02 10*3/uL (ref 0.00–0.07)
Basophils Absolute: 0 10*3/uL (ref 0.0–0.1)
Basophils Relative: 1 %
Eosinophils Absolute: 0.1 10*3/uL (ref 0.0–0.5)
Eosinophils Relative: 2 %
HCT: 36.2 % (ref 36.0–46.0)
Hemoglobin: 12 g/dL (ref 12.0–15.0)
Immature Granulocytes: 0 %
Lymphocytes Relative: 29 %
Lymphs Abs: 1.5 10*3/uL (ref 0.7–4.0)
MCH: 28.6 pg (ref 26.0–34.0)
MCHC: 33.1 g/dL (ref 30.0–36.0)
MCV: 86.2 fL (ref 80.0–100.0)
Monocytes Absolute: 0.3 10*3/uL (ref 0.1–1.0)
Monocytes Relative: 6 %
Neutro Abs: 3.3 10*3/uL (ref 1.7–7.7)
Neutrophils Relative %: 62 %
Platelets: 347 10*3/uL (ref 150–400)
RBC: 4.2 MIL/uL (ref 3.87–5.11)
RDW: 13.4 % (ref 11.5–15.5)
WBC: 5.3 10*3/uL (ref 4.0–10.5)
nRBC: 0 % (ref 0.0–0.2)

## 2019-04-24 LAB — HEPATIC FUNCTION PANEL
ALT: 13 U/L (ref 0–44)
AST: 16 U/L (ref 15–41)
Albumin: 4.5 g/dL (ref 3.5–5.0)
Alkaline Phosphatase: 92 U/L (ref 38–126)
Bilirubin, Direct: <0.1 mg/dL (ref 0.0–0.2)
Total Bilirubin: 0.5 mg/dL (ref 0.3–1.2)
Total Protein: 7.9 g/dL (ref 6.5–8.1)

## 2019-04-24 LAB — RETIC PANEL
Immature Retic Fract: 5.1 % (ref 2.3–15.9)
RBC.: 4.2 MIL/uL (ref 3.87–5.11)
Retic Count, Absolute: 63.4 10*3/uL (ref 19.0–186.0)
Retic Ct Pct: 1.5 % (ref 0.4–3.1)
Reticulocyte Hemoglobin: 33.3 pg (ref >27.9)

## 2019-04-24 NOTE — Progress Notes (Signed)
Hematology/Oncology Consult note [facility] Telephone:([phone] [phone]   Patient Care Team: Crecencio Mc, MD as PCP - General (Internal Medicine) Crecencio Mc, MD (Internal Medicine) Bary Castilla Forest Gleason, MD (General Surgery)  REFERRING PROVIDER: Crecencio Mc, MD  CHIEF COMPLAINTS/REASON FOR VISIT:  Evaluation of bone lesion.   HISTORY OF PRESENTING ILLNESS:   Kathy [name] is a  63 y.o.  female with PMH listed below was seen in consultation at the request of  Crecencio Mc, MD  for evaluation of bone lesion.  Patients reports having bilateral hand numbness, progressively worsened and now have bilateral lower extrememity radiculopathy R>L.  04/02/2019 Cervical Spine MRI wo contrast showed 1.3cm T2 vertebral body lesion.  04/16/2019 thoracic spine w and wo contrast showed non specific 1.3cm T1/T2 hypointense lesion within T2 vertebral body. 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 Metastatic lesion and myelpma remain in the differential.  Mild thoracic spondylosis with might right foraminal and mild canal stenosis at T10-11 level.   Patient's PCP has ordered myeloma work up.  04/06/2019 SPEP showed negative M spike, immunofixation is unremarkable. Mildly decreased IgA.  24 hour urine UPEP negative M spike, IFE urine unremarkable.    Review of Systems  Constitutional: Negative for appetite change, chills, fatigue and fever.  HENT:   Negative for hearing loss and voice change.   Eyes: Negative for eye problems.  Respiratory: Negative for chest tightness and cough.   Cardiovascular: Negative for chest pain.  Gastrointestinal: Negative for abdominal distention, abdominal pain and blood in stool.  Endocrine: Negative for hot flashes.  Genitourinary: Negative for difficulty urinating and frequency.   Musculoskeletal: Negative for arthralgias.  Skin: Negative for itching and rash.  Neurological:  Positive for numbness. Negative for extremity weakness.  Hematological: Negative for adenopathy.  Psychiatric/Behavioral: Negative for confusion.    MEDICAL HISTORY:  Past Medical History:  Diagnosis Date   Arthritis    mild   GERD (gastroesophageal reflux disease)    occasional   Wears contact lenses     SURGICAL HISTORY: Past Surgical History:  Procedure Laterality Date   COLONOSCOPY  2006   COLONOSCOPY N/A 01/03/2016   Procedure: COLONOSCOPY;  Surgeon: Hulen Luster, MD;  Location: [place];  Service: Gastroenterology;  Laterality: N/A;   fatty tissue under bilateral arm      SOCIAL HISTORY: Social History   Socioeconomic History   Marital status: Widowed    Spouse name: Not on file   Number of children: Not on file   Years of education: Not on file   Highest education level: Not on file  Occupational History   Not on file  Social Needs   Financial resource strain: Not on file   Food insecurity    Worry: Not on file    Inability: Not on file   Transportation needs    Medical: Not on file    Non-medical: Not on file  Tobacco Use   Smoking status: Never Smoker   Smokeless tobacco: Never Used  Substance and Sexual Activity   Alcohol use: Yes    Alcohol/week: 10.0 standard drinks    Types: 10 Cans of beer per week    Comment:     Drug use: No   Sexual activity: Never  Lifestyle   Physical activity    Days per week: Not on file    Minutes per session: Not on file   Stress: Not on file  Relationships   Social Herbalist on phone: Not on file    Gets together: Not on file    Attends religious service: Not on file    Active member of club or organization: Not on file    Attends meetings of clubs or organizations: Not on file    Relationship status: Not on file   Intimate partner violence    Fear of current or ex partner: Not on file    Emotionally abused: Not on file    Physically abused: Not on file    Forced  sexual activity: Not on file  Other Topics Concern   Not on file  Social History Narrative   Not on file    FAMILY HISTORY: Family History  Problem Relation Age of Onset   Hyperlipidemia Mother    Hypertension Mother    Diabetes Mother    Colon cancer Father    Breast cancer Sister 10   Brain cancer Brother     ALLERGIES:  is allergic to neosporin [neomycin-bacitracin zn-polymyx].  MEDICATIONS:  Current Outpatient Medications  Medication Sig Dispense Refill   Ascorbic Acid (VITAMIN C) 1000 MG tablet Take 1,000 mg by mouth daily.     aspirin 81 MG tablet Take 81 mg by mouth daily.     Cholecalciferol (VITAMIN D3) 10000 UNITS capsule Take 10,000 Units by mouth daily. Reported on 12/27/2015     Cranberry (SM CRANBERRY) 300 MG tablet Take 300 mg by mouth daily.     Cyanocobalamin 5000 MCG SUBL Place 1 tablet under the tongue daily.     diphenhydrAMINE (BENADRYL) 25 MG tablet Take 25 mg by mouth every 6 (six) hours as needed.     KRILL OIL PO Take 1 capsule by mouth daily.     MAGNESIUM GLUCONATE PO Take 400 mg by mouth.     Multiple Vitamins-Minerals (MULTIVITAMIN WITH MINERALS) tablet Take 1 tablet by mouth 2 (two) times daily.     naproxen sodium (ANAPROX) 220 MG tablet Take 220 mg by mouth daily.     Red Yeast Rice Extract 600 MG CAPS Take 1 capsule (600 mg total) by mouth 2 (two) times daily at 10 AM and 5 PM. 60 capsule 3   telmisartan (MICARDIS) 20 MG tablet Take 1 tablet (20 mg total) by mouth at bedtime. 90 tablet 1   No current facility-administered medications for this visit.      PHYSICAL EXAMINATION: ECOG PERFORMANCE STATUS: 0 - Asymptomatic Vitals:   04/24/19 1303  BP: 133/83  Pulse: 77  Temp: (!) 96.9 F (36.1 C)   Filed Weights   04/24/19 1303  Weight: 181 lb (82.1 kg)    Physical Exam Constitutional:      General: She is not in acute distress. HENT:     Head: Normocephalic and atraumatic.  Eyes:     General: No scleral  icterus.    Pupils: Pupils are equal, round, and reactive to light.  Neck:     Musculoskeletal: Normal range of motion and neck supple.  Cardiovascular:     Rate and Rhythm: Normal rate and regular rhythm.     Heart sounds: Normal heart sounds.  Pulmonary:     Effort: Pulmonary effort is normal. No respiratory distress.     Breath sounds: No wheezing.  Abdominal:     General: Bowel sounds are normal. There is no distension.     Palpations: Abdomen is soft. There is no mass.     Tenderness: There is  no abdominal tenderness.  Musculoskeletal: Normal range of motion.        General: No deformity.  Skin:    General: Skin is warm and dry.     Findings: No erythema or rash.  Neurological:     Mental Status: She is alert and oriented to person, place, and time.     Cranial Nerves: No cranial nerve deficit.     Coordination: Coordination normal.  Psychiatric:        Behavior: Behavior normal.        Thought Content: Thought content normal.     LABORATORY DATA:  I have reviewed the data as listed Lab Results  Component Value Date   WBC 5.3 04/24/2019   HGB 12.0 04/24/2019   HCT 36.2 04/24/2019   MCV 86.2 04/24/2019   PLT 347 04/24/2019   Recent Labs    05/14/18 0854 11/20/18 0929 03/26/19 1409 04/06/19 1132 04/24/19 1139  NA 138 139 137 131*  --   K 3.9 4.5 4.0 4.1  --   CL 100 103 101 95*  --   CO2 33* '30 29 28  ' --   GLUCOSE 112* 110* 94 105*  --   BUN '13 18 18 12  ' --   CREATININE 0.81 0.80 1.02 0.70  --   CALCIUM 10.1 9.9 9.4 9.4  --   PROT 7.8 7.0  --  6.7 7.9  ALBUMIN 4.6 4.4  --   --  4.5  AST 17 14  --   --  16  ALT 15 13  --   --  13  ALKPHOS 94 86  --   --  92  BILITOT 0.6 0.4  --   --  0.5  BILIDIR  --   --   --   --  <0.1  IBILI  --   --   --   --  NOT CALCULATED   Iron/TIBC/Ferritin/ %Sat No results found for: IRON, TIBC, FERRITIN, IRONPCTSAT    RADIOGRAPHIC STUDIES: I have personally reviewed the radiological images as listed and agreed with the  findings in the report.  Mr Cervical Spine Wo Contrast  Addendum Date: 04/03/2019   ADDENDUM REPORT: 04/03/2019 10:39 ADDENDUM: 1.3 cm lesion in the T2 vertebral body. If the patient has a history of malignancy, this would be concerning for a metastasis and nuclear medicine whole-body bone scan may be useful for further evaluation. If the patient does not have a known or suspected malignancy, postcontrast spine MRI and laboratory evaluation for multiple myeloma may be considered. Electronically Signed   By: Logan Bores M.D.   On: 04/03/2019 10:39   Result Date: 04/03/2019 CLINICAL DATA:  Cervical radiculopathy. Left hand numbness. Right leg pain. EXAM: MRI CERVICAL SPINE WITHOUT CONTRAST TECHNIQUE: Multiplanar, multisequence MR imaging of the cervical spine was performed. No intravenous contrast was administered. COMPARISON:  Cervical spine radiographs 05/06/2017 FINDINGS: Alignment: Slight reversal of the normal cervical lordosis. Minimal retrolisthesis of C3 on C4. Vertebrae: No fracture. 1.3 cm round T1 and T2 hypointense lesion in the left aspect of the T2 vertebral body. Primarily chronic degenerative endplate changes from W1-1 to C6-7 with associated moderate to severe disc space narrowing at these levels. Cord: Normal signal. Posterior Fossa, vertebral arteries, paraspinal tissues: Unremarkable. Disc levels: C2-3: Mild uncovertebral spurring and mild-to-moderate facet arthrosis result in mild left neural foraminal stenosis without spinal stenosis. C3-4: Broad-based posterior disc osteophyte complex and moderate to severe facet arthrosis result in moderate spinal stenosis with mild cord  flattening and mild right and moderate to severe left neural foraminal stenosis. C4-5: Broad-based posterior disc osteophyte complex and severe right and moderate left facet arthrosis result in borderline to mild spinal stenosis and moderate right greater than left neural foraminal stenosis. C5-6: Broad-based posterior  disc osteophyte complex and mild-to-moderate facet arthrosis result in mild spinal stenosis and moderate bilateral neural foraminal stenosis. C6-7: Broad-based posterior disc osteophyte complex and mild facet arthrosis result in mild-to-moderate spinal stenosis and mild right and severe left neural foraminal stenosis with likely left C7 nerve root impingement. C7-T1: Mild facet arthrosis without disc herniation or stenosis. T1-2: Moderate to severe facet arthrosis without disc herniation or significant stenosis. IMPRESSION: 1. Advanced cervical disc and facet degeneration with mild-to-moderate multilevel spinal stenosis. 2. Multilevel neural foraminal stenosis, severe on the left at C6-7 likely resulting in C7 nerve root impingement. Electronically Signed: By: Logan Bores M.D. On: 04/02/2019 21:34   Mr Thoracic Spine W Wo Contrast  Result Date: 04/17/2019 CLINICAL DATA:  T2 vertebral body lesion. EXAM: MRI THORACIC WITHOUT AND WITH CONTRAST TECHNIQUE: Multiplanar and multiecho pulse sequences of the thoracic spine were obtained without and with intravenous contrast. CONTRAST:  8 mL Gadavist intravenous contrast COMPARISON:  Cervical spine MRI 04/02/2019 FINDINGS: MRI THORACIC SPINE FINDINGS Alignment:  Physiologic. Vertebrae: Round T1/T2 hypointense lesion within the T2 vertebral body on the left measuring 1.3 cm. Mild homogeneous enhancement on postcontrast images. No vertebral body expansion. No involvement of the posterior elements. No pathologic fracture. No soft extraosseous soft tissue component. There are no additional suspicious osseous lesions. Cord:  Normal signal and morphology. Paraspinal and other soft tissues: Negative. Disc levels: T7-T8: Mild left paracentral disc bulge. No foraminal or canal stenosis. T10-T11: Diffuse disc bulge and mild bilateral facet arthropathy results in mild right foraminal stenosis and mild canal stenosis. Remaining thoracic intervertebral disc levels are within normal  limits. Degenerative disc disease at C6-C7, better characterized on dedicated cervical spine MRI 04/02/2019. IMPRESSION: 1. Nonspecific 1.3 cm T1/T2 hypointense lesion within the T2 vertebral body, which demonstrates mild enhancement on postcontrast sequences. There are no other suspicious imaging features or additional lesions within the thoracic spine. 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 A metastatic lesion and myeloma remain in the differential. Correlation with nuclear medicine bone scan should be considered to identify any potential additional lesions. A CT could also be considered to look for imaging features more indicative of hemangioma. 2. Mild thoracic spondylosis with mild right foraminal and mild canal stenosis at the T10-11 level. Electronically Signed   By: Davina Poke M.D.   On: 04/17/2019 10:29   Dg Bone Survey Met  Result Date: 04/24/2019 CLINICAL DATA:  MGUS evaluation.  T2 lesion identified on recent MR. EXAM: METASTATIC BONE SURVEY COMPARISON:  04/16/2019 MR and prior studies FINDINGS: No suspicious focal bony lesions are identified. The T2 lesion identified on recent MR is not well visualized radiographically. Moderate to severe degenerative changes in both hips and moderate multilevel degenerative changes in the lumbar spine noted. IMPRESSION: 1. No suspicious focal bony lesions identified. The known T2 lesion identified on recent MR is not well visualized radiographically. Electronically Signed   By: Margarette Canada M.D.   On: 04/24/2019 17:01      ASSESSMENT & PLAN:  1. Bone lesion   2. Anemia, unspecified type    Images were independently reviewed and discussed with patient.  Possible Hemangioma, UPEP and SPEP multiple myeloma.  Recommend obtain bone scan,  as well skeletal survey.  Check free light chain ratio, beta microglobulin,   Mild anemia, repeat cbc, with differential, retic count. LFT,  She is up to date  for mammogram screening and colonoscopy.   Orders Placed This Encounter  Procedures   NM Bone Scan Whole Body    Standing Status:   Future    Standing Expiration Date:   04/23/2020    Order Specific Question:   If indicated for the ordered procedure, I authorize the administration of a radiopharmaceutical per Radiology protocol    Answer:   Yes    Order Specific Question:   Preferred imaging location?    Answer:   [facility]    Order Specific Question:   Radiology Contrast Protocol - do NOT remove file path    Answer:   \[email or web address]   DG Bone Survey Met    Standing Status:   Future    Number of Occurrences:   1    Standing Expiration Date:   06/23/2020    Order Specific Question:   Reason for Exam (SYMPTOM  OR DIAGNOSIS REQUIRED)    Answer:   MGUS    Order Specific Question:   Preferred imaging location?    Answer[facility]    Order Specific Question:   Radiology Contrast Protocol - do NOT remove file path    Answer:   \[email or web address]   Kappa/lambda light chains    Standing Status:   Future    Number of Occurrences:   1    Standing Expiration Date:   04/23/2020   Beta 2 microglobulin, serum    Standing Status:   Future    Number of Occurrences:   1    Standing Expiration Date:   04/23/2020   Hepatic function panel    Standing Status:   Future    Number of Occurrences:   1    Standing Expiration Date:   04/23/2020   CBC with Differential/Platelet    Standing Status:   Future    Number of Occurrences:   1    Standing Expiration Date:   04/23/2020   Retic Panel    Standing Status:   Future    Number of Occurrences:   1    Standing Expiration Date:   04/23/2020    All questions were answered. The patient knows to call the clinic with any problems questions or concerns.  cc Crecencio Mc, MD    Return of visit: 1 month Thank you for this kind referral and the opportunity to  participate in the care of this patient. A copy of today's note is routed to referring provider  Total face to face encounter time for this patient visit was 45 min. >50% of the time was  spent in counseling and coordination of care.    Earlie Server, MD, PhD Hematology Oncology [facility] Pager- [phone] 04/24/2019

## 2019-04-25 LAB — BETA 2 MICROGLOBULIN, SERUM: Beta-2 Microglobulin: 1.3 mg/L (ref 0.6–2.4)

## 2019-04-27 LAB — KAPPA/LAMBDA LIGHT CHAINS
Kappa free light chain: 12.3 mg/L (ref 3.3–19.4)
Kappa, lambda light chain ratio: 0.84 (ref 0.26–1.65)
Lambda free light chains: 14.7 mg/L (ref 5.7–26.3)

## 2019-04-29 ENCOUNTER — Encounter: Payer: Self-pay | Admitting: Internal Medicine

## 2019-04-30 ENCOUNTER — Other Ambulatory Visit: Payer: Self-pay

## 2019-04-30 ENCOUNTER — Encounter
Admission: RE | Admit: 2019-04-30 | Discharge: 2019-04-30 | Disposition: A | Discharge status: Home / Self Care | Payer: BLUE CROSS/BLUE SHIELD | Source: Ambulatory Visit | Attending: Oncology | Admitting: Oncology

## 2019-04-30 ENCOUNTER — Ambulatory Visit
Admission: RE | Admit: 2019-04-30 | Discharge: 2019-04-30 | Disposition: A | Discharge status: Home / Self Care | Payer: BLUE CROSS/BLUE SHIELD | Source: Ambulatory Visit | Attending: Oncology | Admitting: Oncology

## 2019-04-30 DIAGNOSIS — M899 Disorder of bone, unspecified: Secondary | ICD-10-CM | POA: Insufficient documentation

## 2019-04-30 MED ORDER — TECHNETIUM TC 99M MEDRONATE IV KIT
23.8380 | PACK | Freq: Once | INTRAVENOUS | Status: AC | PRN
  Administered 2019-04-30: 23.838 via INTRAVENOUS

## 2019-05-05 ENCOUNTER — Telehealth: Payer: Self-pay

## 2019-05-05 ENCOUNTER — Other Ambulatory Visit: Payer: Self-pay | Admitting: Oncology

## 2019-05-05 DIAGNOSIS — M899 Disorder of bone, unspecified: Secondary | ICD-10-CM

## 2019-05-05 NOTE — Telephone Encounter (Signed)
-----   Message from Earlie Server, MD sent at 05/05/2019  8:36 AM EDT ----- Please let her know that there are additional bone lesions detected on bone scan and I recommend CT chest abdomen pelvis.  Please arrange her to get CT done and have MD visit after CT thanks.

## 2019-05-05 NOTE — Telephone Encounter (Signed)
Message left to call for results and scheduling pool message sent.

## 2019-05-06 NOTE — Telephone Encounter (Signed)
Patient informed. 

## 2019-05-15 ENCOUNTER — Ambulatory Visit
Admission: RE | Admit: 2019-05-15 | Discharge: 2019-05-15 | Disposition: A | Discharge status: Home / Self Care | Payer: BLUE CROSS/BLUE SHIELD | Source: Ambulatory Visit | Attending: Oncology | Admitting: Oncology

## 2019-05-15 ENCOUNTER — Other Ambulatory Visit: Payer: Self-pay

## 2019-05-15 DIAGNOSIS — M899 Disorder of bone, unspecified: Secondary | ICD-10-CM | POA: Diagnosis not present

## 2019-05-15 MED ORDER — IOHEXOL 300 MG/ML  SOLN
100.0000 mL | Freq: Once | INTRAMUSCULAR | Status: AC | PRN
  Administered 2019-05-15: 100 mL via INTRAVENOUS

## 2019-05-18 ENCOUNTER — Encounter: Payer: BLUE CROSS/BLUE SHIELD | Admitting: Internal Medicine

## 2019-05-19 ENCOUNTER — Ambulatory Visit: Payer: BLUE CROSS/BLUE SHIELD | Admitting: Oncology

## 2019-05-21 ENCOUNTER — Encounter: Payer: Self-pay | Admitting: Oncology

## 2019-05-21 ENCOUNTER — Other Ambulatory Visit: Payer: Self-pay

## 2019-05-21 NOTE — Progress Notes (Signed)
Patient wanted to know what will happen next.

## 2019-05-22 ENCOUNTER — Other Ambulatory Visit: Payer: Self-pay

## 2019-05-22 ENCOUNTER — Encounter: Payer: Self-pay | Admitting: Oncology

## 2019-05-22 ENCOUNTER — Inpatient Hospital Stay: Payer: BLUE CROSS/BLUE SHIELD | Attending: Oncology | Admitting: Oncology

## 2019-05-22 VITALS — BP 135/75 | HR 79 | Temp 97.6°F | Resp 18 | Wt 179.1 lb

## 2019-05-22 DIAGNOSIS — N289 Disorder of kidney and ureter, unspecified: Secondary | ICD-10-CM | POA: Diagnosis not present

## 2019-05-22 DIAGNOSIS — M899 Disorder of bone, unspecified: Secondary | ICD-10-CM

## 2019-05-22 DIAGNOSIS — Z79899 Other long term (current) drug therapy: Secondary | ICD-10-CM | POA: Diagnosis not present

## 2019-05-22 DIAGNOSIS — Z7982 Long term (current) use of aspirin: Secondary | ICD-10-CM | POA: Diagnosis not present

## 2019-05-22 DIAGNOSIS — Z808 Family history of malignant neoplasm of other organs or systems: Secondary | ICD-10-CM | POA: Diagnosis not present

## 2019-05-22 DIAGNOSIS — Z800 Family history of malignant neoplasm of digestive organs: Secondary | ICD-10-CM | POA: Diagnosis not present

## 2019-05-22 DIAGNOSIS — D649 Anemia, unspecified: Secondary | ICD-10-CM | POA: Insufficient documentation

## 2019-05-22 DIAGNOSIS — Z803 Family history of malignant neoplasm of breast: Secondary | ICD-10-CM | POA: Diagnosis not present

## 2019-05-22 NOTE — Progress Notes (Signed)
Hematology/Oncology follow up  note [facility] Telephone:([phone]) [phone] Fax:(336) [phone]   Patient Care Team: Kathy Mc, Kathy [name] as PCP - General (Internal Medicine) Kathy Mc, Kathy [name] (Internal Medicine) Kathy Castilla [place] Gleason, Kathy [name] (General Surgery)  REFERRING PROVIDER: Crecencio Mc, Kathy [name]  CHIEF COMPLAINTS/REASON FOR VISIT:  Evaluation of bone lesion.   HISTORY OF PRESENTING ILLNESS:   Kathy [name] is a  63 y.o.  female with PMH listed below was seen in consultation at the request of  Kathy Mc, Kathy [name]  for evaluation of bone lesion.  Patients reports having bilateral hand numbness, progressively worsened and now have bilateral lower extrememity radiculopathy R>L.  04/02/2019 Cervical Spine MRI wo contrast showed 1.3cm T2 vertebral body lesion.  04/16/2019 thoracic spine w and wo contrast showed non specific 1.3cm T1/T2 hypointense lesion within T2 vertebral body. 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 Metastatic lesion and myelpma remain in the differential.  Mild thoracic spondylosis with might right foraminal and mild canal stenosis at T10-11 level.   Patient's PCP has ordered myeloma work up.  04/06/2019 SPEP showed negative M spike, immunofixation is unremarkable. Mildly decreased IgA.  24 hour urine UPEP negative M spike, IFE urine unremarkable.   INTERVAL HISTORY Kathy [name] is a 63 y.o. female who has above history reviewed by me today presents for follow up visit for management of bone lesion,  Problems and complaints are listed below: No new complaints, chronic back pain and lower extremity numbness.   Had CT chest abdomen pelvis done and also bone scan.  Presents to discuss results and further management plan.    Review of Systems  Constitutional: Negative for appetite change, chills, fatigue and fever.  HENT:   Negative for hearing loss and voice change.   Eyes: Negative for eye problems.    Respiratory: Negative for chest tightness and cough.   Cardiovascular: Negative for chest pain.  Gastrointestinal: Negative for abdominal distention, abdominal pain and blood in stool.  Endocrine: Negative for hot flashes.  Genitourinary: Negative for difficulty urinating and frequency.   Musculoskeletal: Negative for arthralgias.  Skin: Negative for itching and rash.  Neurological: Positive for numbness. Negative for extremity weakness.  Hematological: Negative for adenopathy.  Psychiatric/Behavioral: Negative for confusion.    MEDICAL HISTORY:  Past Medical History:  Diagnosis Date   Arthritis    mild   GERD (gastroesophageal reflux disease)    occasional   Wears contact lenses     SURGICAL HISTORY: Past Surgical History:  Procedure Laterality Date   COLONOSCOPY  2006   COLONOSCOPY N/A 01/03/2016   Procedure: COLONOSCOPY;  Surgeon: Hulen Luster, Kathy [name];  Location: [place];  Service: Gastroenterology;  Laterality: N/A;   fatty tissue under bilateral arm      SOCIAL HISTORY: Social History   Socioeconomic History   Marital status: Widowed    Spouse name: Not on file   Number of children: Not on file   Years of education: Not on file   Highest education level: Not on file  Occupational History   Not on file  Social Needs   Financial resource strain: Not on file   Food insecurity    Worry: Not on file    Inability: Not on file   Transportation needs    Medical: Not on file    Non-medical: Not on file  Tobacco Use   Smoking status: Never Smoker   Smokeless tobacco: Never Used  Substance and  Sexual Activity   Alcohol use: Yes    Alcohol/week: 10.0 standard drinks    Types: 10 Cans of beer per week    Comment:     Drug use: No   Sexual activity: Never  Lifestyle   Physical activity    Days per week: Not on file    Minutes per session: Not on file   Stress: Not on file  Relationships   Social connections    Talks on phone: Not on  file    Gets together: Not on file    Attends religious service: Not on file    Active member of club or organization: Not on file    Attends meetings of clubs or organizations: Not on file    Relationship status: Not on file   Intimate partner violence    Fear of current or ex partner: Not on file    Emotionally abused: Not on file    Physically abused: Not on file    Forced sexual activity: Not on file  Other Topics Concern   Not on file  Social History Narrative   Not on file    FAMILY HISTORY: Family History  Problem Relation Age of Onset   Hyperlipidemia Mother    Hypertension Mother    Diabetes Mother    Colon cancer Father    Breast cancer Sister 46   Brain cancer Brother     ALLERGIES:  is allergic to neosporin [neomycin-bacitracin zn-polymyx].  MEDICATIONS:  Current Outpatient Medications  Medication Sig Dispense Refill   Ascorbic Acid (VITAMIN C) 1000 MG tablet Take 1,000 mg by mouth daily.     aspirin 81 MG tablet Take 81 mg by mouth daily.     Cranberry (SM CRANBERRY) 300 MG tablet Take 300 mg by mouth daily.     Cyanocobalamin 5000 MCG SUBL Place 1 tablet under the tongue daily.     diphenhydrAMINE (BENADRYL) 25 MG tablet Take 25 mg by mouth every 6 (six) hours as needed.     KRILL OIL PO Take 1 capsule by mouth daily.     MAGNESIUM GLUCONATE PO Take 400 mg by mouth.     Multiple Vitamins-Minerals (MULTIVITAMIN WITH MINERALS) tablet Take 1 tablet by mouth 2 (two) times daily.     naproxen sodium (ANAPROX) 220 MG tablet Take 220 mg by mouth daily.     Red Yeast Rice Extract 600 MG CAPS Take 1 capsule (600 mg total) by mouth 2 (two) times daily at 10 AM and 5 PM. 60 capsule 3   telmisartan (MICARDIS) 20 MG tablet Take 1 tablet (20 mg total) by mouth at bedtime. 90 tablet 1   Cholecalciferol (VITAMIN D3) 10000 UNITS capsule Take 10,000 Units by mouth daily. Reported on 12/27/2015     No current facility-administered medications for this  visit.      PHYSICAL EXAMINATION: ECOG PERFORMANCE STATUS: 0 - Asymptomatic Vitals:   05/21/19 1524  BP: 135/75  Pulse: 79  Resp: 18  Temp: 97.6 F (36.4 C)   Filed Weights   05/21/19 1524  Weight: 179 lb 1.6 oz (81.2 kg)    Physical Exam Constitutional:      General: She is not in acute distress. HENT:     Head: Normocephalic and atraumatic.  Eyes:     General: No scleral icterus.    Pupils: Pupils are equal, round, and reactive to light.  Neck:     Musculoskeletal: Normal range of motion and neck supple.  Cardiovascular:  Rate and Rhythm: Normal rate and regular rhythm.     Heart sounds: Normal heart sounds.  Pulmonary:     Effort: Pulmonary effort is normal. No respiratory distress.     Breath sounds: No wheezing.  Abdominal:     General: Bowel sounds are normal. There is no distension.     Palpations: Abdomen is soft. There is no mass.     Tenderness: There is no abdominal tenderness.  Musculoskeletal: Normal range of motion.        General: No deformity.  Skin:    General: Skin is warm and dry.     Findings: No erythema or rash.  Neurological:     Mental Status: She is alert and oriented to person, place, and time.     Cranial Nerves: No cranial nerve deficit.     Coordination: Coordination normal.  Psychiatric:        Behavior: Behavior normal.        Thought Content: Thought content normal.     LABORATORY DATA:  I have reviewed the data as listed Lab Results  Component Value Date   WBC 5.3 04/24/2019   HGB 12.0 04/24/2019   HCT 36.2 04/24/2019   MCV 86.2 04/24/2019   PLT 347 04/24/2019   Recent Labs    11/20/18 0929 03/26/19 1409 04/06/19 1132 04/24/19 1139  NA 139 137 131*  --   K 4.5 4.0 4.1  --   CL 103 101 95*  --   CO2 '30 29 28  ' --   GLUCOSE 110* 94 105*  --   BUN '18 18 12  ' --   CREATININE 0.80 1.02 0.70  --   CALCIUM 9.9 9.4 9.4  --   PROT 7.0  --  6.7 7.9  ALBUMIN 4.4  --   --  4.5  AST 14  --   --  16  ALT 13  --   --   13  ALKPHOS 86  --   --  92  BILITOT 0.4  --   --  0.5  BILIDIR  --   --   --  <0.1  IBILI  --   --   --  NOT CALCULATED   Iron/TIBC/Ferritin/ %Sat No results found for: IRON, TIBC, FERRITIN, IRONPCTSAT    RADIOGRAPHIC STUDIES: I have personally reviewed the radiological images as listed and agreed with the findings in the report.  Ct Chest W Contrast  Result Date: 05/15/2019 CLINICAL DATA:  Patient with intermittent leg pain since 10/2018. Evaluate for metastatic disease given lesion identified on MRI thoracic spine and bone scan. EXAM: CT CHEST, ABDOMEN, AND PELVIS WITH CONTRAST TECHNIQUE: Multidetector CT imaging of the chest, abdomen and pelvis was performed following the standard protocol during bolus administration of intravenous contrast. CONTRAST:  148m OMNIPAQUE IOHEXOL 300 MG/ML  SOLN COMPARISON:  MRI thoracic spine 04/16/2019; bone scan 04/30/2019 FINDINGS: CT CHEST FINDINGS Cardiovascular: Normal heart size. Trace fluid superior pericardial recess. Normal caliber aorta and main pulmonary artery. Mediastinum/Nodes: No enlarged axillary, mediastinal or hilar lymphadenopathy. Small hiatal hernia. Lungs/Pleura: Central airways are patent. Dependent atelectasis within the bilateral lower lobes. No large area pulmonary consolidation. No pleural effusion or pneumothorax. Musculoskeletal: No aggressive or acute appearing osseous lesions. CT ABDOMEN PELVIS FINDINGS Hepatobiliary: Liver is normal in size and contour. No focal hepatic lesion is identified. Gallbladder is unremarkable. No intrahepatic or extrahepatic biliary ductal dilatation. Pancreas: Unremarkable Spleen: Calcified granulomas in the spleen. Adrenals/Urinary Tract: Normal adrenal glands. Kidneys enhance symmetrically with  contrast. No hydronephrosis. Urinary bladder is unremarkable. Within the interpolar region of the left kidney there is a 2.3 cm exophytic cystic structure with internal density of 21 Hounsfield units (image 71;  series 2). Stomach/Bowel: No abnormal bowel wall thickening or evidence for bowel obstruction. No free fluid or free intraperitoneal air. Small hiatal hernia. Normal appendix. Vascular/Lymphatic: Normal caliber abdominal aorta. Duplicated IVC. No retroperitoneal lymphadenopathy. Reproductive: Uterus and adnexal structures unremarkable. Other: None. Musculoskeletal: Bilateral hip joint degenerative changes. Lumbar spine degenerative changes. No aggressive or acute appearing osseous lesions. IMPRESSION: No CT evidence to suggest soft tissue metastatic disease within the chest, abdomen or pelvis. Exophytic cystic structure within the midpole of the left kidney with internal density above fluid. Consider definitive characterization with pre and post contrast-enhanced CT or MRI. Consider continued clinical workup for the thoracic spine lesion and MRI imaging follow-up. Electronically Signed   By: Lovey Newcomer M.D.   On: 05/15/2019 09:21   Nm Bone Scan Whole Body  Result Date: 04/30/2019 CLINICAL DATA:  Back pain, BILATERAL upper leg pain worse on RIGHT, abnormal MRI thoracic spine with T2 lesion, no history of primary tumor, history hypertension, diabetes mellitus EXAM: NUCLEAR MEDICINE WHOLE BODY BONE SCAN TECHNIQUE: Whole body anterior and posterior images were obtained approximately 3 hours after intravenous injection of radiopharmaceutical. RADIOPHARMACEUTICALS:  23.838 mCi Technetium-20mMDP IV COMPARISON:  None Correlation: Thoracic spine MR 04/16/2019 FINDINGS: Increased uptake at the [facility], sternoclavicular joints, BILATERAL hips, typically degenerative. Minimal uptake at RIGHT lateral aspect of cervical spine, typically degenerative. Subtle focus of increased tracer accumulation is seen at the LEFT lateral aspect of the upper cervical spine at approximately T2, corresponding to MR finding. Abnormal tracer accumulation at L4 vertebral body on LEFT concerning for metastatic disease. No additional worrisome  sites of abnormal osseous tracer accumulation are identified. Expected urinary tract and soft tissue distribution of tracer. IMPRESSION: Subtle focus of abnormal increased tracer accumulation at the LEFT lateral aspect of the T2 vertebral body, corresponding with MR finding, question metastatic disease. Additionally, abnormal increased tracer accumulation at the LEFT lateral aspect of the L2 vertebral body, cannot exclude osseous metastasis; MR lumbar spine recommended for further assessment. Additional scattered degenerative type uptake at multiple joints. Electronically Signed   By: MLavonia DanaM.D.   On: 04/30/2019 17:49   Ct Abdomen Pelvis W Contrast  Result Date: 05/15/2019 CLINICAL DATA:  Patient with intermittent leg pain since 10/2018. Evaluate for metastatic disease given lesion identified on MRI thoracic spine and bone scan. EXAM: CT CHEST, ABDOMEN, AND PELVIS WITH CONTRAST TECHNIQUE: Multidetector CT imaging of the chest, abdomen and pelvis was performed following the standard protocol during bolus administration of intravenous contrast. CONTRAST:  1057mOMNIPAQUE IOHEXOL 300 MG/ML  SOLN COMPARISON:  MRI thoracic spine 04/16/2019; bone scan 04/30/2019 FINDINGS: CT CHEST FINDINGS Cardiovascular: Normal heart size. Trace fluid superior pericardial recess. Normal caliber aorta and main pulmonary artery. Mediastinum/Nodes: No enlarged axillary, mediastinal or hilar lymphadenopathy. Small hiatal hernia. Lungs/Pleura: Central airways are patent. Dependent atelectasis within the bilateral lower lobes. No large area pulmonary consolidation. No pleural effusion or pneumothorax. Musculoskeletal: No aggressive or acute appearing osseous lesions. CT ABDOMEN PELVIS FINDINGS Hepatobiliary: Liver is normal in size and contour. No focal hepatic lesion is identified. Gallbladder is unremarkable. No intrahepatic or extrahepatic biliary ductal dilatation. Pancreas: Unremarkable Spleen: Calcified granulomas in the  spleen. Adrenals/Urinary Tract: Normal adrenal glands. Kidneys enhance symmetrically with contrast. No hydronephrosis. Urinary bladder is unremarkable. Within the interpolar region of the left  kidney there is a 2.3 cm exophytic cystic structure with internal density of 21 Hounsfield units (image 71; series 2). Stomach/Bowel: No abnormal bowel wall thickening or evidence for bowel obstruction. No free fluid or free intraperitoneal air. Small hiatal hernia. Normal appendix. Vascular/Lymphatic: Normal caliber abdominal aorta. Duplicated IVC. No retroperitoneal lymphadenopathy. Reproductive: Uterus and adnexal structures unremarkable. Other: None. Musculoskeletal: Bilateral hip joint degenerative changes. Lumbar spine degenerative changes. No aggressive or acute appearing osseous lesions. IMPRESSION: No CT evidence to suggest soft tissue metastatic disease within the chest, abdomen or pelvis. Exophytic cystic structure within the midpole of the left kidney with internal density above fluid. Consider definitive characterization with pre and post contrast-enhanced CT or MRI. Consider continued clinical workup for the thoracic spine lesion and MRI imaging follow-up. Electronically Signed   By: Lovey Newcomer M.D.   On: 05/15/2019 09:21   Dg Bone Survey Met  Result Date: 04/24/2019 CLINICAL DATA:  MGUS evaluation.  T2 lesion identified on recent MR. EXAM: METASTATIC BONE SURVEY COMPARISON:  04/16/2019 MR and prior studies FINDINGS: No suspicious focal bony lesions are identified. The T2 lesion identified on recent MR is not well visualized radiographically. Moderate to severe degenerative changes in both hips and moderate multilevel degenerative changes in the lumbar spine noted. IMPRESSION: 1. No suspicious focal bony lesions identified. The known T2 lesion identified on recent MR is not well visualized radiographically. Electronically Signed   By: Margarette Canada M.D.   On: 04/24/2019 17:01      ASSESSMENT & PLAN:  1.  Bone lesion   2. Kidney lesion   3. Anemia, unspecified type    # lab results and images were independently reviewed and discussed with patient.  04/06/2019 SPEP showed negative M spike, immunofixation is unremarkable. Mildly decreased IgA.  24 hour urine UPEP negative M spike, IFE urine unremarkable  Bone Scan showed subtle focus of abnormal increased tracer at the lest lateral T2 vertebral body.  Additionally abnormal increased tracer at the left lateral aspect of L2 vertebral body. Need further evaluation.   CT chest abdomen pelvis showed no evidence of soft tissue metastatic disease. Exophytic cystic structure within mid-pole of left kidney.   Will obtain MRI abdomen and MRI lumbar spine for further evaluation.  Observation vs CT guided bone lesion can be further considered.patient agrees with the plan.   She is up to date for mammogram screening and colonoscopy.   Orders Placed This Encounter  Procedures   MR Lumbar Spine W Wo Contrast    Standing Status:   Future    Standing Expiration Date:   05/21/2020    Order Specific Question:   ** REASON FOR EXAM (FREE TEXT)    Answer:   L4 "active" on bone scan    Order Specific Question:   If indicated for the ordered procedure, I authorize the administration of contrast media per Radiology protocol    Answer:   Yes    Order Specific Question:   What is the patient's sedation requirement?    Answer:   No Sedation    Order Specific Question:   Does the patient have a pacemaker or implanted devices?    Answer:   No    Order Specific Question:   Use SRS Protocol?    Answer:   Yes    Order Specific Question:   Radiology Contrast Protocol - do NOT remove file path    Answer:   \[email or web address]    Order Specific Question:   Preferred imaging location?  Answer:   [facility] (table limit-400lbs)   MR Abdomen W Wo Contrast    Standing Status:   Future    Standing Expiration Date:   05/21/2020    Order  Specific Question:   ** REASON FOR EXAM (FREE TEXT)    Answer:   kidney lesion    Order Specific Question:   If indicated for the ordered procedure, I authorize the administration of contrast media per Radiology protocol    Answer:   Yes    Order Specific Question:   What is the patient's sedation requirement?    Answer:   No Sedation    Order Specific Question:   Does the patient have a pacemaker or implanted devices?    Answer:   No    Order Specific Question:   Radiology Contrast Protocol - do NOT remove file path    Answer:   [email or web address]    Order Specific Question:   Preferred imaging location?    Answer:   [facility] (table limit-400lbs)    All questions were answered. The patient knows to call the clinic with any problems questions or concerns.  cc Kathy Mc, Kathy [name]    Follow up after MRI.  We spent sufficient time to discuss many aspect of care, questions were answered to patient's satisfaction. Total face to face encounter time for this patient visit was 25 min. >50% of the time was  spent in counseling and coordination of care.   Earlie Server, MD, PhD Hematology Oncology [facility] at [facility] Pager- [phone] 05/22/2019

## 2019-06-03 ENCOUNTER — Other Ambulatory Visit: Payer: Self-pay

## 2019-06-03 ENCOUNTER — Ambulatory Visit
Admission: RE | Admit: 2019-06-03 | Discharge: 2019-06-03 | Disposition: A | Discharge status: Home / Self Care | Payer: BLUE CROSS/BLUE SHIELD | Source: Ambulatory Visit | Attending: Oncology | Admitting: Oncology

## 2019-06-03 DIAGNOSIS — M899 Disorder of bone, unspecified: Secondary | ICD-10-CM | POA: Insufficient documentation

## 2019-06-03 MED ORDER — GADOBUTROL 1 MMOL/ML IV SOLN
8.0000 mL | Freq: Once | INTRAVENOUS | Status: AC | PRN
  Administered 2019-06-03: 8 mL via INTRAVENOUS

## 2019-06-05 NOTE — Progress Notes (Signed)
Dear Kathy [name],  Your MRI abdomen result showed a left kidney cyst. No need for additional work up.  Thank you Have a nice weekend.   Dr.Selwyn Reason

## 2019-06-18 ENCOUNTER — Ambulatory Visit (INDEPENDENT_AMBULATORY_CARE_PROVIDER_SITE_OTHER): Payer: BLUE CROSS/BLUE SHIELD

## 2019-06-18 ENCOUNTER — Ambulatory Visit (INDEPENDENT_AMBULATORY_CARE_PROVIDER_SITE_OTHER): Payer: BLUE CROSS/BLUE SHIELD | Admitting: Internal Medicine

## 2019-06-18 ENCOUNTER — Encounter: Payer: Self-pay | Admitting: Internal Medicine

## 2019-06-18 ENCOUNTER — Other Ambulatory Visit: Payer: Self-pay

## 2019-06-18 VITALS — BP 128/70 | HR 71 | Temp 97.4°F | Resp 15 | Ht 66.0 in | Wt 177.8 lb

## 2019-06-18 DIAGNOSIS — M48061 Spinal stenosis, lumbar region without neurogenic claudication: Secondary | ICD-10-CM

## 2019-06-18 DIAGNOSIS — Z1231 Encounter for screening mammogram for malignant neoplasm of breast: Secondary | ICD-10-CM

## 2019-06-18 DIAGNOSIS — E785 Hyperlipidemia, unspecified: Secondary | ICD-10-CM | POA: Diagnosis not present

## 2019-06-18 DIAGNOSIS — M25551 Pain in right hip: Secondary | ICD-10-CM

## 2019-06-18 DIAGNOSIS — Z0000 Encounter for general adult medical examination without abnormal findings: Secondary | ICD-10-CM

## 2019-06-18 DIAGNOSIS — E1169 Type 2 diabetes mellitus with other specified complication: Secondary | ICD-10-CM

## 2019-06-18 DIAGNOSIS — E119 Type 2 diabetes mellitus without complications: Secondary | ICD-10-CM | POA: Diagnosis not present

## 2019-06-18 DIAGNOSIS — G8929 Other chronic pain: Secondary | ICD-10-CM

## 2019-06-18 DIAGNOSIS — R5383 Other fatigue: Secondary | ICD-10-CM

## 2019-06-18 DIAGNOSIS — Z96641 Presence of right artificial hip joint: Secondary | ICD-10-CM | POA: Insufficient documentation

## 2019-06-18 DIAGNOSIS — R200 Anesthesia of skin: Secondary | ICD-10-CM

## 2019-06-18 DIAGNOSIS — I10 Essential (primary) hypertension: Secondary | ICD-10-CM | POA: Diagnosis not present

## 2019-06-18 LAB — CBC WITH DIFFERENTIAL/PLATELET
Basophils Absolute: 0 10*3/uL (ref 0.0–0.1)
Basophils Relative: 0.5 % (ref 0.0–3.0)
Eosinophils Absolute: 0.1 10*3/uL (ref 0.0–0.7)
Eosinophils Relative: 2.8 % (ref 0.0–5.0)
HCT: 36 % (ref 36.0–46.0)
Hemoglobin: 11.9 g/dL — ABNORMAL LOW (ref 12.0–15.0)
Lymphocytes Relative: 21.1 % (ref 12.0–46.0)
Lymphs Abs: 1.1 10*3/uL (ref 0.7–4.0)
MCHC: 33.1 g/dL (ref 30.0–36.0)
MCV: 89 fl (ref 78.0–100.0)
Monocytes Absolute: 0.3 10*3/uL (ref 0.1–1.0)
Monocytes Relative: 6 % (ref 3.0–12.0)
Neutro Abs: 3.6 10*3/uL (ref 1.4–7.7)
Neutrophils Relative %: 69.6 % (ref 43.0–77.0)
Platelets: 328 10*3/uL (ref 150.0–400.0)
RBC: 4.04 Mil/uL (ref 3.87–5.11)
RDW: 13.9 % (ref 11.5–15.5)
WBC: 5.2 10*3/uL (ref 4.0–10.5)

## 2019-06-18 LAB — COMPREHENSIVE METABOLIC PANEL
ALT: 10 U/L (ref 0–35)
AST: 12 U/L (ref 0–37)
Albumin: 4.4 g/dL (ref 3.5–5.2)
Alkaline Phosphatase: 94 U/L (ref 39–117)
BUN: 18 mg/dL (ref 6–23)
CO2: 28 mEq/L (ref 19–32)
Calcium: 9.6 mg/dL (ref 8.4–10.5)
Chloride: 103 mEq/L (ref 96–112)
Creatinine, Ser: 0.76 mg/dL (ref 0.40–1.20)
GFR: 76.5 mL/min (ref >60.00)
Glucose, Bld: 105 mg/dL — ABNORMAL HIGH (ref 70–99)
Potassium: 4.5 mEq/L (ref 3.5–5.1)
Sodium: 139 mEq/L (ref 135–145)
Total Bilirubin: 0.4 mg/dL (ref 0.2–1.2)
Total Protein: 6.8 g/dL (ref 6.0–8.3)

## 2019-06-18 LAB — LIPID PANEL
Cholesterol: 222 mg/dL — ABNORMAL HIGH (ref 0–200)
HDL: 85.1 mg/dL (ref >39.00)
LDL Cholesterol: 126 mg/dL — ABNORMAL HIGH (ref 0–99)
NonHDL: 136.47
Total CHOL/HDL Ratio: 3
Triglycerides: 50 mg/dL (ref 0.0–149.0)
VLDL: 10 mg/dL (ref 0.0–40.0)

## 2019-06-18 LAB — HEMOGLOBIN A1C: Hgb A1c MFr Bld: 6.3 % (ref 4.6–6.5)

## 2019-06-18 LAB — TSH: TSH: 1.3 u[IU]/mL (ref 0.35–4.50)

## 2019-06-18 NOTE — Assessment & Plan Note (Signed)
She has gained weight but her a1c has remained low  .  I have  encouraged  Continued weight loss with goal of 10% of body weigh over the next 6 months using a low glycemic index diet and regular exercise a minimum of 5 days per week.    Lab Results  Component Value Date   HGBA1C 6.3 06/18/2019   Lab Results  Component Value Date   MICROALBUR <0.7 11/20/2018

## 2019-06-18 NOTE — Patient Instructions (Addendum)
Referral to [facility] is underway   You can add up to 2000 mg of acetominophen (tylenol) every day safely  In divided doses (500 mg every 6 hours  Or 1000 mg every 12 hours.)   Health Maintenance for Postmenopausal Women Menopause is a normal process in which your ability to get pregnant comes to an end. This process happens slowly over many months or years, usually between the ages of 19 and 86. Menopause is complete when you have missed your menstrual periods for 12 months. It is important to talk with your health care provider about some of the most common conditions that affect women after menopause (postmenopausal women). These include heart disease, cancer, and bone loss (osteoporosis). Adopting a healthy lifestyle and getting preventive care can help to promote your health and wellness. The actions you take can also lower your chances of developing some of these common conditions. What should I know about menopause? During menopause, you may get a number of symptoms, such as:  Hot flashes. These can be moderate or severe.  Night sweats.  Decrease in sex drive.  Mood swings.  Headaches.  Tiredness.  Irritability.  Memory problems.  Insomnia. Choosing to treat or not to treat these symptoms is a decision that you make with your health care provider. Do I need hormone replacement therapy?  Hormone replacement therapy is effective in treating symptoms that are caused by menopause, such as hot flashes and night sweats.  Hormone replacement carries certain risks, especially as you become older. If you are thinking about using estrogen or estrogen with progestin, discuss the benefits and risks with your health care provider. What is my risk for heart disease and stroke? The risk of heart disease, heart attack, and stroke increases as you age. One of the causes may be a change in the body's hormones during menopause. This can affect how your body uses dietary fats, triglycerides, and  cholesterol. Heart attack and stroke are medical emergencies. There are many things that you can do to help prevent heart disease and stroke. Watch your blood pressure  High blood pressure causes heart disease and increases the risk of stroke. This is more likely to develop in people who have high blood pressure readings, are of African descent, or are overweight.  Have your blood pressure checked: ? Every 3-5 years if you are 36-68 years of age. ? Every year if you are 33 years old or older. Eat a healthy diet   Eat a diet that includes plenty of vegetables, fruits, low-fat dairy products, and lean protein.  Do not eat a lot of foods that are high in solid fats, added sugars, or sodium. Get regular exercise Get regular exercise. This is one of the most important things you can do for your health. Most adults should:  Try to exercise for at least 150 minutes each week. The exercise should increase your heart rate and make you sweat (moderate-intensity exercise).  Try to do strengthening exercises at least twice each week. Do these in addition to the moderate-intensity exercise.  Spend less time sitting. Even light physical activity can be beneficial. Other tips  Work with your health care provider to achieve or maintain a healthy weight.  Do not use any products that contain nicotine or tobacco, such as cigarettes, e-cigarettes, and chewing tobacco. If you need help quitting, ask your health care provider.  Know your numbers. Ask your health care provider to check your cholesterol and your blood sugar (glucose). Continue to have  your blood tested as directed by your health care provider. Do I need screening for cancer? Depending on your health history and family history, you may need to have cancer screening at different stages of your life. This may include screening for:  Breast cancer.  Cervical cancer.  Lung cancer.  Colorectal cancer. What is my risk for osteoporosis?  After menopause, you may be at increased risk for osteoporosis. Osteoporosis is a condition in which bone destruction happens more quickly than new bone creation. To help prevent osteoporosis or the bone fractures that can happen because of osteoporosis, you may take the following actions:  If you are 77-12 years old, get at least 1,000 mg of calcium and at least 600 mg of vitamin D per day.  If you are older than age 79 but younger than age 66, get at least 1,200 mg of calcium and at least 600 mg of vitamin D per day.  If you are older than age 35, get at least 1,200 mg of calcium and at least 800 mg of vitamin D per day. Smoking and drinking excessive alcohol increase the risk of osteoporosis. Eat foods that are rich in calcium and vitamin D, and do weight-bearing exercises several times each week as directed by your health care provider. How does menopause affect my mental health? Depression may occur at any age, but it is more common as you become older. Common symptoms of depression include:  Low or sad mood.  Changes in sleep patterns.  Changes in appetite or eating patterns.  Feeling an overall lack of motivation or enjoyment of activities that you previously enjoyed.  Frequent crying spells. Talk with your health care provider if you think that you are experiencing depression. General instructions See your health care provider for regular wellness exams and vaccines. This may include:  Scheduling regular health, dental, and eye exams.  Getting and maintaining your vaccines. These include: ? Influenza vaccine. Get this vaccine each year before the flu season begins. ? Pneumonia vaccine. ? Shingles vaccine. ? Tetanus, diphtheria, and pertussis (Tdap) booster vaccine. Your health care provider may also recommend other immunizations. Tell your health care provider if you have ever been abused or do not feel safe at home. Summary  Menopause is a normal process in which your  ability to get pregnant comes to an end.  This condition causes hot flashes, night sweats, decreased interest in sex, mood swings, headaches, or lack of sleep.  Treatment for this condition may include hormone replacement therapy.  Take actions to keep yourself healthy, including exercising regularly, eating a healthy diet, watching your weight, and checking your blood pressure and blood sugar levels.  Get screened for cancer and depression. Make sure that you are up to date with all your vaccines. This information is not intended to replace advice given to you by your health care provider. Make sure you discuss any questions you have with your health care provider. Document Released: 10/05/2005 Document Revised: 08/06/2018 Document Reviewed: 08/06/2018 Elsevier Patient Education  2020 [employer].

## 2019-06-18 NOTE — Progress Notes (Signed)
Patient ID: Kathy [name], female    DOB: Jul 21, 1956  Age: 63 y.o. MRN: [id number]  The patient is here for annual preventive examination and management of other chronic and acute problems.   The risk factors are reflected in the social history.  The roster of all physicians providing medical care to patient - is listed in the Snapshot section of the chart.  Activities of daily living:  The patient is 100% independent in all ADLs: dressing, toileting, feeding as well as independent mobility  Home safety : The patient has smoke detectors in the home. They wear seatbelts.  There are no firearms at home. There is no violence in the home.   There is no risks for hepatitis, STDs or HIV. There is no   history of blood transfusion. They have no travel history to infectious disease endemic areas of the world.  The patient has seen their dentist in the last six month. They have seen their eye doctor in the last year. They admit to slight hearing difficulty with regard to whispered voices and some television programs.  They have deferred audiologic testing in the last year.  They do not  have excessive sun exposure. Discussed the need for sun protection: hats, long sleeves and use of sunscreen if there is significant sun exposure.   Diet: the importance of a healthy diet is discussed. They do have a healthy diet.  The benefits of regular aerobic exercise were discussed. She walks 4 times per week ,  20 minutes.   Depression screen: there are no signs or vegative symptoms of depression- irritability, change in appetite, anhedonia, sadness/tearfullness.  Cognitive assessment: the patient manages all their financial and personal affairs and is actively engaged. They could relate day,date,year and events; recalled 2/3 objects at 3 minutes; performed clock-face test normally.  The following portions of the patient's history were reviewed and updated as appropriate: allergies, current medications, past family  history, past medical history,  past surgical history, past social history  and problem list.  Visual acuity was not assessed per patient preference since she has regular follow up with her ophthalmologist. Hearing and body mass index were assessed and reviewed.   During the course of the visit the patient was educated and counseled about appropriate screening and preventive services including : fall prevention , diabetes screening, nutrition counseling, colorectal cancer screening, and recommended immunizations.    CC: The primary encounter diagnosis was Chronic right hip pain. Diagnoses of Breast cancer screening by mammogram, Essential hypertension, Hyperlipidemia associated with type 2 diabetes mellitus ([place]), Diabetes mellitus without complication ([place]), Numbness of left hand, Fatigue, unspecified type, Encounter for preventive health examination, and Degenerative lumbar spinal stenosis were also pertinent to this visit.  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 Her pain is affecting her walking program . Wants to see Blanche [facility] at [facility] .  Taking aleve for pain as needed, but not daily   Bilateral hip pain . No x rays on file  Told by chiropractor she needed a hip replacement . Pain is worst in the right inguinal region   HTN:  Blood pressure has improved . Taking telmisartan only   Refuses flu vaccine and pneumovax  vaccines.  The patient has no signs or symptoms of COVID 19 infection (fever, cough, sore throat  or shortness of breath beyond what is typical for patient).  Patient denies contact with other persons  with the above mentioned symptoms or with anyone confirmed to have [place] has a past medical history of Arthritis, GERD (gastroesophageal reflux disease), and Wears contact lenses.   She has a past surgical history that includes fatty tissue under bilateral arm;  Colonoscopy (2006); and Colonoscopy (N/A, 01/03/2016).   Her family history includes Brain cancer in her brother; Breast cancer (age of onset: 48) in her sister; Colon cancer in her father; Diabetes in her mother; Hyperlipidemia in her mother; Hypertension in her mother.She reports that she has never smoked. She has never used smokeless tobacco. She reports current alcohol use of about 10.0 standard drinks of alcohol per week. She reports that she does not use drugs.  Outpatient Medications Prior to Visit  Medication Sig Dispense Refill  . Ascorbic Acid (VITAMIN C) 1000 MG tablet Take 1,000 mg by mouth daily.    Marland Kitchen aspirin 81 MG tablet Take 81 mg by mouth daily.    . Cholecalciferol (VITAMIN D3) 10000 UNITS capsule Take 10,000 Units by mouth daily. Reported on 12/27/2015    . Cranberry (SM CRANBERRY) 300 MG tablet Take 300 mg by mouth daily.    . Cyanocobalamin 5000 MCG SUBL Place 1 tablet under the tongue daily.    . diphenhydrAMINE (BENADRYL) 25 MG tablet Take 25 mg by mouth every 6 (six) hours as needed.    Marland Kitchen KRILL OIL PO Take 1 capsule by mouth daily.    Marland Kitchen MAGNESIUM GLUCONATE PO Take 400 mg by mouth.    . Multiple Vitamins-Minerals (MULTIVITAMIN WITH MINERALS) tablet Take 1 tablet by mouth 2 (two) times daily.    . naproxen sodium (ANAPROX) 220 MG tablet Take 220 mg by mouth daily.    . Red Yeast Rice Extract 600 MG CAPS Take 1 capsule (600 mg total) by mouth 2 (two) times daily at 10 AM and 5 PM. 60 capsule 3  . telmisartan (MICARDIS) 20 MG tablet Take 1 tablet (20 mg total) by mouth at bedtime. 90 tablet 1   No facility-administered medications prior to visit.     Review of Systems   Patient denies headache, fevers, malaise, unintentional weight loss, skin rash, eye pain, sinus congestion and sinus pain, sore throat, dysphagia,  hemoptysis , cough, dyspnea, wheezing, chest pain, palpitations, orthopnea, edema, abdominal pain, nausea, melena, diarrhea, constipation, flank pain, dysuria,  hematuria, urinary  Frequency, nocturia, numbness, tingling, seizures,  Focal weakness, Loss of consciousness,  Tremor, insomnia, depression, anxiety, and suicidal ideation.     Objective:  BP 128/70 (BP Location: Left Arm, Patient Position: Sitting, Cuff Size: Normal)   Pulse 71   Temp (!) 97.4 F (36.3 C) (Temporal)   Resp 15   Ht 5\' 6"  (1.676 m)   Wt 177 lb 12.8 oz (80.6 kg)   SpO2 99%   BMI 28.70 kg/m   Physical Exam  General appearance: alert, cooperative and appears stated age Head: Normocephalic, without obvious abnormality, atraumatic Eyes: conjunctivae/corneas clear. PERRL, EOM's intact. Fundi benign. Ears: normal TM's and external ear canals both ears Nose: Nares normal. Septum midline. Mucosa normal. No drainage or sinus tenderness. Throat: lips, mucosa, and tongue normal; teeth and gums normal Neck: no adenopathy, no carotid bruit, no JVD, supple, symmetrical, trachea midline and thyroid not enlarged, symmetric, no tenderness/mass/nodules Lungs: clear to auscultation bilaterally Breasts: normal appearance, no masses or tenderness Heart: regular rate and rhythm, S1, S2 normal, no murmur, click, rub or gallop Abdomen: soft, non-tender; bowel sounds normal; no masses,  no  organomegaly Extremities: extremities normal, atraumatic, no cyanosis or edema Pulses: 2+ and symmetric Skin: Skin color, texture, turgor normal. No rashes or lesions Neurologic: Alert and oriented X 3, normal strength and tone. Normal symmetric reflexes. Normal coordination and gait.     Assessment & Plan:   Problem List Items Addressed This Visit      Unprioritized   Encounter for preventive health examination    age appropriate education and counseling updated, referrals for preventative services and immunizations addressed, dietary and smoking counseling addressed, most recent labs reviewed.  I have personally reviewed and have noted:  1) the patient's medical and social history 2) The pt's  use of alcohol, tobacco, and illicit drugs 3) The patient's current medications and supplements 4) Functional ability including ADL's, fall risk, home safety risk, hearing and visual impairment 5) Diet and physical activities 6) Evidence for depression or mood disorder 7) The patient's height, weight, and BMI have been recorded in the chart  I have made referrals, and provided counseling and education based on review of the above      Diabetes mellitus without complication ([place])    She has gained weight but her a1c has remained low  .  I have  encouraged  Continued weight loss with goal of 10% of body weigh over the next 6 months using a low glycemic index diet and regular exercise a minimum of 5 days per week.    Lab Results  Component Value Date   HGBA1C 6.3 06/18/2019   Lab Results  Component Value Date   MICROALBUR <0.7 11/20/2018         Relevant Orders   Comprehensive metabolic panel (Completed)   Hemoglobin A1c (Completed)   Hyperlipidemia associated with type 2 diabetes mellitus ([place])    She defers statin therapy .  HDL is high    Lab Results  Component Value Date   CHOL 222 (H) 06/18/2019   HDL 85.10 06/18/2019   LDLCALC 126 (H) 06/18/2019   TRIG 50.0 06/18/2019   CHOLHDL 3 06/18/2019         Relevant Orders   Lipid panel (Completed)   Chronic right hip pain - Primary    Secondary to moderate DDD with remodeling of femoral head and subchondral cyst formation.  Advised to have orthopedics evaluation in addition to neurosurgical evaluation       Relevant Orders   DG Hip Unilat W OR W/O Pelvis 2-3 Views Right (Completed)   Degenerative lumbar spinal stenosis    Found during workup for L4 vertebral mass (none found).  Pain is interfering with exerise but she is not taking any daily medication for pain. Referral to [facility] requested       Relevant Orders   Ambulatory referral to Neurosurgery   Hypertension   Numbness of left hand    Other Visit  Diagnoses    Breast cancer screening by mammogram       Relevant Orders   MM 3D SCREEN BREAST BILATERAL   Fatigue, unspecified type       Relevant Orders   TSH (Completed)   CBC with Differential/Platelet (Completed)      I am having Amado Coe. Deshaies maintain her Vitamin D3, naproxen sodium, KRILL OIL PO, Cyanocobalamin, Cranberry, aspirin, multivitamin with minerals, vitamin C, diphenhydrAMINE, MAGNESIUM GLUCONATE PO, Red Yeast Rice Extract, and telmisartan.  No orders of the defined types were placed in this encounter.   There are no discontinued medications.  Follow-up: Return in about 6 months (  around 12/17/2019) for follow up diabetes.   Crecencio Mc, MD

## 2019-06-18 NOTE — Assessment & Plan Note (Signed)
Secondary to moderate DDD with remodeling of femoral head and subchondral cyst formation.  Advised to have orthopedics evaluation in addition to neurosurgical evaluation

## 2019-06-18 NOTE — Assessment & Plan Note (Signed)
She defers statin therapy .  HDL is high    Lab Results  Component Value Date   CHOL 222 (H) 06/18/2019   HDL 85.10 06/18/2019   LDLCALC 126 (H) 06/18/2019   TRIG 50.0 06/18/2019   CHOLHDL 3 06/18/2019

## 2019-06-18 NOTE — Assessment & Plan Note (Signed)
Found during workup for L4 vertebral mass (none found).  Pain is interfering with exerise but she is not taking any daily medication for pain. Referral to Bl[facility] requested

## 2019-06-18 NOTE — Assessment & Plan Note (Signed)

## 2019-06-26 ENCOUNTER — Encounter: Payer: Self-pay | Admitting: Internal Medicine

## 2019-07-06 ENCOUNTER — Other Ambulatory Visit: Payer: Self-pay | Admitting: Internal Medicine

## 2019-07-06 DIAGNOSIS — M48061 Spinal stenosis, lumbar region without neurogenic claudication: Secondary | ICD-10-CM

## 2019-07-08 ENCOUNTER — Telehealth: Payer: Self-pay | Admitting: Internal Medicine

## 2019-07-08 NOTE — Telephone Encounter (Signed)
Message sent to patient, requesting next choice of [facility]

## 2019-07-08 NOTE — Telephone Encounter (Signed)
Copied from [place] [phone]. Topic: Referral - Status >> Jul 08, 2019  9:21 AM Berneta Levins wrote: Reason for CRM:   Junie Panning from [facility] calling.  States that they received the referral on this patient, but they do not accept her insurance. Junie Panning can be reached at [phone] x.[place]

## 2019-07-08 NOTE — Telephone Encounter (Signed)
   Copied from [place] [phone]. Topic: Referral - Status >> Jul 08, 2019  9:21 AM Berneta Levins wrote: Reason for CRM:   Junie Panning from [facility] calling.  States that they received the referral on this patient, but they do not accept her insurance. Junie Panning can be reached at [phone] x.[place]

## 2019-07-09 ENCOUNTER — Other Ambulatory Visit: Payer: Self-pay | Admitting: Internal Medicine

## 2019-07-09 DIAGNOSIS — G8929 Other chronic pain: Secondary | ICD-10-CM

## 2019-07-09 DIAGNOSIS — M25551 Pain in right hip: Secondary | ICD-10-CM

## 2019-07-09 NOTE — Telephone Encounter (Signed)
Making referral to [facility] per another request from patient ,  for Kathy [name] . If he is not in network, let me know.  Thanks

## 2019-07-09 NOTE — Assessment & Plan Note (Signed)
Referral to Zollie Beckers at [facility] requested and in progress.

## 2019-07-16 ENCOUNTER — Ambulatory Visit
Admission: RE | Admit: 2019-07-16 | Discharge: 2019-07-16 | Disposition: A | Discharge status: Home / Self Care | Payer: BLUE CROSS/BLUE SHIELD | Source: Ambulatory Visit | Attending: Internal Medicine | Admitting: Internal Medicine

## 2019-07-16 DIAGNOSIS — Z1231 Encounter for screening mammogram for malignant neoplasm of breast: Secondary | ICD-10-CM | POA: Diagnosis not present

## 2019-07-20 ENCOUNTER — Encounter: Payer: Self-pay | Admitting: Orthopaedic Surgery

## 2019-07-20 ENCOUNTER — Ambulatory Visit: Payer: BLUE CROSS/BLUE SHIELD | Admitting: Orthopaedic Surgery

## 2019-07-20 ENCOUNTER — Other Ambulatory Visit: Payer: Self-pay

## 2019-07-20 VITALS — Ht 66.0 in | Wt 178.0 lb

## 2019-07-20 DIAGNOSIS — M1611 Unilateral primary osteoarthritis, right hip: Secondary | ICD-10-CM

## 2019-07-20 DIAGNOSIS — M1612 Unilateral primary osteoarthritis, left hip: Secondary | ICD-10-CM

## 2019-07-20 NOTE — Progress Notes (Signed)
Office Visit Note   Patient: Kathy [name]           Date of Birth: Feb 07, 1956           MRN: [id number] Visit Date: 07/20/2019              Requested by: Crecencio Mc, MD [id number] [place],  [place] [address] PCP: Crecencio Mc, MD   Assessment & Plan: Visit Diagnoses:  1. Unilateral primary osteoarthritis, left hip   2. Unilateral primary osteoarthritis, right hip     Plan: I went over x-rays with her in detail.  At this point we are recommending a hip replacement surgery for the right hip.  I would not be an advocate for an intra-articular steroid injection given that there is already some collapse of the femoral head and cystic change in the femoral head and acetabulum.  Given the severity of her pain combined with her clinical exam and x-ray findings I do feel a hip replacement is warranted as the she.  I spent some time in the office showing her hip model and go over her x-rays.  I talked about what hip replacement surgery involves and gave her handout about this.  We talked about the risk and benefits of surgery and described her interoperative and postoperative course.  All question concerns were answered and addressed.  She is interested in having this scheduled.  Follow-Up Instructions: Return for 2 weeks post-op.   Orders:  No orders of the defined types were placed in this encounter.  No orders of the defined types were placed in this encounter.     Procedures: No procedures performed   Clinical Data: No additional findings.   Subjective: Chief Complaint  Patient presents with  . Right Hip - Pain  The patient is coming today with worsening right hip pain this been going on for about 2 years but got much more worse starting around March of this year.  She hurts in the groin.  She is walking with a limp.  At this point her right hip pain can be 10 out of 10 on a daily basis.  It is detrimentally affecting her mobility, her quality of life and  her actives of daily living.  She does report significant right hip stiffness and difficulty with putting her shoes and socks on.  She also has stiffness in her left hip.  She is not a diabetic.  She does take an aspirin daily.  She has no other active medical issues.  X-rays are on the canopy system for [place] to review of her hips.  She is very active 63 year old female  HPI  Review of Systems She currently denies any headache, chest pain, shortness of breath, fever, chills, nausea, vomiting  Objective: Vital Signs: Ht 5\' 6"  (1.676 m)   Wt 178 lb (80.7 kg)   BMI 28.73 kg/m   Physical Exam She is alert and orient x3 and in no acute distress Ortho Exam Examination of both hips show they are significantly stiff with internal and external rotation.  She has severe pain with internal X rotation of the right hip and mild pain the left hip. Specialty Comments:  No specialty comments available.  Imaging: No results found. X-rays independently reviewed of both hips shows severe end-stage arthritis of the right hip.  The acetabular is actually indenting the femoral head on the right side.  There is cystic changes in the femoral head and acetabulum.  There  is severe joint space narrowing as well as periarticular osteophytes.  There is significant to severe arthritis in the left hip as well.  PMFS History: Patient Active Problem List   Diagnosis Date Noted  . Unilateral primary osteoarthritis, right hip 07/20/2019  . Unilateral primary osteoarthritis, left hip 07/20/2019  . Chronic right hip pain 06/18/2019  . Degenerative lumbar spinal stenosis 06/18/2019  . Hypertension 11/22/2018  . Hyperlipidemia associated with type 2 diabetes mellitus ([place]) 11/22/2018  . Cervical spine arthritis 05/14/2017  . Diabetes mellitus without complication ([place]) 99991111  . Numbness of left hand 05/07/2017  . Dyspepsia 06/23/2015  . Encounter for preventive health examination 12/21/2013  . Screening for breast  cancer 11/17/2013  . Osteoarthritis 11/17/2013  . Menopause 11/17/2013   Past Medical History:  Diagnosis Date  . Arthritis    mild  . GERD (gastroesophageal reflux disease)    occasional  . Wears contact lenses     Family History  Problem Relation Age of Onset  . Hyperlipidemia Mother   . Hypertension Mother   . Diabetes Mother   . Colon cancer Father   . Breast cancer Sister 18  . Brain cancer Brother     Past Surgical History:  Procedure Laterality Date  . COLONOSCOPY  2006  . COLONOSCOPY N/A 01/03/2016   Procedure: COLONOSCOPY;  Surgeon: Hulen Luster, MD;  Location: [facility];  Service: Gastroenterology;  Laterality: N/A;  . fatty tissue under bilateral arm     Social History   Occupational History  . Not on file  Tobacco Use  . Smoking status: Never Smoker  . Smokeless tobacco: Never Used  Substance and Sexual Activity  . Alcohol use: Yes    Alcohol/week: 10.0 standard drinks    Types: 10 Cans of beer per week    Comment:    . Drug use: No  . Sexual activity: Never

## 2019-07-29 ENCOUNTER — Other Ambulatory Visit: Payer: Self-pay

## 2019-08-03 ENCOUNTER — Other Ambulatory Visit: Payer: Self-pay | Admitting: Physician Assistant

## 2019-08-04 ENCOUNTER — Other Ambulatory Visit
Admission: RE | Admit: 2019-08-04 | Discharge: 2019-08-04 | Disposition: A | Discharge status: Home / Self Care | Payer: BLUE CROSS/BLUE SHIELD | Source: Ambulatory Visit | Attending: Orthopaedic Surgery | Admitting: Orthopaedic Surgery

## 2019-08-04 DIAGNOSIS — Z01812 Encounter for preprocedural laboratory examination: Secondary | ICD-10-CM | POA: Insufficient documentation

## 2019-08-04 DIAGNOSIS — Z20828 Contact with and (suspected) exposure to other viral communicable diseases: Secondary | ICD-10-CM | POA: Insufficient documentation

## 2019-08-04 NOTE — Patient Instructions (Addendum)
DUE TO COVID-19 ONLY ONE VISITOR IS ALLOWED TO COME WITH YOU AND STAY IN THE WAITING ROOM ONLY DURING PRE OP AND PROCEDURE DAY OF SURGERY. THE 1 VISITOR MAY VISIT WITH YOU AFTER SURGERY IN YOUR PRIVATE ROOM DURING VISITING HOURS ONLY!  YOU NEED TO HAVE A COVID 19 TEST ON: 08/04/2019. THIS TEST MUST BE DONE BEFORE SURGERY, COME  [address], [place] , [address].  ([place]) ONCE YOUR COVID TEST IS COMPLETED, PLEASE BEGIN THE QUARANTINE INSTRUCTIONS AS OUTLINED IN YOUR HANDOUT.                Kathy [name]    Your procedure is scheduled on: 08/07/2019   Report to [facility] Main  Entrance   Report to admitting at 7:15 AM     Call this number if you have problems the morning of surgery [phone]    Remember:     [place], NO [place].     Take these medicines the morning of surgery with A SIP OF WATER: tylenol if needed                                 You may not have any metal on your body including hair pins and              piercings  Do not wear jewelry, make-up, lotions, powders or perfumes, deodorant             Do not wear nail polish on your fingernails.  Do not shave 48 hours prior to surgery.             Do not bring valuables to the hospital. [place].  Contacts, dentures or bridgework may not be worn into surgery.  Leave suitcase in the car. After surgery it may be brought to your room.      _____________________________________________________________________  NO SOLID FOOD AFTER MIDNIGHT THE NIGHT PRIOR TO SURGERY. NOTHING BY MOUTH EXCEPT CLEAR LIQUIDS UNTIL . PLEASE FINISH G2 Gatorade DRINK PER SURGEON ORDER  WHICH NEEDS TO BE COMPLETED AT: 6:45 AM.             CLEAR LIQUID DIET   Foods Allowed                                                                     Foods Excluded  Coffee and tea,  regular and decaf                             liquids that you cannot  Plain Jell-O any favor except red or purple                                           see through such as: Fruit ices (not with fruit pulp)  milk, soups, orange juice  Iced Popsicles                                    All solid food Carbonated beverages, regular and diet                                    Cranberry, grape and apple juices Sports drinks like Gatorade Lightly seasoned clear broth or consume(fat free) Sugar, honey syrup  Sample Menu Breakfast                                Lunch                                     Supper Cranberry juice                    Beef broth                            Chicken broth Jell-O                                     Grape juice                           Apple juice Coffee or tea                        Jell-O                                      Popsicle                                                Coffee or tea                        Coffee or tea  _____________________________________________________________________    [facility] - Preparing for Surgery Before surgery, you can play an important role.  Because skin is not sterile, your skin needs to be as free of germs as possible.  You can reduce the number of germs on your skin by washing with CHG (chlorahexidine gluconate) soap before surgery.  CHG is an antiseptic cleaner which kills germs and bonds with the skin to continue killing germs even after washing. Please DO NOT use if you have an allergy to CHG or antibacterial soaps.  If your skin becomes reddened/irritated stop using the CHG and inform your nurse when you arrive at Short Stay. Do not shave (including legs and underarms) for at least 48 hours prior to the first CHG shower.  You may shave your face/neck. Please follow these instructions carefully:  1.  Shower with CHG Soap the night before surgery and the  morning of  Surgery.  2.  If you  choose to wash your hair, wash your hair first as usual with your  normal  shampoo.  3.  After you shampoo, rinse your hair and body thoroughly to remove the  shampoo.                           4.  Use CHG as you would any other liquid soap.  You can apply chg directly  to the skin and wash                       Gently with a scrungie or clean washcloth.  5.  Apply the CHG Soap to your body ONLY FROM THE NECK DOWN.   Do not use on face/ open                           Wound or open sores. Avoid contact with eyes, ears mouth and genitals (private parts).                       Wash face,  Genitals (private parts) with your normal soap.             6.  Wash thoroughly, paying special attention to the area where your surgery  will be performed.  7.  Thoroughly rinse your body with warm water from the neck down.  8.  DO NOT shower/wash with your normal soap after using and rinsing off  the CHG Soap.                9.  Pat yourself dry with a clean towel.            10.  Wear clean pajamas.            11.  Place clean sheets on your bed the night of your first shower and do not  sleep with pets. Day of Surgery : Do not apply any lotions/deodorants the morning of surgery.  Please wear clean clothes to the hospital/surgery center.  FAILURE TO FOLLOW THESE INSTRUCTIONS MAY RESULT IN THE CANCELLATION OF YOUR SURGERY PATIENT SIGNATURE_________________________________  NURSE SIGNATURE__________________________________  ________________________________________________________________________   Kathy [name]  An incentive spirometer is a tool that can help keep your lungs clear and active. This tool measures how well you are filling your lungs with each breath. Taking long deep breaths may help reverse or decrease the chance of developing breathing (pulmonary) problems (especially infection) following:  A long period of time when you are unable to move or be active. BEFORE  THE PROCEDURE   If the spirometer includes an indicator to show your best effort, your nurse or respiratory therapist will set it to a desired goal.  If possible, sit up straight or lean slightly forward. Try not to slouch.  Hold the incentive spirometer in an upright position. INSTRUCTIONS FOR USE  1. Sit on the edge of your bed if possible, or sit up as far as you can in bed or on a chair. 2. Hold the incentive spirometer in an upright position. 3. Breathe out normally. 4. Place the mouthpiece in your mouth and seal your lips tightly around it. 5. Breathe in slowly and as deeply as possible, raising the piston or the ball toward the top of the column. 6. Hold your breath for 3-5 seconds or for as long as possible. Allow the piston or ball to fall to  the bottom of the column. 7. Remove the mouthpiece from your mouth and breathe out normally. 8. Rest for a few seconds and repeat Steps 1 through 7 at least 10 times every 1-2 hours when you are awake. Take your time and take a few normal breaths between deep breaths. 9. The spirometer may include an indicator to show your best effort. Use the indicator as a goal to work toward during each repetition. 10. After each set of 10 deep breaths, practice coughing to be sure your lungs are clear. If you have an incision (the cut made at the time of surgery), support your incision when coughing by placing a pillow or rolled up towels firmly against it. Once you are able to get out of bed, walk around indoors and cough well. You may stop using the incentive spirometer when instructed by your caregiver.  RISKS AND COMPLICATIONS  Take your time so you do not get dizzy or light-headed.  If you are in pain, you may need to take or ask for pain medication before doing incentive spirometry. It is harder to take a deep breath if you are having pain. AFTER USE  Rest and breathe slowly and easily.  It can be helpful to keep track of a log of your progress.  Your caregiver can provide you with a simple table to help with this. If you are using the spirometer at home, follow these instructions: [place] IF:   You are having difficultly using the spirometer.  You have trouble using the spirometer as often as instructed.  Your pain medication is not giving enough relief while using the spirometer.  You develop fever of 100.5 F (38.1 C) or higher. SEEK IMMEDIATE MEDICAL CARE IF:   You cough up bloody sputum that had not been present before.  You develop fever of 102 F (38.9 C) or greater.  You develop worsening pain at or near the incision site. MAKE SURE YOU:   Understand these instructions.  Will watch your condition.  Will get help right away if you are not doing well or get worse. Document Released: 12/24/2006 Document Revised: 11/05/2011 Document Reviewed: 02/24/2007 [facility] Patient Information 2014 [place], [place].   ________________________________________________________________________

## 2019-08-05 ENCOUNTER — Encounter (HOSPITAL_COMMUNITY)
Admission: RE | Admit: 2019-08-05 | Discharge: 2019-08-05 | Disposition: A | Discharge status: Home / Self Care | Payer: BLUE CROSS/BLUE SHIELD | Source: Ambulatory Visit | Attending: Orthopaedic Surgery | Admitting: Orthopaedic Surgery

## 2019-08-05 ENCOUNTER — Encounter (HOSPITAL_COMMUNITY): Payer: Self-pay

## 2019-08-05 ENCOUNTER — Telehealth: Payer: Self-pay | Admitting: Orthopaedic Surgery

## 2019-08-05 ENCOUNTER — Other Ambulatory Visit: Payer: Self-pay

## 2019-08-05 DIAGNOSIS — Z01818 Encounter for other preprocedural examination: Secondary | ICD-10-CM | POA: Diagnosis not present

## 2019-08-05 DIAGNOSIS — R9431 Abnormal electrocardiogram [ECG] [EKG]: Secondary | ICD-10-CM | POA: Diagnosis not present

## 2019-08-05 DIAGNOSIS — M1611 Unilateral primary osteoarthritis, right hip: Secondary | ICD-10-CM | POA: Insufficient documentation

## 2019-08-05 HISTORY — DX: Type 2 diabetes mellitus without complications: E11.9

## 2019-08-05 HISTORY — DX: Essential (primary) hypertension: I10

## 2019-08-05 LAB — CBC
HCT: 36.3 % (ref 36.0–46.0)
Hemoglobin: 11.7 g/dL — ABNORMAL LOW (ref 12.0–15.0)
MCH: 29.1 pg (ref 26.0–34.0)
MCHC: 32.2 g/dL (ref 30.0–36.0)
MCV: 90.3 fL (ref 80.0–100.0)
Platelets: 325 10*3/uL (ref 150–400)
RBC: 4.02 MIL/uL (ref 3.87–5.11)
RDW: 12.7 % (ref 11.5–15.5)
WBC: 6.2 10*3/uL (ref 4.0–10.5)
nRBC: 0 % (ref 0.0–0.2)

## 2019-08-05 LAB — BASIC METABOLIC PANEL
Anion gap: 10 (ref 5–15)
BUN: 19 mg/dL (ref 8–23)
CO2: 27 mmol/L (ref 22–32)
Calcium: 9.8 mg/dL (ref 8.9–10.3)
Chloride: 102 mmol/L (ref 98–111)
Creatinine, Ser: 0.95 mg/dL (ref 0.44–1.00)
GFR calc Af Amer: >60 mL/min (ref >60)
GFR calc non Af Amer: >60 mL/min (ref >60)
Glucose, Bld: 118 mg/dL — ABNORMAL HIGH (ref 70–99)
Potassium: 4 mmol/L (ref 3.5–5.1)
Sodium: 139 mmol/L (ref 135–145)

## 2019-08-05 LAB — GLUCOSE, CAPILLARY: Glucose-Capillary: 108 mg/dL — ABNORMAL HIGH (ref 70–99)

## 2019-08-05 LAB — SURGICAL PCR SCREEN
MRSA, PCR: NEGATIVE
Staphylococcus aureus: POSITIVE — AB

## 2019-08-05 LAB — SARS CORONAVIRUS 2 (TAT 6-24 HRS): SARS Coronavirus 2: NEGATIVE

## 2019-08-05 NOTE — Telephone Encounter (Signed)
Pt called in said she is scheduled to have surgery with Dr.Blackman 08/07/2019 and she hasn't been told which medications to stop taking prior to surgery. Please give her a call [phone]

## 2019-08-05 NOTE — Progress Notes (Signed)
PCP - Dr. Deborra Medina Cardiologist -   Chest x-ray -  EKG - 08/05/2019 Stress Test -  ECHO -  Cardiac Cath -   Sleep Study -  CPAP -   Fasting Blood Sugar -  Checks Blood Sugar _____ times a day  Blood Thinner Instructions:Pt. Was advised to call Orthopedic office and ask the RN when she needs to stop Aspirin Aspirin Instructions: Last Dose:  Anesthesia review:   Patient denies shortness of breath, fever, cough and chest pain at PAT appointment   Patient verbalized understanding of instructions that were given to them at the PAT appointment. Patient was also instructed that they will need to review over the PAT instructions again at home before surgery.

## 2019-08-05 NOTE — Telephone Encounter (Signed)
Patient aware to stop her anti-inflammatories and vitamins

## 2019-08-06 NOTE — H&P (Signed)
TOTAL HIP ADMISSION H&P  Patient is admitted for right total hip arthroplasty.  Subjective:  Chief Complaint: right hip pain  HPI: Kathy [name], 63 y.o. female, has a history of pain and functional disability in the right hip(s) due to arthritis and patient has failed non-surgical conservative treatments for greater than 12 weeks to include NSAID's and/or analgesics, corticosteriod injections, flexibility and strengthening excercises, use of assistive devices and activity modification.  Onset of symptoms was gradual starting 3 years ago with rapidlly worsening course since that time.The patient noted no past surgery on the right hip(s).  Patient currently rates pain in the right hip at 10 out of 10 with activity. Patient has night pain, worsening of pain with activity and weight bearing, trendelenberg gait, pain that interfers with activities of daily living and pain with passive range of motion. Patient has evidence of subchondral cysts, subchondral sclerosis, periarticular osteophytes and joint space narrowing by imaging studies. This condition presents safety issues increasing the risk of falls.  There is no current active infection.  Patient Active Problem List   Diagnosis Date Noted  . Unilateral primary osteoarthritis, right hip 07/20/2019  . Unilateral primary osteoarthritis, left hip 07/20/2019  . Chronic right hip pain 06/18/2019  . Degenerative lumbar spinal stenosis 06/18/2019  . Hypertension 11/22/2018  . Hyperlipidemia associated with type 2 diabetes mellitus ([place]) 11/22/2018  . Cervical spine arthritis 05/14/2017  . Diabetes mellitus without complication ([place]) 99991111  . Numbness of left hand 05/07/2017  . Dyspepsia 06/23/2015  . Encounter for preventive health examination 12/21/2013  . Screening for breast cancer 11/17/2013  . Osteoarthritis 11/17/2013  . Menopause 11/17/2013   Past Medical History:  Diagnosis Date  . Arthritis    mild  . Diabetes mellitus without  complication (HCC)    Type II,per PCP Dr. Derrel Nip after visit sumary  . GERD (gastroesophageal reflux disease)    occasional  . Hypertension   . Wears contact lenses     Past Surgical History:  Procedure Laterality Date  . COLONOSCOPY  2006  . COLONOSCOPY N/A 01/03/2016   Procedure: COLONOSCOPY;  Surgeon: Hulen Luster, MD;  Location: [facility];  Service: Gastroenterology;  Laterality: N/A;  . fatty tissue under bilateral arm      No current facility-administered medications for this encounter.   Current Outpatient Medications  Medication Sig Dispense Refill Last Dose  . acetaminophen (TYLENOL) 500 MG tablet Take 500-1,000 mg by mouth every 6 (six) hours as needed (pain.).     Marland Kitchen Ascorbic Acid (VITAMIN C) 1000 MG tablet Take 1,000 mg by mouth daily.     Marland Kitchen aspirin 81 MG tablet Take 81 mg by mouth daily.     . Cholecalciferol (VITAMIN D3) 10000 UNITS capsule Take 10,000 Units by mouth daily. Reported on 12/27/2015     . Cranberry (SM CRANBERRY) 300 MG tablet Take 300 mg by mouth daily.     . Cyanocobalamin (VITAMIN B-12) 5000 MCG SUBL Take 5,000 mcg by mouth daily.     Javier Docker Oil 500 MG CAPS Take 500 mg by mouth daily.     Marland Kitchen MAGNESIUM GLUCONATE PO Take 800 mg by mouth daily.      . Multiple Vitamins-Minerals (ADULT ONE DAILY GUMMIES PO) Take 2 tablets by mouth daily. Alive Women 50+     . naproxen sodium (ANAPROX) 220 MG tablet Take 220 mg by mouth daily.     . Red Yeast Rice Extract 600 MG CAPS Take 1 capsule (600 mg  total) by mouth 2 (two) times daily at 10 AM and 5 PM. (Patient taking differently: Take 1,200 mg by mouth daily. ) 60 capsule 3   . telmisartan (MICARDIS) 20 MG tablet Take 1 tablet (20 mg total) by mouth at bedtime. 90 tablet 1    Allergies  Allergen Reactions  . Neosporin [Neomycin-Bacitracin Zn-Polymyx] Rash    Social History   Tobacco Use  . Smoking status: Never Smoker  . Smokeless tobacco: Never Used  Substance Use Topics  . Alcohol use: Yes     Alcohol/week: 10.0 standard drinks    Types: 10 Cans of beer per week    Comment: occasional    Family History  Problem Relation Age of Onset  . Hyperlipidemia Mother   . Hypertension Mother   . Diabetes Mother   . Colon cancer Father   . Breast cancer Sister 10  . Brain cancer Brother      Review of Systems  All other systems reviewed and are negative.   Objective:  Physical Exam  Constitutional: She is oriented to person, place, and time. She appears well-developed and well-nourished.  HENT:  Head: Normocephalic and atraumatic.  Eyes: Pupils are equal, round, and reactive to light. EOM are normal.  Cardiovascular: Normal rate and regular rhythm.  Respiratory: Effort normal and breath sounds normal.  GI: Soft. Bowel sounds are normal.  Musculoskeletal:     Cervical back: Normal range of motion and neck supple.     Right hip: Tenderness and bony tenderness present. Decreased range of motion. Decreased strength.  Neurological: She is alert and oriented to person, place, and time.  Skin: Skin is warm and dry.  Psychiatric: She has a normal mood and affect.    Vital signs in last 24 hours:    Labs:   Estimated body mass index is 29.21 kg/m as calculated from the following:   Height as of 08/05/19: 5\' 6"  (1.676 m).   Weight as of 08/05/19: 82.1 kg.   Imaging Review Plain radiographs demonstrate severe degenerative joint disease of the right hip(s). The bone quality appears to be good for age and reported activity level.      Assessment/Plan:  End stage arthritis, right hip(s)  The patient history, physical examination, clinical judgement of the provider and imaging studies are consistent with end stage degenerative joint disease of the right hip(s) and total hip arthroplasty is deemed medically necessary. The treatment options including medical management, injection therapy, arthroscopy and arthroplasty were discussed at length. The risks and benefits of total hip  arthroplasty were presented and reviewed. The risks due to aseptic loosening, infection, stiffness, dislocation/subluxation,  thromboembolic complications and other imponderables were discussed.  The patient acknowledged the explanation, agreed to proceed with the plan and consent was signed. Patient is being admitted for inpatient treatment for surgery, pain control, PT, OT, prophylactic antibiotics, VTE prophylaxis, progressive ambulation and ADL's and discharge planning.The patient is planning to be discharged home with home health services

## 2019-08-06 NOTE — Anesthesia Preprocedure Evaluation (Addendum)
Anesthesia Evaluation  Patient identified by MRN, date of birth, ID band Patient awake    Reviewed: Allergy & Precautions, NPO status , Patient's Chart, lab work & pertinent test results  Airway Mallampati: II  TM Distance: >3 FB Neck ROM: Full    Dental  (+) Teeth Intact, Dental Advisory Given   Pulmonary neg pulmonary ROS,    Pulmonary exam normal        Cardiovascular hypertension, Pt. on medications negative cardio ROS Normal cardiovascular exam     Neuro/Psych negative neurological ROS  negative psych ROS   GI/Hepatic GERD  Controlled,(+)     substance abuse  alcohol use,   Endo/Other  diabetes, Type 2No meds, diet controlled  Renal/GU negative Renal ROS  negative genitourinary   Musculoskeletal  (+) Arthritis , Osteoarthritis,    Abdominal Normal abdominal exam  (+)   Peds negative pediatric ROS (+)  Hematology negative hematology ROS (+)   Anesthesia Other Findings   Reproductive/Obstetrics negative OB ROS                           Anesthesia Physical  Anesthesia Plan  ASA: III  Anesthesia Plan: Spinal   Post-op Pain Management:    Induction:   PONV Risk Score and Plan: 2 and Propofol infusion, TIVA and Treatment may vary due to age or medical condition  Airway Management Planned: Natural Airway and Simple Face Mask  Additional Equipment: None  Intra-op Plan:   Post-operative Plan:   Informed Consent: I have reviewed the patients History and Physical, chart, labs and discussed the procedure including the risks, benefits and alternatives for the proposed anesthesia with the patient or authorized representative who has indicated his/her understanding and acceptance.       Plan Discussed with: CRNA  Anesthesia Plan Comments:         Anesthesia Quick Evaluation

## 2019-08-07 ENCOUNTER — Inpatient Hospital Stay (HOSPITAL_COMMUNITY): Payer: BLUE CROSS/BLUE SHIELD

## 2019-08-07 ENCOUNTER — Inpatient Hospital Stay (HOSPITAL_COMMUNITY): Payer: BLUE CROSS/BLUE SHIELD | Admitting: Anesthesiology

## 2019-08-07 ENCOUNTER — Other Ambulatory Visit: Payer: Self-pay

## 2019-08-07 ENCOUNTER — Inpatient Hospital Stay (HOSPITAL_COMMUNITY): Payer: BLUE CROSS/BLUE SHIELD | Admitting: Physician Assistant

## 2019-08-07 ENCOUNTER — Encounter (HOSPITAL_COMMUNITY)
Admission: RE | Disposition: A | Discharge status: Home Health | Payer: Self-pay | Source: Home / Self Care | Attending: Orthopaedic Surgery

## 2019-08-07 ENCOUNTER — Inpatient Hospital Stay (HOSPITAL_COMMUNITY)
Admission: RE | Admit: 2019-08-07 | Discharge: 2019-08-08 | DRG: 470 | Disposition: A | Discharge status: Home Health | Payer: BLUE CROSS/BLUE SHIELD | Attending: Orthopaedic Surgery | Admitting: Orthopaedic Surgery

## 2019-08-07 ENCOUNTER — Encounter (HOSPITAL_COMMUNITY): Payer: Self-pay | Admitting: Orthopaedic Surgery

## 2019-08-07 DIAGNOSIS — E785 Hyperlipidemia, unspecified: Secondary | ICD-10-CM | POA: Diagnosis present

## 2019-08-07 DIAGNOSIS — K219 Gastro-esophageal reflux disease without esophagitis: Secondary | ICD-10-CM | POA: Diagnosis present

## 2019-08-07 DIAGNOSIS — M1611 Unilateral primary osteoarthritis, right hip: Principal | ICD-10-CM | POA: Diagnosis present

## 2019-08-07 DIAGNOSIS — Z20828 Contact with and (suspected) exposure to other viral communicable diseases: Secondary | ICD-10-CM | POA: Diagnosis present

## 2019-08-07 DIAGNOSIS — I10 Essential (primary) hypertension: Secondary | ICD-10-CM | POA: Diagnosis present

## 2019-08-07 DIAGNOSIS — Z419 Encounter for procedure for purposes other than remedying health state, unspecified: Secondary | ICD-10-CM

## 2019-08-07 DIAGNOSIS — Z7982 Long term (current) use of aspirin: Secondary | ICD-10-CM

## 2019-08-07 DIAGNOSIS — D62 Acute posthemorrhagic anemia: Secondary | ICD-10-CM | POA: Diagnosis not present

## 2019-08-07 DIAGNOSIS — Z96641 Presence of right artificial hip joint: Secondary | ICD-10-CM

## 2019-08-07 DIAGNOSIS — Z79899 Other long term (current) drug therapy: Secondary | ICD-10-CM | POA: Diagnosis not present

## 2019-08-07 DIAGNOSIS — E119 Type 2 diabetes mellitus without complications: Secondary | ICD-10-CM | POA: Diagnosis present

## 2019-08-07 HISTORY — PX: TOTAL HIP ARTHROPLASTY: SHX124

## 2019-08-07 LAB — GLUCOSE, CAPILLARY
Glucose-Capillary: 95 mg/dL (ref 70–99)
Glucose-Capillary: 104 mg/dL — ABNORMAL HIGH (ref 70–99)

## 2019-08-07 SURGERY — ARTHROPLASTY, HIP, TOTAL, ANTERIOR APPROACH
Anesthesia: Spinal | Site: Hip | Laterality: Right

## 2019-08-07 MED ORDER — ONDANSETRON HCL 4 MG PO TABS
4.0000 mg | ORAL_TABLET | Freq: Four times a day (QID) | ORAL | Status: DC | PRN
  Administered 2019-08-07: 21:00:00 4 mg via ORAL
  Filled 2019-08-07: qty 1

## 2019-08-07 MED ORDER — OXYCODONE HCL 5 MG PO TABS: 10.0000 mg | ORAL_TABLET | ORAL | Status: DC | PRN

## 2019-08-07 MED ORDER — ALUM & MAG HYDROXIDE-SIMETH 200-200-20 MG/5ML PO SUSP: 30.0000 mL | ORAL | Status: DC | PRN

## 2019-08-07 MED ORDER — METOCLOPRAMIDE HCL 5 MG PO TABS: 5.0000 mg | ORAL_TABLET | Freq: Three times a day (TID) | ORAL | Status: DC | PRN

## 2019-08-07 MED ORDER — IRBESARTAN 75 MG PO TABS
75.0000 mg | ORAL_TABLET | Freq: Every day | ORAL | Status: DC
  Administered 2019-08-08: 11:00:00 75 mg via ORAL
  Filled 2019-08-07: qty 1

## 2019-08-07 MED ORDER — KETOROLAC TROMETHAMINE 15 MG/ML IJ SOLN
7.5000 mg | Freq: Four times a day (QID) | INTRAMUSCULAR | Status: AC
  Administered 2019-08-07 – 2019-08-08 (×4): 7.5 mg via INTRAVENOUS
  Filled 2019-08-07 (×4): qty 1

## 2019-08-07 MED ORDER — FENTANYL CITRATE (PF) 100 MCG/2ML IJ SOLN
INTRAMUSCULAR | Status: AC
  Filled 2019-08-07: qty 2

## 2019-08-07 MED ORDER — MENTHOL 3 MG MT LOZG: 1.0000 | LOZENGE | OROMUCOSAL | Status: DC | PRN

## 2019-08-07 MED ORDER — METHOCARBAMOL 500 MG IVPB - SIMPLE MED
500.0000 mg | Freq: Four times a day (QID) | INTRAVENOUS | Status: DC | PRN
  Filled 2019-08-07: qty 50

## 2019-08-07 MED ORDER — OXYCODONE HCL 5 MG PO TABS: 5.0000 mg | ORAL_TABLET | Freq: Once | ORAL | Status: DC | PRN

## 2019-08-07 MED ORDER — ACETAMINOPHEN 500 MG PO TABS
1000.0000 mg | ORAL_TABLET | Freq: Once | ORAL | Status: AC
  Administered 2019-08-07: 08:00:00 1000 mg via ORAL
  Filled 2019-08-07: qty 2

## 2019-08-07 MED ORDER — METOCLOPRAMIDE HCL 5 MG/ML IJ SOLN: 5.0000 mg | Freq: Three times a day (TID) | INTRAMUSCULAR | Status: DC | PRN

## 2019-08-07 MED ORDER — ONDANSETRON HCL 4 MG/2ML IJ SOLN: 4.0000 mg | Freq: Four times a day (QID) | INTRAMUSCULAR | Status: DC | PRN

## 2019-08-07 MED ORDER — MEPERIDINE HCL 50 MG/ML IJ SOLN: 6.2500 mg | INTRAMUSCULAR | Status: DC | PRN

## 2019-08-07 MED ORDER — VITAMIN D 25 MCG (1000 UNIT) PO TABS: 10000.0000 [IU] | ORAL_TABLET | Freq: Every day | ORAL | Status: DC

## 2019-08-07 MED ORDER — MIDAZOLAM HCL 5 MG/5ML IJ SOLN
INTRAMUSCULAR | Status: DC | PRN
  Administered 2019-08-07: 2 mg via INTRAVENOUS

## 2019-08-07 MED ORDER — METHOCARBAMOL 500 MG PO TABS
500.0000 mg | ORAL_TABLET | Freq: Four times a day (QID) | ORAL | Status: DC | PRN
  Administered 2019-08-07 – 2019-08-08 (×3): 500 mg via ORAL
  Filled 2019-08-07 (×3): qty 1

## 2019-08-07 MED ORDER — SODIUM CHLORIDE 0.9 % IR SOLN
Status: DC | PRN
  Administered 2019-08-07: 1000 mL

## 2019-08-07 MED ORDER — TRANEXAMIC ACID-NACL 1000-0.7 MG/100ML-% IV SOLN
1000.0000 mg | INTRAVENOUS | Status: AC
  Administered 2019-08-07: 1000 mg via INTRAVENOUS
  Filled 2019-08-07: qty 100

## 2019-08-07 MED ORDER — SODIUM CHLORIDE 0.9 % IV SOLN
INTRAVENOUS | Status: DC
  Administered 2019-08-07: 14:00:00 via INTRAVENOUS

## 2019-08-07 MED ORDER — POVIDONE-IODINE 10 % EX SWAB
2.0000 "application " | Freq: Once | CUTANEOUS | Status: AC
  Administered 2019-08-07: 2 via TOPICAL

## 2019-08-07 MED ORDER — KETOROLAC TROMETHAMINE 30 MG/ML IJ SOLN: 30.0000 mg | Freq: Once | INTRAMUSCULAR | Status: DC | PRN

## 2019-08-07 MED ORDER — PHENYLEPHRINE HCL-NACL 10-0.9 MG/250ML-% IV SOLN
INTRAVENOUS | Status: DC | PRN
  Administered 2019-08-07: 25 ug/min via INTRAVENOUS

## 2019-08-07 MED ORDER — PROPOFOL 500 MG/50ML IV EMUL
INTRAVENOUS | Status: DC | PRN
  Administered 2019-08-07: 75 ug/kg/min via INTRAVENOUS

## 2019-08-07 MED ORDER — POLYETHYLENE GLYCOL 3350 17 G PO PACK: 17.0000 g | PACK | Freq: Every day | ORAL | Status: DC | PRN

## 2019-08-07 MED ORDER — VITAMIN B-12 1000 MCG PO TABS
5000.0000 ug | ORAL_TABLET | Freq: Every day | ORAL | Status: DC
  Administered 2019-08-08: 5000 ug via ORAL
  Filled 2019-08-07: qty 5

## 2019-08-07 MED ORDER — CHLORHEXIDINE GLUCONATE 4 % EX LIQD: 60.0000 mL | Freq: Once | CUTANEOUS | Status: DC

## 2019-08-07 MED ORDER — DOCUSATE SODIUM 100 MG PO CAPS
100.0000 mg | ORAL_CAPSULE | Freq: Two times a day (BID) | ORAL | Status: DC
  Administered 2019-08-07 – 2019-08-08 (×2): 100 mg via ORAL
  Filled 2019-08-07 (×2): qty 1

## 2019-08-07 MED ORDER — PROPOFOL 10 MG/ML IV BOLUS
INTRAVENOUS | Status: DC | PRN
  Administered 2019-08-07 (×2): 20 mg via INTRAVENOUS

## 2019-08-07 MED ORDER — HYDROMORPHONE HCL 1 MG/ML IJ SOLN: 0.5000 mg | INTRAMUSCULAR | Status: DC | PRN

## 2019-08-07 MED ORDER — LACTATED RINGERS IV SOLN
INTRAVENOUS | Status: DC
  Administered 2019-08-07 (×2): via INTRAVENOUS

## 2019-08-07 MED ORDER — 0.9 % SODIUM CHLORIDE (POUR BTL) OPTIME
TOPICAL | Status: DC | PRN
  Administered 2019-08-07: 1000 mL

## 2019-08-07 MED ORDER — CEFAZOLIN SODIUM-DEXTROSE 1-4 GM/50ML-% IV SOLN
1.0000 g | Freq: Four times a day (QID) | INTRAVENOUS | Status: AC
  Administered 2019-08-07 (×2): 1 g via INTRAVENOUS
  Filled 2019-08-07 (×2): qty 50

## 2019-08-07 MED ORDER — BUPIVACAINE IN DEXTROSE 0.75-8.25 % IT SOLN
INTRATHECAL | Status: DC | PRN
  Administered 2019-08-07: 1.6 mg via INTRATHECAL

## 2019-08-07 MED ORDER — STERILE WATER FOR IRRIGATION IR SOLN
Status: DC | PRN
  Administered 2019-08-07: 2000 mL

## 2019-08-07 MED ORDER — PHENOL 1.4 % MT LIQD: 1.0000 | OROMUCOSAL | Status: DC | PRN

## 2019-08-07 MED ORDER — GABAPENTIN 100 MG PO CAPS
100.0000 mg | ORAL_CAPSULE | Freq: Three times a day (TID) | ORAL | Status: DC
  Administered 2019-08-07 – 2019-08-08 (×3): 100 mg via ORAL
  Filled 2019-08-07 (×3): qty 1

## 2019-08-07 MED ORDER — PANTOPRAZOLE SODIUM 40 MG PO TBEC
40.0000 mg | DELAYED_RELEASE_TABLET | Freq: Every day | ORAL | Status: DC
  Administered 2019-08-08: 40 mg via ORAL
  Filled 2019-08-07 (×2): qty 1

## 2019-08-07 MED ORDER — ONDANSETRON HCL 4 MG/2ML IJ SOLN
INTRAMUSCULAR | Status: AC
  Filled 2019-08-07: qty 2

## 2019-08-07 MED ORDER — VITAMIN B-12 5000 MCG SL SUBL: 5000.0000 ug | SUBLINGUAL_TABLET | Freq: Every day | SUBLINGUAL | Status: DC

## 2019-08-07 MED ORDER — HYDROMORPHONE HCL 1 MG/ML IJ SOLN: 0.2500 mg | INTRAMUSCULAR | Status: DC | PRN

## 2019-08-07 MED ORDER — FENTANYL CITRATE (PF) 100 MCG/2ML IJ SOLN
INTRAMUSCULAR | Status: DC | PRN
  Administered 2019-08-07: 30 ug via INTRAVENOUS
  Administered 2019-08-07: 100 ug via INTRAVENOUS

## 2019-08-07 MED ORDER — ACETAMINOPHEN 325 MG PO TABS: 325.0000 mg | ORAL_TABLET | Freq: Four times a day (QID) | ORAL | Status: DC | PRN

## 2019-08-07 MED ORDER — DIPHENHYDRAMINE HCL 12.5 MG/5ML PO ELIX: 12.5000 mg | ORAL_SOLUTION | ORAL | Status: DC | PRN

## 2019-08-07 MED ORDER — OXYCODONE HCL 5 MG PO TABS
5.0000 mg | ORAL_TABLET | ORAL | Status: DC | PRN
  Administered 2019-08-07 – 2019-08-08 (×3): 5 mg via ORAL
  Filled 2019-08-07 (×4): qty 1

## 2019-08-07 MED ORDER — PROPOFOL 10 MG/ML IV BOLUS
INTRAVENOUS | Status: AC
  Filled 2019-08-07: qty 60

## 2019-08-07 MED ORDER — OXYCODONE HCL 5 MG/5ML PO SOLN: 5.0000 mg | Freq: Once | ORAL | Status: DC | PRN

## 2019-08-07 MED ORDER — VITAMIN C 500 MG PO TABS
1000.0000 mg | ORAL_TABLET | Freq: Every day | ORAL | Status: DC
  Administered 2019-08-08: 11:00:00 1000 mg via ORAL
  Filled 2019-08-07: qty 2

## 2019-08-07 MED ORDER — PROMETHAZINE HCL 25 MG/ML IJ SOLN: 6.2500 mg | INTRAMUSCULAR | Status: DC | PRN

## 2019-08-07 MED ORDER — ASPIRIN 81 MG PO CHEW
81.0000 mg | CHEWABLE_TABLET | Freq: Two times a day (BID) | ORAL | Status: DC
  Administered 2019-08-07 – 2019-08-08 (×2): 81 mg via ORAL
  Filled 2019-08-07 (×2): qty 1

## 2019-08-07 MED ORDER — CEFAZOLIN SODIUM-DEXTROSE 2-4 GM/100ML-% IV SOLN
2.0000 g | INTRAVENOUS | Status: AC
  Administered 2019-08-07: 2 g via INTRAVENOUS
  Filled 2019-08-07: qty 100

## 2019-08-07 MED ORDER — DEXAMETHASONE SODIUM PHOSPHATE 10 MG/ML IJ SOLN
INTRAMUSCULAR | Status: AC
  Filled 2019-08-07: qty 1

## 2019-08-07 MED ORDER — MIDAZOLAM HCL 2 MG/2ML IJ SOLN
INTRAMUSCULAR | Status: AC
  Filled 2019-08-07: qty 2

## 2019-08-07 MED ORDER — PHENYLEPHRINE HCL (PRESSORS) 10 MG/ML IV SOLN
INTRAVENOUS | Status: AC
  Filled 2019-08-07: qty 1

## 2019-08-07 SURGICAL SUPPLY — 41 items
ARTICULEZE HEAD (Hips) ×2 IMPLANT
BAG ZIPLOCK 12X15 (MISCELLANEOUS) ×1 IMPLANT
BENZOIN TINCTURE PRP APPL 2/3 (GAUZE/BANDAGES/DRESSINGS) IMPLANT
BLADE SAW SGTL 18X1.27X75 (BLADE) ×2 IMPLANT
COVER PERINEAL POST (MISCELLANEOUS) ×2 IMPLANT
COVER SURGICAL LIGHT HANDLE (MISCELLANEOUS) ×2 IMPLANT
COVER WAND RF STERILE (DRAPES) ×2 IMPLANT
DRAPE STERI IOBAN 125X83 (DRAPES) ×2 IMPLANT
DRAPE U-SHAPE 47X51 STRL (DRAPES) ×4 IMPLANT
DRSG AQUACEL AG ADV 3.5X10 (GAUZE/BANDAGES/DRESSINGS) ×2 IMPLANT
DURAPREP 26ML APPLICATOR (WOUND CARE) ×2 IMPLANT
ELECT REM PT RETURN 15FT ADLT (MISCELLANEOUS) ×2 IMPLANT
GAUZE XEROFORM 1X8 LF (GAUZE/BANDAGES/DRESSINGS) ×2 IMPLANT
GLOVE BIO SURGEON STRL SZ7.5 (GLOVE) ×2 IMPLANT
GLOVE BIOGEL PI IND STRL 8 (GLOVE) ×2 IMPLANT
GLOVE BIOGEL PI INDICATOR 8 (GLOVE) ×2
GLOVE ECLIPSE 8.0 STRL XLNG CF (GLOVE) ×2 IMPLANT
GOWN STRL REUS W/TWL XL LVL3 (GOWN DISPOSABLE) ×4 IMPLANT
HANDPIECE INTERPULSE COAX TIP (DISPOSABLE) ×1
HEAD ARTICULEZE (Hips) IMPLANT
HOLDER FOLEY CATH W/STRAP (MISCELLANEOUS) ×2 IMPLANT
KIT TURNOVER KIT A (KITS) IMPLANT
LINER NEUTRAL 52X36MM PLUS 4 (Liner) ×1 IMPLANT
PACK ANTERIOR HIP CUSTOM (KITS) ×2 IMPLANT
PENCIL SMOKE EVACUATOR (MISCELLANEOUS) ×1 IMPLANT
PIN SECTOR W/GRIP ACE CUP 52MM (Hips) ×1 IMPLANT
SET HNDPC FAN SPRY TIP SCT (DISPOSABLE) ×1 IMPLANT
SLEEVE SUCTION 125 (MISCELLANEOUS) ×1 IMPLANT
STAPLER VISISTAT 35W (STAPLE) ×1 IMPLANT
STEM FEM ACTIS HIGH SZ2 (Stem) ×1 IMPLANT
STRIP CLOSURE SKIN 1/2X4 (GAUZE/BANDAGES/DRESSINGS) IMPLANT
SUT ETHIBOND NAB CT1 #1 30IN (SUTURE) ×2 IMPLANT
SUT ETHILON 2 0 PS N (SUTURE) IMPLANT
SUT MNCRL AB 4-0 PS2 18 (SUTURE) IMPLANT
SUT VIC AB 0 CT1 36 (SUTURE) ×2 IMPLANT
SUT VIC AB 1 CT1 36 (SUTURE) ×2 IMPLANT
SUT VIC AB 2-0 CT1 27 (SUTURE) ×2
SUT VIC AB 2-0 CT1 TAPERPNT 27 (SUTURE) ×2 IMPLANT
TRAY FOLEY MTR SLVR 14FR STAT (SET/KITS/TRAYS/PACK) ×1 IMPLANT
TRAY FOLEY MTR SLVR 16FR STAT (SET/KITS/TRAYS/PACK) IMPLANT
YANKAUER SUCT BULB TIP 10FT TU (MISCELLANEOUS) ×2 IMPLANT

## 2019-08-07 NOTE — Transfer of Care (Signed)
Immediate Anesthesia Transfer of Care Note  Patient: Kathy [name]  Procedure(s) Performed: RIGHT TOTAL HIP ARTHROPLASTY ANTERIOR APPROACH (Right Hip)  Patient Location: PACU  Anesthesia Type:MAC and Spinal  Level of Consciousness: awake, alert , oriented and patient cooperative  Airway & Oxygen Therapy: Patient Spontanous Breathing and Patient connected to face mask oxygen  Post-op Assessment: Report given to RN and Post -op Vital signs reviewed and stable  Post vital signs: Reviewed and stable  Last Vitals:  Vitals Value Taken Time  BP 96/51 08/07/19 1107  Temp    Pulse 70 08/07/19 1109  Resp 20 08/07/19 1109  SpO2 100 % 08/07/19 1109  Vitals shown include unvalidated device data.  Last Pain:  Vitals:   08/07/19 0747  TempSrc: Oral         Complications: No apparent anesthesia complications

## 2019-08-07 NOTE — Plan of Care (Signed)
Problem: Education: Goal: Knowledge of General Education information will improve Description: Including pain rating scale, medication(s)/side effects and non-pharmacologic comfort measures 08/07/2019 2303 by Einar Crow, RN Outcome: Progressing 08/07/2019 2302 by Einar Crow, RN Outcome: Progressing   Problem: Clinical Measurements: Goal: Will remain free from infection 08/07/2019 2303 by Einar Crow, RN Outcome: Progressing 08/07/2019 2302 by Einar Crow, RN Outcome: Progressing Goal: Respiratory complications will improve 08/07/2019 2303 by Einar Crow, RN Outcome: Progressing 08/07/2019 2302 by Einar Crow, RN Outcome: Progressing Goal: Cardiovascular complication will be avoided 08/07/2019 2303 by Einar Crow, RN Outcome: Progressing 08/07/2019 2302 by Einar Crow, RN Outcome: Progressing   Problem: Coping: Goal: Level of anxiety will decrease 08/07/2019 2303 by Einar Crow, RN Outcome: Progressing 08/07/2019 2302 by Einar Crow, RN Outcome: Progressing   Problem: Pain Managment: Goal: General experience of comfort will improve 08/07/2019 2303 by Einar Crow, RN Outcome: Progressing 08/07/2019 2302 by Einar Crow, RN Outcome: Progressing   Problem: Safety: Goal: Ability to remain free from injury will improve Outcome: Progressing   Problem: Education: Goal: Knowledge of the prescribed therapeutic regimen will improve 08/07/2019 2303 by Einar Crow, RN Outcome: Progressing 08/07/2019 2302 by Einar Crow, RN Outcome: Progressing Goal: Understanding of discharge needs will improve 08/07/2019 2303 by Einar Crow, RN Outcome: Progressing 08/07/2019 2302 by Einar Crow, RN Outcome: Progressing   Problem: Activity: Goal: Ability to avoid complications of mobility impairment will improve Outcome: Progressing   Problem: Pain Management: Goal: Pain level will decrease with  appropriate interventions Outcome: Progressing

## 2019-08-07 NOTE — Op Note (Signed)
NAME: Kathy, [name] MEDICAL RECORD [id number] ACCOUNT [id number] DATE OF BIRTH:23-Aug-1956 FACILITY: [facility] LOCATION: [id number] PHYSICIAN:Jamile Rekowski Kerry Fort, MD  OPERATIVE REPORT  DATE OF PROCEDURE:  08/07/2019  PREOPERATIVE DIAGNOSIS:  Primary osteoarthritis and degenerative joint disease, right hip.  POSTOPERATIVE DIAGNOSIS:  Primary osteoarthritis and degenerative joint disease, right hip.  PROCEDURE:  Right total hip arthroplasty through direct anterior approach.  IMPLANTS:  DePuy Sector Gription acetabular component size 52, size 36+4 neutral polyethylene liner, size 2 Actis femoral component with high offset, size 36+5 metal hip ball.  SURGEON:  Lind Guest. Ninfa Linden, MD  ASSISTANT:  Erskine Emery, PA-C  ANESTHESIA:  Spinal.  ANTIBIOTICS:  Two grams IV Ancef.  ESTIMATED BLOOD LOSS:  150 mL.  COMPLICATIONS:  None.  INDICATIONS:  The patient is a 63 year old female with debilitating arthritis involving both her hips with the right much worse than the left.  Radiographs of the hip do show severe end-stage arthritis with cystic changes and joint space loss as well as  sclerotic changes and periarticular osteophytes.  Her right hip pain has become daily.  It is detrimentally affecting her mobility, her quality of life and activities of daily living to the point she does wish to proceed with total hip arthroplasty on  the right side.  We had a long and thorough discussion about the risk of acute blood loss anemia, nerve or vessel injury, fracture, infection, DVT, dislocation, implant failure.  We talked about the goals being decreased pain, improve mobility and  overall improve quality of life.  DESCRIPTION OF PROCEDURE:  After informed consent was obtained and appropriate right hip was marked, she was brought to the operating room and sat up on a stretcher where spinal anesthesia was obtained.  She was then laid in supine position.  A Foley  catheter was placed and I was  able to get a good assessment of her leg lengths.  Clinically, she is definitely shorter on the right side than the left.  Radiographically on standing films in the office, she looks a little bit shorter on the right and  left as well.  Traction boots were placed on both her feet and she was placed supine on the Hana fracture table, the perineal post in place and both legs in line skeletal traction device and no traction applied.  Her right operative hip was prepped and  draped with DuraPrep and sterile drapes.  A timeout was called to identify correct patient and correct right hip.  We then made an incision just inferior and posterior to the anterior superior iliac spine and carried this obliquely down the leg.  We  dissected down tensor fascia lata muscle.  Tensor fascia was then divided longitudinally to proceed with direct anterior approach to the hip.  We identified and cauterized circumflex vessels.  I then identified the hip capsule.  I entered the hip capsule  in an L-type format, finding moderate joint effusion and significant periarticular osteophytes around the femoral head and neck.  We then made our femoral neck cut with an oscillating saw just proximal to the lesser trochanter and completed this with an  osteotome.  We placed a corkscrew guide in the femoral head and removed the femoral head in its entirety and found a wide area devoid of cartilage.  I then placed a bent Hohmann over the medial acetabular rim and removed remnants of the acetabular  labrum and other debris.  We then began reaming under direct visualization from a size 44 reamer in stepwise  increments up to a size 51 with all reamers under direct visualization, the last reamer was placed under direct fluoroscopy, so I could obtain my  depth of reaming by inclination and anteversion.  I then placed the real DePuy Sector Gription acetabular component size 52 and a 36+4 neutral polyethylene liner due to her significant offset.   Attention was then turned to the femur.  With the leg  externally rotated to 120 degrees, extended and adducted, I was able to place a Mueller retractor medially and a Hohmann retractor above the greater trochanter.  I released lateral joint capsule and used a box-cutting osteotome to enter the femoral canal  and a rongeur to lateralize then began broaching using the Actis broaching system from a size zero up to a size 2.  She has a very tight canal and the size 2 had a good fit.  A trialed a high offset femoral neck and a 36-2 hip ball, reduced this in the  acetabulum and it was stable but we definitely needed more leg length.  I dislocated the hip and removed the trial components.  I then placed the real Actis femoral component with high offset size 2 and went with a 36+5 metal hip ball, reduced this in  the acetabulum and I was pleased with stability, range of motion and leg length assessed radiographically and mechanically.  I then irrigated the soft tissue with normal saline solution using pulsatile lavage.  We closed joint capsule with interrupted #1  Ethibond suture.  Number one Vicryl was used to close the tensor fascia, 0 Vicryl was used to close deep tissue, 2-0 Vicryl was used to close subcutaneous tissue and interrupted staples were placed on the skin incision.  Xeroform and Aquacel dressing  was applied.  She was taken to recovery room in stable condition.  All final counts were correct.  There were no complications noted.  Of note, Benita Stabile, PA-C, assisted in the entire case.  His assistance was crucial for facilitating all aspects of this  case.  TN/NUANCE  D:08/07/2019 T:08/07/2019 JOB:[id number]

## 2019-08-07 NOTE — H&P (Signed)
The patient understands fully that she is here for a right total hip arthroplasty today.  There has been no acute change in her medical status.  See recent H&P on the chart.  She understands fully the risk and benefits of the surgery.  All question concerns have been answered and addressed.  Informed consent is obtained and the right hip was marked.

## 2019-08-07 NOTE — Anesthesia Postprocedure Evaluation (Signed)
Anesthesia Post Note  Patient: Kathy [name]  Procedure(s) Performed: RIGHT TOTAL HIP ARTHROPLASTY ANTERIOR APPROACH (Right Hip)     Patient location during evaluation: PACU Anesthesia Type: Spinal and MAC Level of consciousness: awake and alert Pain management: pain level controlled Vital Signs Assessment: post-procedure vital signs reviewed and stable Respiratory status: spontaneous breathing, nonlabored ventilation, respiratory function stable and patient connected to nasal cannula oxygen Cardiovascular status: blood pressure returned to baseline and stable Postop Assessment: no apparent nausea or vomiting, patient able to bend at knees, no backache, no headache and spinal receding Anesthetic complications: no    Last Vitals:  Vitals:   08/07/19 1315 08/07/19 1340  BP: 115/69 130/77  Pulse: (!) 59 76  Resp: 16 15  Temp: 36.6 C (!) 36.3 C  SpO2: 100% 100%    Last Pain:  Vitals:   08/07/19 1340  TempSrc: Oral  PainSc: 0-No pain                 Pervis Hocking

## 2019-08-07 NOTE — Anesthesia Procedure Notes (Signed)
Procedure Name: MAC Date/Time: 08/07/2019 9:35 AM Performed by: Lissa Morales, CRNA Pre-anesthesia Checklist: Patient identified, Emergency Drugs available, Suction available, Patient being monitored and Timeout performed Patient Re-evaluated:Patient Re-evaluated prior to induction Oxygen Delivery Method: Simple face mask Placement Confirmation: positive ETCO2

## 2019-08-07 NOTE — Evaluation (Signed)
Physical Therapy Evaluation Patient Details Name: Kathy [name] MRN: [id number] DOB: [birth year] Today's Date: 08/07/2019   History of Present Illness  Pt s/p R THR and with hx of DM  Clinical Impression  Pt s/p R THR and presents with decreased R LE strength/ROM and post op pain limiting functional mobility.  Pt should progress to dc home with family assist.    Follow Up Recommendations Follow surgeon's recommendation for DC plan and follow-up therapies    Equipment Recommendations  None recommended by PT    Recommendations for Other Services       Precautions / Restrictions Precautions Precautions: Fall Restrictions Weight Bearing Restrictions: No Other Position/Activity Restrictions: WBAT      Mobility  Bed Mobility Overal bed mobility: Needs Assistance Bed Mobility: Supine to Sit     Supine to sit: Min assist     General bed mobility comments: cues for sequence and use of L LE to self assist  Transfers Overall transfer level: Needs assistance Equipment used: Rolling walker (2 wheeled) Transfers: Sit to/from Stand Sit to Stand: Min assist         General transfer comment: cues for LE management and use of UEs to self assist  Ambulation/Gait Ambulation/Gait assistance: Min assist;Min guard Gait Distance (Feet): 100 Feet Assistive device: Rolling walker (2 wheeled) Gait Pattern/deviations: Step-to pattern;Decreased step length - right;Decreased step length - left;Shuffle;Trunk flexed;Antalgic Gait velocity: decr   General Gait Details: cues for posture, position from RW and initial sequence  Stairs            Wheelchair Mobility    Modified Rankin (Stroke Patients Only)       Balance Overall balance assessment: Mild deficits observed, not formally tested                                           Pertinent Vitals/Pain Pain Assessment: 0-10 Pain Score: 2  Pain Location: R hip Pain Descriptors / Indicators: Sore Pain  Intervention(s): Limited activity within patient's tolerance;Monitored during session;Premedicated before session;Ice applied    Home Living Family/patient expects to be discharged to:: Private residence Living Arrangements: Children;Other relatives Available Help at Discharge: Family Type of Home: House Home Access: Stairs to enter Entrance Stairs-Rails: Left Entrance Stairs-Number of Steps: 4 Home Layout: Able to live on main level with bedroom/bathroom Home Equipment: Walker - 2 wheels;Bedside commode Additional Comments: Pt states she is borrowing RW    Prior Function Level of Independence: Independent               Hand Dominance        Extremity/Trunk Assessment   Upper Extremity Assessment Upper Extremity Assessment: Overall WFL for tasks assessed    Lower Extremity Assessment Lower Extremity Assessment: RLE deficits/detail    Cervical / Trunk Assessment Cervical / Trunk Assessment: Normal  Communication   Communication: No difficulties  Cognition Arousal/Alertness: Awake/alert Behavior During Therapy: WFL for tasks assessed/performed Overall Cognitive Status: Within Functional Limits for tasks assessed                                        General Comments      Exercises Total Joint Exercises Ankle Circles/Pumps: AROM;Both;15 reps;Supine   Assessment/Plan    PT Assessment Patient needs continued PT services  PT Problem List  Decreased strength;Decreased range of motion;Decreased activity tolerance;Decreased mobility;Decreased knowledge of use of DME;Pain       PT Treatment Interventions DME instruction;Gait training;Stair training;Functional mobility training;Therapeutic activities;Therapeutic exercise;Patient/family education    PT Goals (Current goals can be found in the Care Plan section)  Acute Rehab PT Goals Patient Stated Goal: Regain IND PT Goal Formulation: With patient Time For Goal Achievement: 08/14/19 Potential to  Achieve Goals: Good    Frequency 7X/week   Barriers to discharge        Co-evaluation               AM-PAC PT "6 Clicks" Mobility  Outcome Measure Help needed turning from your back to your side while in a flat bed without using bedrails?: A Little Help needed moving from lying on your back to sitting on the side of a flat bed without using bedrails?: A Little Help needed moving to and from a bed to a chair (including a wheelchair)?: A Little Help needed standing up from a chair using your arms (e.g., wheelchair or bedside chair)?: A Little Help needed to walk in hospital room?: A Little Help needed climbing 3-5 steps with a railing? : A Little 6 Click Score: 18    End of Session Equipment Utilized During Treatment: Gait belt Activity Tolerance: Patient tolerated treatment well Patient left: with call bell/phone within reach;in bed;with bed alarm set Nurse Communication: Mobility status PT Visit Diagnosis: Difficulty in walking, not elsewhere classified (R26.2)    Time: [id number] PT Time Calculation (min) (ACUTE ONLY): 32 min   Charges:   PT Evaluation $PT Eval Low Complexity: 1 Low PT Treatments $Gait Training: 8-22 mins        [place] Pager [phone] Office [phone]   Jovontae Banko 08/07/2019, 5:48 PM

## 2019-08-07 NOTE — Anesthesia Procedure Notes (Signed)
Spinal  Patient location during procedure: OR Start time: 08/07/2019 9:35 AM End time: 08/07/2019 9:45 AM Staffing Performed: anesthesiologist  Anesthesiologist: Pervis Hocking, DO Preanesthetic Checklist Completed: patient identified, IV checked, risks and benefits discussed, surgical consent, monitors and equipment checked, pre-op evaluation and timeout performed Spinal Block Patient position: sitting Prep: DuraPrep and site prepped and draped Patient monitoring: cardiac monitor, continuous pulse ox and blood pressure Approach: midline Location: L3-4 Injection technique: single-shot Needle Needle type: Quincke  Needle gauge: 22 G Needle length: 9 cm Assessment Sensory level: T6 Additional Notes Functioning IV was confirmed and monitors were applied. Sterile prep and drape, including hand hygiene and sterile gloves were used. The patient was positioned and the spine was prepped. The skin was anesthetized with lidocaine.  Free flow of clear CSF was obtained prior to injecting local anesthetic into the CSF.  The spinal needle aspirated freely following injection.  The needle was carefully withdrawn.  The patient tolerated the procedure well.

## 2019-08-07 NOTE — Brief Op Note (Signed)
08/07/2019  10:50 AM  PATIENT:  Kathy [name]  63 y.o. female  PRE-OPERATIVE DIAGNOSIS:  osteoarthritis right hip  POST-OPERATIVE DIAGNOSIS:  osteoarthritis right hip  PROCEDURE:  Procedure(s): RIGHT TOTAL HIP ARTHROPLASTY ANTERIOR APPROACH (Right)  SURGEON:  Surgeon(s) and Role:    Mcarthur Rossetti, MD - Primary  PHYSICIAN ASSISTANT:  Benita Stabile, PA-C   ANESTHESIA:   spinal  EBL:  150 mL   COUNTS:  YES  DICTATION: .Other Dictation: Dictation Number [id number]  PLAN OF CARE: Admit for overnight observation  PATIENT DISPOSITION:  PACU - hemodynamically stable.   Delay start of Pharmacological VTE agent (>24hrs) due to surgical blood loss or risk of bleeding: no

## 2019-08-08 LAB — BASIC METABOLIC PANEL
Anion gap: 8 (ref 5–15)
BUN: 18 mg/dL (ref 8–23)
CO2: 24 mmol/L (ref 22–32)
Calcium: 8.6 mg/dL — ABNORMAL LOW (ref 8.9–10.3)
Chloride: 107 mmol/L (ref 98–111)
Creatinine, Ser: 0.7 mg/dL (ref 0.44–1.00)
GFR calc Af Amer: >60 mL/min (ref >60)
GFR calc non Af Amer: >60 mL/min (ref >60)
Glucose, Bld: 160 mg/dL — ABNORMAL HIGH (ref 70–99)
Potassium: 3.8 mmol/L (ref 3.5–5.1)
Sodium: 139 mmol/L (ref 135–145)

## 2019-08-08 LAB — CBC
HCT: 31.3 % — ABNORMAL LOW (ref 36.0–46.0)
Hemoglobin: 9.9 g/dL — ABNORMAL LOW (ref 12.0–15.0)
MCH: 28.9 pg (ref 26.0–34.0)
MCHC: 31.6 g/dL (ref 30.0–36.0)
MCV: 91.3 fL (ref 80.0–100.0)
Platelets: 258 10*3/uL (ref 150–400)
RBC: 3.43 MIL/uL — ABNORMAL LOW (ref 3.87–5.11)
RDW: 12.5 % (ref 11.5–15.5)
WBC: 8.2 10*3/uL (ref 4.0–10.5)
nRBC: 0 % (ref 0.0–0.2)

## 2019-08-08 MED ORDER — ASPIRIN 81 MG PO CHEW: 81.0000 mg | CHEWABLE_TABLET | Freq: Two times a day (BID) | ORAL | 0 refills | Status: DC

## 2019-08-08 MED ORDER — METHOCARBAMOL 500 MG PO TABS: 500.0000 mg | ORAL_TABLET | Freq: Four times a day (QID) | ORAL | 1 refills | Status: DC | PRN

## 2019-08-08 MED ORDER — OXYCODONE HCL 5 MG PO TABS: 5.0000 mg | ORAL_TABLET | ORAL | 0 refills | Status: DC | PRN

## 2019-08-08 NOTE — Progress Notes (Signed)
Subjective: 1 Day Post-Op Procedure(s) (LRB): RIGHT TOTAL HIP ARTHROPLASTY ANTERIOR APPROACH (Right) Patient reports pain as moderate.  Asymptomatic acute blood loss anemia from her surgery.  Objective: Vital signs in last 24 hours: Temp:  [97.4 F (36.3 C)-98.7 F (37.1 C)] 98 F (36.7 C) (12/12 0629) Pulse Rate:  [53-79] 63 (12/12 0629) Resp:  [12-24] 16 (12/12 0629) BP: (96-147)/(51-105) 106/67 (12/12 0629) SpO2:  [96 %-100 %] 100 % (12/12 0629) Weight:  [82.2 kg] 82.2 kg (12/11 1334)  Intake/Output from previous day: 12/11 0701 - 12/12 0700 In: 3147.5 [P.O.:720; I.V.:2127.5; IV Piggyback:300] Out: 3450 [Urine:3300; Blood:150] Intake/Output this shift: Total I/O In: 240 [P.O.:240] Out: 50 [Urine:50]  Recent Labs    08/05/19 1048 08/08/19 0226  HGB 11.7* 9.9*   Recent Labs    08/05/19 1048 08/08/19 0226  WBC 6.2 8.2  RBC 4.02 3.43*  HCT 36.3 31.3*  PLT 325 258   Recent Labs    08/05/19 1048 08/08/19 0226  NA 139 139  K 4.0 3.8  CL 102 107  CO2 27 24  BUN 19 18  CREATININE 0.95 0.70  GLUCOSE 118* 160*  CALCIUM 9.8 8.6*   No results for input(s): LABPT, INR in the last 72 hours.  Sensation intact distally Intact pulses distally Dorsiflexion/Plantar flexion intact Incision: dressing C/D/I   Assessment/Plan: 1 Day Post-Op Procedure(s) (LRB): RIGHT TOTAL HIP ARTHROPLASTY ANTERIOR APPROACH (Right) Up with therapy Discharge home with home health this afternoon.    Patient's anticipated LOS is less than 2 midnights, meeting these requirements: - Younger than 71 - Lives within 1 hour of care - Has a competent adult at home to recover with post-op recover - NO history of  - Chronic pain requiring opiods  - Diabetes  - Coronary Artery Disease  - Heart failure  - Heart attack  - Stroke  - DVT/VTE  - Cardiac arrhythmia  - Respiratory Failure/COPD  - Renal failure  - Anemia  - Advanced Liver disease       Mcarthur Rossetti 08/08/2019, 9:56 AM

## 2019-08-08 NOTE — Progress Notes (Signed)
Physical Therapy Treatment Patient Details Name: Kathy [name] MRN: [id number] DOB: [birth year] Today's Date: 08/08/2019    History of Present Illness Pt s/p R THR and with hx of DM    PT Comments    Pt continues to progress well with mobility.  Pt ambulated increased distance in hall, negotiated stairs and reviewed stairs and home therex program written instructions.   Follow Up Recommendations  Follow surgeon's recommendation for DC plan and follow-up therapies     Equipment Recommendations  None recommended by PT    Recommendations for Other Services       Precautions / Restrictions Precautions Precautions: Fall Restrictions Weight Bearing Restrictions: No Other Position/Activity Restrictions: WBAT    Mobility  Bed Mobility               General bed mobility comments: Pt up in chair and requests back to same  Transfers Overall transfer level: Needs assistance Equipment used: Rolling walker (2 wheeled) Transfers: Sit to/from Stand Sit to Stand: Min guard;Supervision         General transfer comment: min cues for LE management and use of UEs to self assist  Ambulation/Gait Ambulation/Gait assistance: Min guard;Supervision Gait Distance (Feet): 350 Feet Assistive device: Rolling walker (2 wheeled) Gait Pattern/deviations: Decreased step length - right;Decreased step length - left;Shuffle;Trunk flexed;Antalgic;Step-to pattern;Step-through pattern Gait velocity: decr   General Gait Details: cues for posture, position from RW and initial sequence   Stairs Stairs: Yes Stairs assistance: Min assist Stair Management: One rail Left;Step to pattern;Forwards;With cane Number of Stairs: 4 General stair comments: 2 steps twice with rail, cane and cues for sequence and foot/cane placement   Wheelchair Mobility    Modified Rankin (Stroke Patients Only)       Balance Overall balance assessment: Mild deficits observed, not formally tested                                           Cognition Arousal/Alertness: Awake/alert Behavior During Therapy: WFL for tasks assessed/performed Overall Cognitive Status: Within Functional Limits for tasks assessed                                        Exercises Total Joint Exercises Ankle Circles/Pumps: AROM;Both;15 reps;Supine Quad Sets: AROM;Both;10 reps;Supine Heel Slides: AAROM;Right;20 reps;Supine Hip ABduction/ADduction: AAROM;Right;15 reps;Supine Long Arc Quad: AROM;Right;10 reps;Seated    General Comments        Pertinent Vitals/Pain Pain Assessment: 0-10 Pain Score: 3  Pain Location: R hip Pain Descriptors / Indicators: Sore Pain Intervention(s): Limited activity within patient's tolerance;Monitored during session;Premedicated before session;Ice applied    Home Living                      Prior Function            PT Goals (current goals can now be found in the care plan section) Acute Rehab PT Goals Patient Stated Goal: Regain IND PT Goal Formulation: With patient Time For Goal Achievement: 08/14/19 Potential to Achieve Goals: Good Progress towards PT goals: Progressing toward goals    Frequency    7X/week      PT Plan Current plan remains appropriate    Co-evaluation              AM-PAC PT "6  Clicks" Mobility   Outcome Measure  Help needed turning from your back to your side while in a flat bed without using bedrails?: A Little Help needed moving from lying on your back to sitting on the side of a flat bed without using bedrails?: A Little Help needed moving to and from a bed to a chair (including a wheelchair)?: A Little Help needed standing up from a chair using your arms (e.g., wheelchair or bedside chair)?: A Little Help needed to walk in hospital room?: A Little Help needed climbing 3-5 steps with a railing? : A Little 6 Click Score: 18    End of Session Equipment Utilized During Treatment: Gait  belt Activity Tolerance: Patient tolerated treatment well Patient left: in chair;with call bell/phone within reach;with chair alarm set Nurse Communication: Mobility status PT Visit Diagnosis: Difficulty in walking, not elsewhere classified (R26.2)     Time: [id number] PT Time Calculation (min) (ACUTE ONLY): 29 min  Charges:  $Gait Training: 8-22 mins $Therapeutic Exercise: 8-22 mins $Therapeutic Activity: 8-22 mins                     Debe Coder PT Acute Rehabilitation Services Pager [phone] Office [phone]    Kathy [name] 08/08/2019, 12:43 PM

## 2019-08-08 NOTE — Discharge Instructions (Signed)

## 2019-08-08 NOTE — TOC Progression Note (Signed)
Transition of Care [facility]) - Progression Note    Patient Details  Name: Kathy [name] MRN: [id number] Date of Birth: 1956-03-09  Transition of Care [facility]) CM/SW Contact  Joaquin Courts, RN Phone Number: 08/08/2019, 11:52 AM  Clinical Narrative:    CM spoke with patient at bedside. Patient set up with [facility] for [place]. Reports has rolling walker and 3-in-1 at home.   Expected Discharge Plan: [place] Barriers to Discharge: No Barriers Identified  Expected Discharge Plan and Services Expected Discharge Plan: [place]   Discharge Planning Services: CM Consult Post Acute Care Choice: [place] arrangements for the past 2 months: Single Family Home Expected Discharge Date: 08/08/19               DME Arranged: N/A DME Agency: NA       HH Arranged: PT HH Agency: [facility] (formerly [employer]) Date [place]: 08/08/19 Time [place]: 1152 Representative spoke with at [place]: pre arranged at MD office   Social Determinants of Health ([place]) Interventions    Readmission Risk Interventions No flowsheet data found.

## 2019-08-08 NOTE — Discharge Summary (Signed)
Patient ID: Kathy [name] MRN: [id number] DOB/AGE: 1956-02-01 63 y.o.  Admit date: 08/07/2019 Discharge date: 08/08/2019  Admission Diagnoses:  Principal Problem:   Unilateral primary osteoarthritis, right hip Active Problems:   Status post total replacement of right hip   History of total right hip arthroplasty   Discharge Diagnoses:  Same  Past Medical History:  Diagnosis Date  . Arthritis    mild  . Diabetes mellitus without complication (HCC)    Type II,per PCP Dr. Derrel Nip after visit sumary  . GERD (gastroesophageal reflux disease)    occasional  . Hypertension   . Wears contact lenses     Surgeries: Procedure(s): RIGHT TOTAL HIP ARTHROPLASTY ANTERIOR APPROACH on 08/07/2019   Consultants:   Discharged Condition: Improved  Hospital Course: Kathy [name] is an 63 y.o. female who was admitted 08/07/2019 for operative treatment ofUnilateral primary osteoarthritis, right hip. Patient has severe unremitting pain that affects sleep, daily activities, and work/hobbies. After pre-op clearance the patient was taken to the operating room on 08/07/2019 and underwent  Procedure(s): RIGHT TOTAL HIP ARTHROPLASTY ANTERIOR APPROACH.    Patient was given perioperative antibiotics:  Anti-infectives (From admission, onward)   Start     Dose/Rate Route Frequency Ordered Stop   08/07/19 1530  ceFAZolin (ANCEF) IVPB 1 g/50 mL premix     1 g 100 mL/hr over 30 Minutes Intravenous Every 6 hours 08/07/19 1334 08/07/19 2157   08/07/19 0730  ceFAZolin (ANCEF) IVPB 2g/100 mL premix     2 g 200 mL/hr over 30 Minutes Intravenous On call to O.R. 08/07/19 [id number] 08/07/19 [id number]       Patient was given sequential compression devices, early ambulation, and chemoprophylaxis to prevent DVT.  Patient benefited maximally from hospital stay and there were no complications.    Recent vital signs:  Patient Vitals for the past 24 hrs:  BP Temp Temp src Pulse Resp SpO2 Height Weight  08/08/19 0629  106/67 98 F (36.7 C) Oral 63 16 100 % - -  08/08/19 0202 111/65 98.1 F (36.7 C) Oral 67 18 99 % - -  08/08/19 0201 111/65 98.1 F (36.7 C) Oral 68 18 99 % - -  08/07/19 2107 118/74 98 F (36.7 C) Oral 68 16 98 % - -  08/07/19 1751 127/82 97.9 F (36.6 C) Oral 79 14 99 % - -  08/07/19 1628 (!) 143/75 (!) 97.4 F (36.3 C) Oral 79 15 - - -  08/07/19 1527 (!) 147/74 97.9 F (36.6 C) Oral 69 14 100 % - -  08/07/19 1340 130/77 (!) 97.4 F (36.3 C) Oral 76 15 100 % - -  08/07/19 1334 - - - - - - 5\' 6"  (1.676 m) 82.2 kg  08/07/19 1315 115/69 97.8 F (36.6 C) - (!) 59 16 100 % - -  08/07/19 1300 108/62 - - 70 (!) 24 100 % - -  08/07/19 1245 120/71 - - (!) 58 12 100 % - -  08/07/19 1230 121/66 - - (!) 53 13 100 % - -  08/07/19 1215 (!) 120/105 - - (!) 59 19 100 % - -  08/07/19 1200 116/63 - - 62 14 97 % - -  08/07/19 1145 110/62 - - 63 16 97 % - -  08/07/19 1130 (!) 101/59 - - 63 13 96 % - -  08/07/19 1115 (!) 100/57 - - 63 16 100 % - -  08/07/19 1107 (!) 96/51 98.7 F (37.1 C) - 64 16 100 % - -  Recent laboratory studies:  Recent Labs    08/05/19 1048 08/08/19 0226  WBC 6.2 8.2  HGB 11.7* 9.9*  HCT 36.3 31.3*  PLT 325 258  NA 139 139  K 4.0 3.8  CL 102 107  CO2 27 24  BUN 19 18  CREATININE 0.95 0.70  GLUCOSE 118* 160*  CALCIUM 9.8 8.6*     Discharge Medications:   Allergies as of 08/08/2019      Reactions   Neosporin [neomycin-bacitracin Zn-polymyx] Rash      Medication List    STOP taking these medications   aspirin 81 MG tablet Replaced by: aspirin 81 MG chewable tablet     TAKE these medications   acetaminophen 500 MG tablet Commonly known as: TYLENOL Take 500-1,000 mg by mouth every 6 (six) hours as needed (pain.).   ADULT ONE DAILY GUMMIES PO Take 2 tablets by mouth daily. Alive Women 50+   aspirin 81 MG chewable tablet Chew 1 tablet (81 mg total) by mouth 2 (two) times daily. Replaces: aspirin 81 MG tablet   Krill Oil 500 MG Caps Take 500  mg by mouth daily.   MAGNESIUM GLUCONATE PO Take 800 mg by mouth daily.   methocarbamol 500 MG tablet Commonly known as: ROBAXIN Take 1 tablet (500 mg total) by mouth every 6 (six) hours as needed for muscle spasms.   naproxen sodium 220 MG tablet Commonly known as: ALEVE Take 220 mg by mouth daily.   oxyCODONE 5 MG immediate release tablet Commonly known as: Oxy IR/ROXICODONE Take 1-2 tablets (5-10 mg total) by mouth every 4 (four) hours as needed for moderate pain (pain score 4-6).   Red Yeast Rice Extract 600 MG Caps Take 1 capsule (600 mg total) by mouth 2 (two) times daily at 10 AM and 5 PM. What changed:   how much to take  when to take this   SM Cranberry 300 MG tablet Generic drug: Cranberry Take 300 mg by mouth daily.   telmisartan 20 MG tablet Commonly known as: MICARDIS Take 1 tablet (20 mg total) by mouth at bedtime.   Vitamin B-12 5000 MCG Subl Take 5,000 mcg by mouth daily.   vitamin C 1000 MG tablet Take 1,000 mg by mouth daily.   Vitamin D3 250 MCG (10000 UT) capsule Take 10,000 Units by mouth daily. Reported on 12/27/2015            Durable Medical Equipment  (From admission, onward)         Start     Ordered   08/07/19 1335  DME 3 n 1  Once     08/07/19 1334   08/07/19 1335  DME Walker rolling  Once    Question:  Patient needs a walker to treat with the following condition  Answer:  Status post total replacement of right hip   08/07/19 1334          Diagnostic Studies: DG Pelvis Portable  Result Date: 08/07/2019 CLINICAL DATA:  Right hip replacement EXAM: PORTABLE PELVIS 1-2 VIEWS COMPARISON:  Earlier same day FINDINGS: Total hip arthroplasty on the right. Components appear well positioned. No evidence complication. Chronic degenerative changes affect the left hip. IMPRESSION: Good appearance following total hip arthroplasty on the right. Electronically Signed   By: Nelson Chimes M.D.   On: 08/07/2019 12:27   DG C-Arm 1-60 Min-No  Report  Result Date: 08/07/2019 Fluoroscopy was utilized by the requesting physician.  No radiographic interpretation.   MM 3D SCREEN BREAST BILATERAL  Result Date: 07/16/2019 CLINICAL DATA:  Screening. EXAM: DIGITAL SCREENING BILATERAL MAMMOGRAM WITH TOMO AND CAD COMPARISON:  Previous exam(s). ACR Breast Density Category b: There are scattered areas of fibroglandular density. FINDINGS: There are no findings suspicious for malignancy. Images were processed with CAD. IMPRESSION: No mammographic evidence of malignancy. A result letter of this screening mammogram will be mailed directly to the patient. RECOMMENDATION: Screening mammogram in one year. (Code:[id number]-B-01Y) BI-RADS CATEGORY  1: Negative. Electronically Signed   By: Lajean Manes M.D.   On: 07/16/2019 16:24   DG HIP OPERATIVE UNILAT W OR W/O PELVIS RIGHT  Result Date: 08/07/2019 CLINICAL DATA:  Right hip replacement EXAM: OPERATIVE RIGHT HIP (WITH PELVIS IF PERFORMED) 2 VIEWS TECHNIQUE: Fluoroscopic spot image(s) were submitted for interpretation post-operatively. COMPARISON:  None. FINDINGS: Multiple intraoperative fluoroscopic spot images are provided. Interval right total hip arthroplasty. Moderate osteoarthritis of the left hip. FLUOROSCOPY TIME:  15 seconds IMPRESSION: Intraoperative localization. Electronically Signed   By: Kathreen Devoid   On: 08/07/2019 10:59    Disposition: Discharge disposition: 01-Home or [place]    Mcarthur Rossetti, MD Follow up in 2 week(s).   Specialty: Orthopedic Surgery Contact information: [address] [place] [address] [phone]            Signed: Mcarthur Rossetti 08/08/2019, 10:02 AM

## 2019-08-08 NOTE — Progress Notes (Signed)
Physical Therapy Treatment Patient Details Name: Kathy [name] MRN: [id number] DOB: 12-19-1955 Today's Date: 08/08/2019    History of Present Illness Pt s/p R THR and with hx of DM    PT Comments    Pt progressing well with mobility and performed therex program with assist.   Follow Up Recommendations  Follow surgeon's recommendation for DC plan and follow-up therapies     Equipment Recommendations  None recommended by PT    Recommendations for Other Services       Precautions / Restrictions Precautions Precautions: Fall Restrictions Weight Bearing Restrictions: No Other Position/Activity Restrictions: WBAT    Mobility  Bed Mobility               General bed mobility comments: Pt up in chair and requests back to same  Transfers Overall transfer level: Needs assistance Equipment used: Rolling walker (2 wheeled) Transfers: Sit to/from Stand Sit to Stand: Min guard         General transfer comment: cues for LE management and use of UEs to self assist  Ambulation/Gait Ambulation/Gait assistance: Min guard Gait Distance (Feet): 240 Feet(and 15' back from bathroom) Assistive device: Rolling walker (2 wheeled) Gait Pattern/deviations: Decreased step length - right;Decreased step length - left;Shuffle;Trunk flexed;Antalgic;Step-to pattern;Step-through pattern Gait velocity: decr   General Gait Details: cues for posture, position from RW and initial sequence   Stairs             Wheelchair Mobility    Modified Rankin (Stroke Patients Only)       Balance Overall balance assessment: Mild deficits observed, not formally tested                                          Cognition Arousal/Alertness: Awake/alert Behavior During Therapy: WFL for tasks assessed/performed Overall Cognitive Status: Within Functional Limits for tasks assessed                                        Exercises Total Joint  Exercises Ankle Circles/Pumps: AROM;Both;15 reps;Supine Quad Sets: AROM;Both;10 reps;Supine Heel Slides: AAROM;Right;20 reps;Supine Hip ABduction/ADduction: AAROM;Right;15 reps;Supine Long Arc Quad: AROM;Right;10 reps;Seated    General Comments        Pertinent Vitals/Pain Pain Assessment: 0-10 Pain Score: 3  Pain Location: R hip Pain Descriptors / Indicators: Sore Pain Intervention(s): Limited activity within patient's tolerance;Monitored during session;Premedicated before session;Ice applied    Home Living                      Prior Function            PT Goals (current goals can now be found in the care plan section) Acute Rehab PT Goals Patient Stated Goal: Regain IND PT Goal Formulation: With patient Time For Goal Achievement: 08/14/19 Potential to Achieve Goals: Good Progress towards PT goals: Progressing toward goals    Frequency    7X/week      PT Plan Current plan remains appropriate    Co-evaluation              AM-PAC PT "6 Clicks" Mobility   Outcome Measure  Help needed turning from your back to your side while in a flat bed without using bedrails?: A Little Help needed moving from lying on your back to  sitting on the side of a flat bed without using bedrails?: A Little Help needed moving to and from a bed to a chair (including a wheelchair)?: A Little Help needed standing up from a chair using your arms (e.g., wheelchair or bedside chair)?: A Little Help needed to walk in hospital room?: A Little Help needed climbing 3-5 steps with a railing? : A Little 6 Click Score: 18    End of Session Equipment Utilized During Treatment: Gait belt Activity Tolerance: Patient tolerated treatment well Patient left: in chair;with call bell/phone within reach;with chair alarm set Nurse Communication: Mobility status PT Visit Diagnosis: Difficulty in walking, not elsewhere classified (R26.2)     Time: [id number] PT Time Calculation (min)  (ACUTE ONLY): 34 min  Charges:  $Gait Training: 8-22 mins $Therapeutic Exercise: 8-22 mins                     [place] Pager [phone] Office [phone]    Witney Huie 08/08/2019, 12:39 PM

## 2019-08-10 ENCOUNTER — Encounter: Payer: Self-pay | Admitting: *Deleted

## 2019-08-19 ENCOUNTER — Encounter: Payer: Self-pay | Admitting: Orthopaedic Surgery

## 2019-08-19 ENCOUNTER — Telehealth: Payer: Self-pay | Admitting: Orthopaedic Surgery

## 2019-08-19 ENCOUNTER — Other Ambulatory Visit: Payer: Self-pay

## 2019-08-19 ENCOUNTER — Ambulatory Visit (INDEPENDENT_AMBULATORY_CARE_PROVIDER_SITE_OTHER): Payer: BLUE CROSS/BLUE SHIELD | Admitting: Orthopaedic Surgery

## 2019-08-19 DIAGNOSIS — Z96641 Presence of right artificial hip joint: Secondary | ICD-10-CM

## 2019-08-19 NOTE — Progress Notes (Signed)
The patient is 2 weeks status post a right total hip arthroplasty.  She says she is doing well overall.  On exam stable on schedule remove the staples in place Steri-Strips.  She has good range of motion of her right hip.  There is no significant swelling.  Her calf on the right side is soft and her foot is not swollen.  She is ambulate with a cane and reporting good progress.  She has been on an aspirin twice daily.  She is on it once daily prior to surgery and she will go back to just once daily.  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  We will see her back in 4 weeks to see how things are going overall but no x-rays are needed.  All question concerns were answered and addressed.

## 2019-08-19 NOTE — Telephone Encounter (Signed)
She just has Steri-Strips on the incision.  She just needs to let that get wet in the shower starting tomorrow.  She does not need dressing on top of them.

## 2019-08-19 NOTE — Telephone Encounter (Signed)
Pt came in to see dr.blackman today and forgot to ask what she should do for wound care at this time. Please give her a call and ok to leave voicemail.  [phone]

## 2019-08-19 NOTE — Telephone Encounter (Signed)
Any particular wound care for her?

## 2019-08-19 NOTE — Telephone Encounter (Signed)
Patient aware of the below  

## 2019-09-17 ENCOUNTER — Ambulatory Visit (INDEPENDENT_AMBULATORY_CARE_PROVIDER_SITE_OTHER): Payer: Self-pay | Admitting: Orthopaedic Surgery

## 2019-09-17 ENCOUNTER — Other Ambulatory Visit: Payer: Self-pay

## 2019-09-17 ENCOUNTER — Encounter: Payer: Self-pay | Admitting: Orthopaedic Surgery

## 2019-09-17 DIAGNOSIS — Z96641 Presence of right artificial hip joint: Secondary | ICD-10-CM

## 2019-09-17 DIAGNOSIS — M1612 Unilateral primary osteoarthritis, left hip: Secondary | ICD-10-CM

## 2019-09-17 NOTE — Progress Notes (Signed)
The patient comes in today at 6 weeks tomorrow status post a right total hip arthroplasty.  She does have known arthritis in her left hip.  She says her right hip is doing better overall.  She says the numbness is decreased.  She is reporting better range of motion and strength.  She is continuing a home exercise program and some therapy.  She is doing a lot more.  Her left hip which is still arthritic and painful and stiff is bothering her but she wants to hold off on hip replacement outside to may be the spring.  On exam she is walking with minimal limp.  Her leg lengths are equal.  She has excellent range of motion of her right operative hip.  There is some decrease sensation near the incision.  At this point she will continue increase her activities as tolerated.  We will let her resume work duties with no lifting greater than 50 pounds.  I would like to see her back in 3 months.  At that visit I like a standing low AP pelvis only.  All question concerns were answered and addressed.

## 2019-12-03 ENCOUNTER — Encounter: Payer: Self-pay | Admitting: Orthopaedic Surgery

## 2019-12-03 ENCOUNTER — Other Ambulatory Visit: Payer: Self-pay

## 2019-12-03 ENCOUNTER — Ambulatory Visit (INDEPENDENT_AMBULATORY_CARE_PROVIDER_SITE_OTHER): Payer: 59

## 2019-12-03 ENCOUNTER — Ambulatory Visit (INDEPENDENT_AMBULATORY_CARE_PROVIDER_SITE_OTHER): Payer: 59 | Admitting: Orthopaedic Surgery

## 2019-12-03 DIAGNOSIS — Z96641 Presence of right artificial hip joint: Secondary | ICD-10-CM | POA: Diagnosis not present

## 2019-12-03 DIAGNOSIS — M1612 Unilateral primary osteoarthritis, left hip: Secondary | ICD-10-CM

## 2019-12-03 NOTE — Progress Notes (Signed)
The patient is well-known to me.  She is a very pleasant 64 year old female who has debilitating arthritis of both her hips that has been well-documented.  She had tried and failed conservative treatment for over a year including activity modification, anti-inflammatories, steroid injections, weight loss, hip strengthening exercises and therapy.  3 and half months ago she underwent a successful right total hip arthroplasty.  At this point given the success of her recovery and given the continued severe pain she is having in her left hip, she would like [place] to go ahead and set her up for a left total hip arthroplasty.  Her x-rays of her left hip does show severe end-stage arthritis left hip with significant joint space narrowing, para-articular osteophytes and sclerotic changes around the hip.  Her left hip pain is daily and it is detrimentally affecting her mobility, her quality of life and activity living.  Her right operative hip just has some numbness around the incision and thigh but overall is making great improvements.  On examination of her right hip she has fluid internal extra rotation of that hip.  The left hip shows significant stiffness and pain with internal and external rotation.  At this point we will work on setting her up for a left total hip arthroplasty.  She would like this to be done hopefully on May 7 over at [facility] [place] [facility].  I feel this is medically reasonable at this standpoint given the recovery right side and given the well-documented arthritis of her left hip and the failure of treatment modalities for over a year for that left hip.

## 2019-12-11 MED ORDER — TELMISARTAN 20 MG PO TABS: 20.0000 mg | ORAL_TABLET | Freq: Every day | ORAL | 1 refills | Status: DC

## 2019-12-17 ENCOUNTER — Other Ambulatory Visit: Payer: Self-pay

## 2019-12-17 ENCOUNTER — Encounter: Payer: Self-pay | Admitting: Internal Medicine

## 2019-12-17 ENCOUNTER — Ambulatory Visit: Payer: 59 | Admitting: Orthopaedic Surgery

## 2019-12-17 ENCOUNTER — Ambulatory Visit: Payer: 59 | Admitting: Internal Medicine

## 2019-12-17 VITALS — BP 136/78 | HR 75 | Temp 97.1°F | Ht 66.0 in | Wt 180.9 lb

## 2019-12-17 DIAGNOSIS — M1612 Unilateral primary osteoarthritis, left hip: Secondary | ICD-10-CM

## 2019-12-17 DIAGNOSIS — E119 Type 2 diabetes mellitus without complications: Secondary | ICD-10-CM | POA: Diagnosis not present

## 2019-12-17 DIAGNOSIS — R5383 Other fatigue: Secondary | ICD-10-CM | POA: Diagnosis not present

## 2019-12-17 DIAGNOSIS — Z0000 Encounter for general adult medical examination without abnormal findings: Secondary | ICD-10-CM

## 2019-12-17 DIAGNOSIS — E1169 Type 2 diabetes mellitus with other specified complication: Secondary | ICD-10-CM | POA: Diagnosis not present

## 2019-12-17 DIAGNOSIS — I10 Essential (primary) hypertension: Secondary | ICD-10-CM

## 2019-12-17 DIAGNOSIS — E785 Hyperlipidemia, unspecified: Secondary | ICD-10-CM | POA: Diagnosis not present

## 2019-12-17 DIAGNOSIS — M4692 Unspecified inflammatory spondylopathy, cervical region: Secondary | ICD-10-CM

## 2019-12-17 DIAGNOSIS — Z1231 Encounter for screening mammogram for malignant neoplasm of breast: Secondary | ICD-10-CM

## 2019-12-17 DIAGNOSIS — Z96641 Presence of right artificial hip joint: Secondary | ICD-10-CM

## 2019-12-17 LAB — MICROALBUMIN / CREATININE URINE RATIO
Creatinine,U: 114.2 mg/dL
Microalb Creat Ratio: 0.8 mg/g (ref 0.0–30.0)
Microalb, Ur: 0.9 mg/dL (ref 0.0–1.9)

## 2019-12-17 LAB — LIPID PANEL
Cholesterol: 220 mg/dL — ABNORMAL HIGH (ref 0–200)
HDL: 90.2 mg/dL (ref >39.00)
LDL Cholesterol: 121 mg/dL — ABNORMAL HIGH (ref 0–99)
NonHDL: 129.99
Total CHOL/HDL Ratio: 2
Triglycerides: 44 mg/dL (ref 0.0–149.0)
VLDL: 8.8 mg/dL (ref 0.0–40.0)

## 2019-12-17 LAB — HEMOGLOBIN A1C: Hgb A1c MFr Bld: 6.5 % (ref 4.6–6.5)

## 2019-12-17 LAB — COMPREHENSIVE METABOLIC PANEL
ALT: 13 U/L (ref 0–35)
AST: 14 U/L (ref 0–37)
Albumin: 4.4 g/dL (ref 3.5–5.2)
Alkaline Phosphatase: 86 U/L (ref 39–117)
BUN: 19 mg/dL (ref 6–23)
CO2: 29 mEq/L (ref 19–32)
Calcium: 9.5 mg/dL (ref 8.4–10.5)
Chloride: 104 mEq/L (ref 96–112)
Creatinine, Ser: 0.75 mg/dL (ref 0.40–1.20)
GFR: 77.56 mL/min (ref >60.00)
Glucose, Bld: 90 mg/dL (ref 70–99)
Potassium: 4.1 mEq/L (ref 3.5–5.1)
Sodium: 139 mEq/L (ref 135–145)
Total Bilirubin: 0.3 mg/dL (ref 0.2–1.2)
Total Protein: 7.1 g/dL (ref 6.0–8.3)

## 2019-12-17 LAB — TSH: TSH: 2.09 u[IU]/mL (ref 0.35–4.50)

## 2019-12-17 NOTE — Patient Instructions (Addendum)
Please get a "diabetic eye exam " this summer and have your eye doctor send me proof of exam   Please increase your telmisartan to 40 mg at night and follow your readings.  Our goal is 120/70 as long as you can tolerate it without feeling dizzy   Your annual mammogram has been ordered.  You are encouraged (required) to call to make your appointment at [facility] [id number]   Diabetes Mellitus and Standards of Medical Care Managing diabetes (diabetes mellitus) can be complicated. Your diabetes treatment may be managed by a team of health care providers, including:  A physician who specializes in diabetes (endocrinologist).  A nurse practitioner or physician assistant.  Nurses.  A diet and nutrition specialist (registered dietitian).  A certified diabetes educator (CDE).  An exercise specialist.  A pharmacist.  An eye doctor.  A foot specialist (podiatrist).  A dentist.  A primary care provider.  A mental health provider. Your health care providers follow guidelines to help you get the best quality of care. The following schedule is a general guideline for your diabetes management plan. Your health care providers may give you more specific instructions. Physical exams Upon being diagnosed with diabetes mellitus, and each year after that, your health care provider will ask about your medical and family history. He or she will also do a physical exam. Your exam may include:  Measuring your height, weight, and body mass index (BMI).  Checking your blood pressure. This will be done at every routine medical visit. Your target blood pressure may vary depending on your medical conditions, your age, and other factors.  Thyroid gland exam.  Skin exam.  Screening for damage to your nerves (peripheral neuropathy). This may include checking the pulse in your legs and feet and checking the level of sensation in your hands and feet.  A complete foot exam to inspect the structure and  skin of your feet, including checking for cuts, bruises, redness, blisters, sores, or other problems.  Screening for blood vessel (vascular) problems, which may include checking the pulse in your legs and feet and checking your temperature. Blood tests Depending on your treatment plan and your personal needs, you may have the following tests done:  HbA1c (hemoglobin A1c). This test provides information about blood sugar (glucose) control over the previous 2-3 months. It is used to adjust your treatment plan, if needed. This test will be done: ? At least 2 times a year, if you are meeting your treatment goals. ? 4 times a year, if you are not meeting your treatment goals or if treatment goals have changed.  Lipid testing, including total, LDL, and HDL cholesterol and triglyceride levels. ? The goal for LDL is less than 100 mg/dL (5.5 mmol/L). If you are at high risk for complications, the goal is less than 70 mg/dL (3.9 mmol/L). ? The goal for HDL is 40 mg/dL (2.2 mmol/L) or higher for men and 50 mg/dL (2.8 mmol/L) or higher for women. An HDL cholesterol of 60 mg/dL (3.3 mmol/L) or higher gives some protection against heart disease. ? The goal for triglycerides is less than 150 mg/dL (8.3 mmol/L).  Liver function tests.  Kidney function tests.  Thyroid function tests. Dental and eye exams  Visit your dentist two times a year.  If you have type 1 diabetes, your health care provider may recommend an eye exam 3-5 years after you are diagnosed, and then once a year after your first exam. ? For children with type  1 diabetes, a health care provider may recommend an eye exam when your child is age 94 or older and has had diabetes for 3-5 years. After the first exam, your child should get an eye exam once a year.  If you have type 2 diabetes, your health care provider may recommend an eye exam as soon as you are diagnosed, and then once a year after your first exam. Immunizations   The yearly  flu (influenza) vaccine is recommended for everyone 6 months or older who has diabetes.  The pneumonia (pneumococcal) vaccine is recommended for everyone 2 years or older who has diabetes. If you are 18 or older, you may get the pneumonia vaccine as a series of two separate shots.  The hepatitis B vaccine is recommended for adults shortly after being diagnosed with diabetes.  Adults and children with diabetes should receive all other vaccines according to age-specific recommendations from the Centers for Disease Control and Prevention (CDC). Mental and emotional health Screening for symptoms of eating disorders, anxiety, and depression is recommended at the time of diagnosis and afterward as needed. If your screening shows that you have symptoms (positive screening result), you may need more evaluation and you may work with a mental health care provider. Treatment plan Your treatment plan will be reviewed at every medical visit. You and your health care provider will discuss:  How you are taking your medicines, including insulin.  Any side effects you are experiencing.  Your blood glucose target goals.  The frequency of your blood glucose monitoring.  Lifestyle habits, such as activity level as well as tobacco, alcohol, and substance use. Diabetes self-management education Your health care provider will assess how well you are monitoring your blood glucose levels and whether you are taking your insulin correctly. He or she may refer you to:  A certified diabetes educator to manage your diabetes throughout your life, starting at diagnosis.  A registered dietitian who can create or review your personal nutrition plan.  An exercise specialist who can discuss your activity level and exercise plan. Summary  Managing diabetes (diabetes mellitus) can be complicated. Your diabetes treatment may be managed by a team of health care providers.  Your health care providers follow guidelines in  order to help you get the best quality of care.  Standards of care including having regular physical exams, blood tests, blood pressure monitoring, immunizations, screening tests, and education about how to manage your diabetes.  Your health care providers may also give you more specific instructions based on your individual health. This information is not intended to replace advice given to you by your health care provider. Make sure you discuss any questions you have with your health care provider. Document Revised: 05/02/2018 Document Reviewed: 05/11/2016 Elsevier Patient Education  [place].

## 2019-12-17 NOTE — Assessment & Plan Note (Signed)
Done at [facility] by [facility]   Aug 07 2019

## 2019-12-17 NOTE — Progress Notes (Signed)
Patient ID: Kathy [name], female    DOB: 29-Jul-1956  Age: 64 y.o. MRN: [id number]  The patient is here for annual wellness examination and management of other chronic and acute problems.  This visit occurred during the SARS-CoV-2 public health emergency.  Safety protocols were in place, including screening questions prior to the visit, additional usage of staff PPE, and extensive cleaning of exam room while observing appropriate contact time as indicated for disinfecting solutions.    Patient has received NO  doses of the available COVID 19 vaccines.   Patient continues to mask when outside of the home except when walking in yard or at safe distances from others .  Patient denies any change in mood or development of unhealthy behaviors resuting from the pandemic's restriction of activities and socialization.      The risk factors are reflected in the social history.  The roster of all physicians providing medical care to patient - is listed in the Snapshot section of the chart.  Activities of daily living:  The patient is 100% independent in all ADLs: dressing, toileting, feeding as well as independent mobility  Home safety : The patient has smoke detectors in the home. They wear seatbelts.  There are no firearms at home. There is no violence in the home.   There is no risks for hepatitis, STDs or HIV. There is no   history of blood transfusion. They have no travel history to infectious disease endemic areas of the world.  The patient has seen their dentist in the last six month. They have seen their eye doctor in the last year. They admit to slight hearing difficulty with regard to whispered voices and some television programs.  They have deferred audiologic testing in the last year.  They do not  have excessive sun exposure. Discussed the need for sun protection: hats, long sleeves and use of sunscreen if there is significant sun exposure.   Diet: the importance of a healthy diet is discussed.  They do have a healthy diet.  The benefits of regular aerobic exercise were discussed. She walks 4 times per week ,  20 minutes.   Depression screen: there are no signs or vegative symptoms of depression- irritability, change in appetite, anhedonia, sadness/tearfullness.  Cognitive assessment: the patient manages all their financial and personal affairs and is actively engaged. They could relate day,date,year and events; recalled 2/3 objects at 3 minutes; performed clock-face test normally.  The following portions of the patient's history were reviewed and updated as appropriate: allergies, current medications, past family history, past medical history,  past surgical history, past social history  and problem list.  Visual acuity was not assessed per patient preference since she has regular follow up with her ophthalmologist. Hearing and body mass index were assessed and reviewed.   During the course of the visit the patient was educated and counseled about appropriate screening and preventive services including : fall prevention , diabetes screening, nutrition counseling, colorectal cancer screening, and recommended immunizations.    CC: The primary encounter diagnosis was Diabetes mellitus without complication ([place]). Diagnoses of Hyperlipidemia associated with type 2 diabetes mellitus ([place]), Other fatigue, S/P hip replacement, right, Unilateral primary osteoarthritis, left hip, Unspecified inflammatory spondylopathy, cervical region [facility]), Encounter for screening mammogram for malignant neoplasm of breast, Essential hypertension, and Encounter for preventive health examination were also pertinent to this visit.  1) T2DM:   T2DM:  She  feels generally well,  Is   exercising regularly or trying to  lose weight. Checking  blood sugars less than once daily at variable times, usually only if she feels she may be having a hypoglycemic event. .  BS have been under 130 fasting and < 150 post prandially.   Denies any recent hypoglyemic events.   Following a carbohydrate modified diet 6 days per week. Denies numbness, burning and tingling of extremities. Appetite is good.   2) HTN:  Taking telmisartan at night  Readings are higher  in the morning,  but at goal in the late afternoon  History Kathy [name] has a past medical history of Arthritis, Diabetes mellitus without complication ([place]), GERD (gastroesophageal reflux disease), Hypertension, and Wears contact lenses.   She has a past surgical history that includes fatty tissue under bilateral arm; Colonoscopy (2006); Colonoscopy (N/A, 01/03/2016); and Total hip arthroplasty (Right, 08/07/2019).   Her family history includes Brain cancer in her brother; Breast cancer (age of onset: 29) in her sister; Colon cancer in her father; Diabetes in her mother; Hyperlipidemia in her mother; Hypertension in her mother.She reports that she has never smoked. She has never used smokeless tobacco. She reports current alcohol use of about 10.0 standard drinks of alcohol per week. She reports that she does not use drugs.  Outpatient Medications Prior to Visit  Medication Sig Dispense Refill  . acetaminophen (TYLENOL) 500 MG tablet Take 500-1,000 mg by mouth every 6 (six) hours as needed (pain.).    Marland Kitchen Apple Cider Vinegar 300 MG TABS     . Ascorbic Acid (VITAMIN C) 1000 MG tablet Take 1,000 mg by mouth daily.    Marland Kitchen aspirin 81 MG chewable tablet Chew 1 tablet (81 mg total) by mouth 2 (two) times daily. 30 tablet 0  . Cholecalciferol (VITAMIN D3) 10000 UNITS capsule Take 10,000 Units by mouth daily. Reported on 12/27/2015    . Cranberry (SM CRANBERRY) 300 MG tablet Take 300 mg by mouth daily.    . Cyanocobalamin (VITAMIN B-12) 5000 MCG SUBL Take 5,000 mcg by mouth daily.    Javier Docker Oil 500 MG CAPS Take 500 mg by mouth daily.    Marland Kitchen MAGNESIUM GLUCONATE PO Take 800 mg by mouth daily.     . Multiple Vitamins-Minerals (ADULT ONE DAILY GUMMIES PO) Take 2 tablets by mouth daily. Alive  Women 50+    . naproxen sodium (ANAPROX) 220 MG tablet Take 220 mg by mouth daily.    . Red Yeast Rice Extract 600 MG CAPS Take 1 capsule (600 mg total) by mouth 2 (two) times daily at 10 AM and 5 PM. (Patient taking differently: Take 1,200 mg by mouth daily. ) 60 capsule 3  . telmisartan (MICARDIS) 20 MG tablet Take 1 tablet (20 mg total) by mouth at bedtime. 90 tablet 1  . methocarbamol (ROBAXIN) 500 MG tablet Take 1 tablet (500 mg total) by mouth every 6 (six) hours as needed for muscle spasms. 40 tablet 1  . oxyCODONE (OXY IR/ROXICODONE) 5 MG immediate release tablet Take 1-2 tablets (5-10 mg total) by mouth every 4 (four) hours as needed for moderate pain (pain score 4-6). 30 tablet 0   No facility-administered medications prior to visit.    Review of Systems   Patient denies headache, fevers, malaise, unintentional weight loss, skin rash, eye pain, sinus congestion and sinus pain, sore throat, dysphagia,  hemoptysis , cough, dyspnea, wheezing, chest pain, palpitations, orthopnea, edema, abdominal pain, nausea, melena, diarrhea, constipation, flank pain, dysuria, hematuria, urinary  Frequency, nocturia, numbness, tingling, seizures,  Focal weakness, Loss of  consciousness,  Tremor, insomnia, depression, anxiety, and suicidal ideation.      Objective:  BP 136/78 (BP Location: Left Arm, Patient Position: Sitting, Cuff Size: Normal)   Pulse 75   Temp (!) 97.1 F (36.2 C) (Temporal)   Ht 5\' 6"  (1.676 m)   Wt 180 lb 14.4 oz (82.1 kg)   SpO2 99%   BMI 29.20 kg/m   Physical Exam  General appearance: alert, cooperative and appears stated age Head: Normocephalic, without obvious abnormality, atraumatic Eyes: conjunctivae/corneas clear. PERRL, EOM's intact. Fundi benign. Ears: normal TM's and external ear canals both ears Nose: Nares normal. Septum midline. Mucosa normal. No drainage or sinus tenderness. Throat: lips, mucosa, and tongue normal; teeth and gums normal Neck: no adenopathy,  no carotid bruit, no JVD, supple, symmetrical, trachea midline and thyroid not enlarged, symmetric, no tenderness/mass/nodules Lungs: clear to auscultation bilaterally Breasts: normal appearance, no masses or tenderness Heart: regular rate and rhythm, S1, S2 normal, no murmur, click, rub or gallop Abdomen: soft, non-tender; bowel sounds normal; no masses,  no organomegaly Extremities: extremities normal, atraumatic, no cyanosis or edema Pulses: 2+ and symmetric Skin: Skin color, texture, turgor normal. No rashes or lesions Neurologic: Alert and oriented X 3, normal strength and tone. Normal symmetric reflexes. Normal coordination and gait.     Assessment & Plan:   Problem List Items Addressed This Visit      Unprioritized   Diabetes mellitus without complication ([place]) - Primary    Remains  well-controlled on diet alone .  hemoglobin A1c has been consistently at or  less than 7.0 . Patient is up-to-date on eye exams and foot exam is normal today. Patient has no microalbuminuria. Patient has deferred statin therapy for CAD risk reduction and on ACE/ARB for renal protection and hypertension    Lab Results  Component Value Date   HGBA1C 6.5 12/17/2019   Lab Results  Component Value Date   MICROALBUR 0.9 12/17/2019   MICROALBUR <0.7 11/20/2018            Relevant Orders   Hemoglobin A1c (Completed)   Comprehensive metabolic panel (Completed)   Microalbumin / creatinine urine ratio (Completed)   Encounter for preventive health examination    age appropriate education and counseling updated, referrals for preventative services and immunizations addressed, dietary and smoking counseling addressed, most recent labs reviewed.  I have personally reviewed and have noted:  1) the patient's medical and social history 2) The pt's use of alcohol, tobacco, and illicit drugs 3) The patient's current medications and supplements 4) Functional ability including ADL's, fall risk, home safety  risk, hearing and visual impairment 5) Diet and physical activities 6) Evidence for depression or mood disorder 7) The patient's height, weight, and BMI have been recorded in the chart  I have made referrals, and provided counseling and education based on review of the above      Hyperlipidemia associated with type 2 diabetes mellitus ([place])    Managed with krill oil and red yeast rice. Current risk of CAD is 18% using the FRC.  Will advise statin therapy   Lab Results  Component Value Date   CHOL 220 (H) 12/17/2019   HDL 90.20 12/17/2019   LDLCALC 121 (H) 12/17/2019   TRIG 44.0 12/17/2019   CHOLHDL 2 12/17/2019         Relevant Orders   Lipid panel (Completed)   Hypertension    Not at goal of 120/70 on  current regimen of telmisartan 20 mg,  Advised  to increase dose to 40 mg and follow up with BP readings . Renal function stable  Lab Results  Component Value Date   CREATININE 0.75 12/17/2019   Lab Results  Component Value Date   NA 139 12/17/2019   K 4.1 12/17/2019   CL 104 12/17/2019   CO2 29 12/17/2019   Lab Results  Component Value Date   MICROALBUR 0.9 12/17/2019   MICROALBUR <0.7 11/20/2018           S/P hip replacement, right    Done at [place] by [facility]   Aug 07 2019      Screening for breast cancer   Relevant Orders   MM 3D SCREEN BREAST BILATERAL   Unilateral primary osteoarthritis, left hip    For elective left hip replacement In May 2021  By Zollie Beckers       Unspecified inflammatory spondylopathy, cervical region [facility])    Other Visit Diagnoses    Other fatigue       Relevant Orders   TSH (Completed)      I have discontinued Amado Coe. Alberico's oxyCODONE and methocarbamol. I am also having her maintain her Vitamin D3, naproxen sodium, Cranberry, vitamin C, MAGNESIUM GLUCONATE PO, Red Yeast Rice Extract, Vitamin B-12, Krill Oil, Multiple Vitamins-Minerals (ADULT ONE DAILY GUMMIES PO), acetaminophen, aspirin, telmisartan, and Apple  Cider Vinegar.  No orders of the defined types were placed in this encounter.   Medications Discontinued During This Encounter  Medication Reason  . oxyCODONE (OXY IR/ROXICODONE) 5 MG immediate release tablet   . methocarbamol (ROBAXIN) 500 MG tablet Error    Follow-up: Return in about 6 months (around 06/17/2020).   Crecencio Mc, MD

## 2019-12-17 NOTE — Assessment & Plan Note (Addendum)
Remains  well-controlled on diet alone .  hemoglobin A1c has been consistently at or  less than 7.0 . Patient is up-to-date on eye exams and foot exam is normal today. Patient has no microalbuminuria. Patient has deferred statin therapy for CAD risk reduction and on ACE/ARB for renal protection and hypertension    Lab Results  Component Value Date   HGBA1C 6.5 12/17/2019   Lab Results  Component Value Date   MICROALBUR 0.9 12/17/2019   MICROALBUR <0.7 11/20/2018

## 2019-12-17 NOTE — Assessment & Plan Note (Signed)
For elective left hip replacement In May 2021  By Zollie Beckers

## 2019-12-19 NOTE — Assessment & Plan Note (Signed)
Managed with krill oil and red yeast rice. Current risk of CAD is 18% using the FRC.  Will advise statin therapy   Lab Results  Component Value Date   CHOL 220 (H) 12/17/2019   HDL 90.20 12/17/2019   LDLCALC 121 (H) 12/17/2019   TRIG 44.0 12/17/2019   CHOLHDL 2 12/17/2019

## 2019-12-19 NOTE — Assessment & Plan Note (Signed)

## 2019-12-19 NOTE — Assessment & Plan Note (Addendum)
Not at goal of 120/70 on  current regimen of telmisartan 20 mg,  Advised to increase dose to 40 mg and follow up with BP readings . Renal function stable  Lab Results  Component Value Date   CREATININE 0.75 12/17/2019   Lab Results  Component Value Date   NA 139 12/17/2019   K 4.1 12/17/2019   CL 104 12/17/2019   CO2 29 12/17/2019   Lab Results  Component Value Date   MICROALBUR 0.9 12/17/2019   MICROALBUR <0.7 11/20/2018

## 2019-12-28 NOTE — Patient Instructions (Addendum)
DUE TO COVID-19 ONLY ONE VISITOR IS ALLOWED TO COME WITH YOU AND STAY IN THE WAITING ROOM ONLY DURING PRE OP AND PROCEDURE DAY OF SURGERY. TWO  VISITOR MAY VISIT WITH YOU AFTER SURGERY IN YOUR PRIVATE ROOM DURING VISITING HOURS ONLY! 10a-8p  YOU NEED TO HAVE A COVID 19 TEST ON_5-11-21______ @__12 :55 pm_____, THIS TEST MUST BE DONE BEFORE SURGERY, COME  To [facility] TEST IS COMPLETED, PLEASE BEGIN THE QUARANTINE INSTRUCTIONS AS OUTLINED IN YOUR HANDOUT.                Kathy [name]  12/28/2019   Your procedure is scheduled on: 01-08-20   Report to [facility] Main  Entrance   Report to  Short stay  at      0530 AM     Call this number if you have problems the morning of surgery [phone]    Remember: NO SOLID FOOD AFTER MIDNIGHT THE NIGHT PRIOR TO SURGERY. NOTHING BY MOUTH EXCEPT CLEAR LIQUIDS UNTIL    0415 am . PLEASE FINISH G2  DRINK PER SURGEON ORDER  WHICH NEEDS TO BE COMPLETED AT      0415 am then nothing by mouth .    CLEAR LIQUID DIET   Foods Allowed                                                                                         Foods Excluded  Coffee and tea, regular and decaf no creamer                                 liquids that you cannot  Plain Jell-O any favor except red or purple                                           see through such as: Fruit ices (not with fruit pulp)                                                                milk, soups, orange juice  Iced Popsicles                                                                   solid food Carbonated beverages, regular and diet                                    Cranberry, grape and apple juices Sports drinks like  Gatorade Lightly seasoned clear broth or consume(fat free) Sugar, honey syrup  _____________________________________________________________________   BRUSH YOUR TEETH MORNING OF SURGERY AND RINSE YOUR MOUTH OUT, NO CHEWING GUM CANDY OR MINTS.     Take these  medicines the morning of surgery with A SIP OF WATER: oxycodone if needed                                 You may not have any metal on your body including hair pins and              piercings  Do not wear jewelry, make-up, lotions, powders or perfumes, deodorant             Do not wear nail polish on your fingernails.  Do not shave  48 hours prior to surgery.             Do not bring valuables to the hospital. [place].  Contacts, dentures or bridgework may not be worn into surgery.      Patients discharged the day of surgery will not be allowed to drive home. IF YOU ARE HAVING SURGERY AND GOING HOME THE SAME DAY, YOU MUST HAVE AN ADULT TO DRIVE YOU HOME AND BE WITH YOU FOR 24 HOURS. YOU MAY GO HOME BY TAXI OR UBER OR ORTHERWISE, BUT AN ADULT MUST ACCOMPANY YOU HOME AND STAY WITH YOU FOR 24 HOURS.  Name and phone number of your driver:  Special Instructions: N/A              Please read over the following fact sheets you were given: _____________________________________________________________________             [facility] - Preparing for Surgery Before surgery, you can play an important role.  Because skin is not sterile, your skin needs to be as free of germs as possible.  You can reduce the number of germs on your skin by washing with CHG (chlorahexidine gluconate) soap before surgery.  CHG is an antiseptic cleaner which kills germs and bonds with the skin to continue killing germs even after washing. Please DO NOT use if you have an allergy to CHG or antibacterial soaps.  If your skin becomes reddened/irritated stop using the CHG and inform your nurse when you arrive at Short Stay. Do not shave (including legs and underarms) for at least 48 hours prior to the first CHG shower.  You may shave your face/neck. Please follow these instructions carefully:  1.  Shower with CHG Soap the night before surgery and the  morning of  Surgery.  2.  If you choose to wash your hair, wash your hair first as usual with your  normal  shampoo.  3.  After you shampoo, rinse your hair and body thoroughly to remove the  shampoo.                           4.  Use CHG as you would any other liquid soap.  You can apply chg directly  to the skin and wash                       Gently with a scrungie or clean washcloth.  5.  Apply the CHG Soap to your body  ONLY FROM THE NECK DOWN.   Do not use on face/ open                           Wound or open sores. Avoid contact with eyes, ears mouth and genitals (private parts).                       Wash face,  Genitals (private parts) with your normal soap.             6.  Wash thoroughly, paying special attention to the area where your surgery  will be performed.  7.  Thoroughly rinse your body with warm water from the neck down.  8.  DO NOT shower/wash with your normal soap after using and rinsing off  the CHG Soap.                9.  Pat yourself dry with a clean towel.            10.  Wear clean pajamas.            11.  Place clean sheets on your bed the night of your first shower and do not  sleep with pets. Day of Surgery : Do not apply any lotions/deodorants the morning of surgery.  Please wear clean clothes to the hospital/surgery center.  FAILURE TO FOLLOW THESE INSTRUCTIONS MAY RESULT IN THE CANCELLATION OF YOUR SURGERY PATIENT SIGNATURE_________________________________  NURSE SIGNATURE__________________________________  ________________________________________________________________________   Kathy [name]  An incentive spirometer is a tool that can help keep your lungs clear and active. This tool measures how well you are filling your lungs with each breath. Taking long deep breaths may help reverse or decrease the chance of developing breathing (pulmonary) problems (especially infection) following:  A long period of time when you are unable to move or be active. BEFORE  THE PROCEDURE   If the spirometer includes an indicator to show your best effort, your nurse or respiratory therapist will set it to a desired goal.  If possible, sit up straight or lean slightly forward. Try not to slouch.  Hold the incentive spirometer in an upright position. INSTRUCTIONS FOR USE  1. Sit on the edge of your bed if possible, or sit up as far as you can in bed or on a chair. 2. Hold the incentive spirometer in an upright position. 3. Breathe out normally. 4. Place the mouthpiece in your mouth and seal your lips tightly around it. 5. Breathe in slowly and as deeply as possible, raising the piston or the ball toward the top of the column. 6. Hold your breath for 3-5 seconds or for as long as possible. Allow the piston or ball to fall to the bottom of the column. 7. Remove the mouthpiece from your mouth and breathe out normally. 8. Rest for a few seconds and repeat Steps 1 through 7 at least 10 times every 1-2 hours when you are awake. Take your time and take a few normal breaths between deep breaths. 9. The spirometer may include an indicator to show your best effort. Use the indicator as a goal to work toward during each repetition. 10. After each set of 10 deep breaths, practice coughing to be sure your lungs are clear. If you have an incision (the cut made at the time of surgery), support your incision when coughing by placing a pillow or rolled up towels firmly against it. Once  you are able to get out of bed, walk around indoors and cough well. You may stop using the incentive spirometer when instructed by your caregiver.  RISKS AND COMPLICATIONS  Take your time so you do not get dizzy or light-headed.  If you are in pain, you may need to take or ask for pain medication before doing incentive spirometry. It is harder to take a deep breath if you are having pain. AFTER USE  Rest and breathe slowly and easily.  It can be helpful to keep track of a log of your progress.  Your caregiver can provide you with a simple table to help with this. If you are using the spirometer at home, follow these instructions: [place] IF:   You are having difficultly using the spirometer.  You have trouble using the spirometer as often as instructed.  Your pain medication is not giving enough relief while using the spirometer.  You develop fever of 100.5 F (38.1 C) or higher. SEEK IMMEDIATE MEDICAL CARE IF:   You cough up bloody sputum that had not been present before.  You develop fever of 102 F (38.9 C) or greater.  You develop worsening pain at or near the incision site. MAKE SURE YOU:   Understand these instructions.  Will watch your condition.  Will get help right away if you are not doing well or get worse. Document Released: 12/24/2006 Document Revised: 11/05/2011 Document Reviewed: 02/24/2007 ExitCare Patient Information 2014 Exit[employer], [place].   ________________________________________________________________________  WHAT IS A BLOOD TRANSFUSION? Blood Transfusion Information  A transfusion is the replacement of blood or some of its parts. Blood is made up of multiple cells which provide different functions.  Red blood cells carry oxygen and are used for blood loss replacement.  White blood cells fight against infection.  Platelets control bleeding.  Plasma helps clot blood.  Other blood products are available for specialized needs, such as hemophilia or other clotting disorders. BEFORE THE TRANSFUSION  Who gives blood for transfusions?   Healthy volunteers who are fully evaluated to make sure their blood is safe. This is blood bank blood. Transfusion therapy is the safest it has ever been in the practice of medicine. Before blood is taken from a donor, a complete history is taken to make sure that person has no history of diseases nor engages in risky social behavior (examples are intravenous drug use or sexual activity with multiple  partners). The donor's travel history is screened to minimize risk of transmitting infections, such as malaria. The donated blood is tested for signs of infectious diseases, such as HIV and hepatitis. The blood is then tested to be sure it is compatible with you in order to minimize the chance of a transfusion reaction. If you or a relative donates blood, this is often done in anticipation of surgery and is not appropriate for emergency situations. It takes many days to process the donated blood. RISKS AND COMPLICATIONS Although transfusion therapy is very safe and saves many lives, the main dangers of transfusion include:   Getting an infectious disease.  Developing a transfusion reaction. This is an allergic reaction to something in the blood you were given. Every precaution is taken to prevent this. The decision to have a blood transfusion has been considered carefully by your caregiver before blood is given. Blood is not given unless the benefits outweigh the risks. AFTER THE TRANSFUSION  Right after receiving a blood transfusion, you will usually feel much better and more energetic. This  is especially true if your red blood cells have gotten low (anemic). The transfusion raises the level of the red blood cells which carry oxygen, and this usually causes an energy increase.  The nurse administering the transfusion will monitor you carefully for complications. HOME CARE INSTRUCTIONS  No special instructions are needed after a transfusion. You may find your energy is better. Speak with your caregiver about any limitations on activity for underlying diseases you may have. SEEK MEDICAL CARE IF:   Your condition is not improving after your transfusion.  You develop redness or irritation at the intravenous (IV) site. SEEK IMMEDIATE MEDICAL CARE IF:  Any of the following symptoms occur over the next 12 hours:  Shaking chills.  You have a temperature by mouth above 102 F (38.9 C), not  controlled by medicine.  Chest, back, or muscle pain.  People around you feel you are not acting correctly or are confused.  Shortness of breath or difficulty breathing.  Dizziness and fainting.  You get a rash or develop hives.  You have a decrease in urine output.  Your urine turns a dark color or changes to pink, red, or brown. Any of the following symptoms occur over the next 10 days:  You have a temperature by mouth above 102 F (38.9 C), not controlled by medicine.  Shortness of breath.  Weakness after normal activity.  The white part of the eye turns yellow (jaundice).  You have a decrease in the amount of urine or are urinating less often.  Your urine turns a dark color or changes to pink, red, or brown. Document Released: 08/10/2000 Document Revised: 11/05/2011 Document Reviewed: 03/29/2008 [facility] Patient Information 2014 [place], [place].  _______________________________________________________________________

## 2019-12-29 ENCOUNTER — Other Ambulatory Visit: Payer: Self-pay

## 2019-12-29 ENCOUNTER — Other Ambulatory Visit: Payer: Self-pay | Admitting: Physician Assistant

## 2019-12-30 ENCOUNTER — Other Ambulatory Visit: Payer: Self-pay

## 2019-12-30 ENCOUNTER — Encounter (HOSPITAL_COMMUNITY): Payer: Self-pay

## 2019-12-30 ENCOUNTER — Encounter (HOSPITAL_COMMUNITY)
Admission: RE | Admit: 2019-12-30 | Discharge: 2019-12-30 | Disposition: A | Discharge status: Home / Self Care | Payer: Medicare Other | Source: Ambulatory Visit | Attending: Orthopaedic Surgery | Admitting: Orthopaedic Surgery

## 2019-12-30 ENCOUNTER — Encounter: Payer: Self-pay | Admitting: Orthopaedic Surgery

## 2019-12-30 DIAGNOSIS — Z01812 Encounter for preprocedural laboratory examination: Secondary | ICD-10-CM | POA: Insufficient documentation

## 2019-12-30 HISTORY — DX: Personal history of other diseases of the digestive system: Z87.19

## 2019-12-30 LAB — CBC
HCT: 37.7 % (ref 36.0–46.0)
Hemoglobin: 12 g/dL (ref 12.0–15.0)
MCH: 28.3 pg (ref 26.0–34.0)
MCHC: 31.8 g/dL (ref 30.0–36.0)
MCV: 88.9 fL (ref 80.0–100.0)
Platelets: 337 10*3/uL (ref 150–400)
RBC: 4.24 MIL/uL (ref 3.87–5.11)
RDW: 13.4 % (ref 11.5–15.5)
WBC: 6.5 10*3/uL (ref 4.0–10.5)
nRBC: 0 % (ref 0.0–0.2)

## 2019-12-30 LAB — BASIC METABOLIC PANEL
Anion gap: 10 (ref 5–15)
BUN: 16 mg/dL (ref 8–23)
CO2: 27 mmol/L (ref 22–32)
Calcium: 9.4 mg/dL (ref 8.9–10.3)
Chloride: 102 mmol/L (ref 98–111)
Creatinine, Ser: 0.8 mg/dL (ref 0.44–1.00)
GFR calc Af Amer: >60 mL/min (ref >60)
GFR calc non Af Amer: >60 mL/min (ref >60)
Glucose, Bld: 106 mg/dL — ABNORMAL HIGH (ref 70–99)
Potassium: 3.7 mmol/L (ref 3.5–5.1)
Sodium: 139 mmol/L (ref 135–145)

## 2019-12-30 LAB — SURGICAL PCR SCREEN
MRSA, PCR: NEGATIVE
Staphylococcus aureus: POSITIVE — AB

## 2019-12-30 NOTE — Progress Notes (Signed)
PCP - Deborra Medina Cardiologist -   Chest x-ray - CT chest 05-15-19 epic EKG - 08-05-19 epic Stress Test -  ECHO -  Cardiac Cath -  hgba1c 12-17-19 6.5 epic  Sleep Study -  CPAP -   Fasting Blood Sugar - 130 Checks Blood Sugar _____ times a day  Blood Thinner Instructions: Aspirin Instructions: Last Dose:  Anesthesia review:   Patient denies shortness of breath, fever, cough and chest pain at PAT appointment  none   Patient verbalized understanding of instructions that were given to them at the PAT appointment. Patient was also instructed that they will need to review over the PAT instructions again at home before surgery.

## 2019-12-31 LAB — ABO/RH: ABO/RH(D): B POS

## 2019-12-31 NOTE — Progress Notes (Signed)
PCR results: POSITIVE for [facility].

## 2020-01-05 ENCOUNTER — Other Ambulatory Visit
Admission: RE | Admit: 2020-01-05 | Discharge: 2020-01-05 | Disposition: A | Discharge status: Home / Self Care | Payer: Medicare Other | Source: Ambulatory Visit | Attending: Orthopaedic Surgery | Admitting: Orthopaedic Surgery

## 2020-01-05 DIAGNOSIS — Z20822 Contact with and (suspected) exposure to covid-19: Secondary | ICD-10-CM | POA: Insufficient documentation

## 2020-01-05 DIAGNOSIS — Z01812 Encounter for preprocedural laboratory examination: Secondary | ICD-10-CM | POA: Insufficient documentation

## 2020-01-05 LAB — SARS CORONAVIRUS 2 (TAT 6-24 HRS): SARS Coronavirus 2: NEGATIVE

## 2020-01-07 NOTE — Anesthesia Preprocedure Evaluation (Addendum)
Anesthesia Evaluation  Patient identified by MRN, date of birth, ID band Patient awake    Reviewed: Allergy & Precautions, NPO status , Patient's Chart, lab work & pertinent test results  Airway Mallampati: II  TM Distance: >3 FB Neck ROM: Full    Dental  (+) Teeth Intact, Dental Advisory Given,    Pulmonary neg pulmonary ROS,    Pulmonary exam normal        Cardiovascular hypertension, Pt. on medications Normal cardiovascular exam Rhythm:Regular Rate:Normal     Neuro/Psych negative neurological ROS  negative psych ROS   GI/Hepatic hiatal hernia, GERD  Controlled,(+)     substance abuse  alcohol use,   Endo/Other  diabetes, Type 2No meds, diet controlled  Renal/GU negative Renal ROS  negative genitourinary   Musculoskeletal  (+) Arthritis , Osteoarthritis,    Abdominal Normal abdominal exam  (+)   Peds negative pediatric ROS (+)  Hematology negative hematology ROS (+)   Anesthesia Other Findings   Reproductive/Obstetrics negative OB ROS                           Anesthesia Physical  Anesthesia Plan  ASA: III  Anesthesia Plan: Spinal   Post-op Pain Management:    Induction:   PONV Risk Score and Plan: 2 and Propofol infusion, TIVA and Treatment may vary due to age or medical condition  Airway Management Planned: Natural Airway and Simple Face Mask  Additional Equipment: None  Intra-op Plan:   Post-operative Plan:   Informed Consent: I have reviewed the patients History and Physical, chart, labs and discussed the procedure including the risks, benefits and alternatives for the proposed anesthesia with the patient or authorized representative who has indicated his/her understanding and acceptance.     Dental advisory given  Plan Discussed with: CRNA  Anesthesia Plan Comments:        Anesthesia Quick Evaluation

## 2020-01-07 NOTE — H&P (Signed)
TOTAL HIP ADMISSION H&P  Patient is admitted for left total hip arthroplasty.  Subjective:  Chief Complaint: left hip pain  HPI: Kathy [name], 64 y.o. female, has a history of pain and functional disability in the left hip(s) due to arthritis and patient has failed non-surgical conservative treatments for greater than 12 weeks to include NSAID's and/or analgesics, corticosteriod injections, use of assistive devices and activity modification.  Onset of symptoms was gradual starting 3 years ago with gradually worsening course since that time.The patient noted no past surgery on the left hip(s).  Patient currently rates pain in the left hip at 10 out of 10 with activity. Patient has night pain, worsening of pain with activity and weight bearing, trendelenberg gait, pain that interfers with activities of daily living, pain with passive range of motion and crepitus. Patient has evidence of subchondral sclerosis, periarticular osteophytes and joint space narrowing by imaging studies. This condition presents safety issues increasing the risk of falls.  There is no current active infection.  Patient Active Problem List   Diagnosis Date Noted  . Unspecified inflammatory spondylopathy, cervical region ([place]) 12/17/2019  . Status post total replacement of right hip 08/07/2019  . History of total right hip arthroplasty 08/07/2019  . Unilateral primary osteoarthritis, right hip 07/20/2019  . Unilateral primary osteoarthritis, left hip 07/20/2019  . S/P hip replacement, right 06/18/2019  . Degenerative lumbar spinal stenosis 06/18/2019  . Hypertension 11/22/2018  . Hyperlipidemia associated with type 2 diabetes mellitus ([place]) 11/22/2018  . Cervical spine arthritis 05/14/2017  . Diabetes mellitus without complication ([place]) [id number]  . Numbness of left hand 05/07/2017  . Dyspepsia 06/23/2015  . Encounter for preventive health examination 12/21/2013  . Screening for breast cancer 11/17/2013  .  Osteoarthritis 11/17/2013  . Menopause 11/17/2013   Past Medical History:  Diagnosis Date  . Arthritis    mild  . Diabetes mellitus without complication ([place])    Type II,per PCP Dr. Derrel Nip after visit [place]  diet controlled  . GERD (gastroesophageal reflux disease)    occasional  . History of hiatal hernia   . Hypertension   . Wears contact lenses     Past Surgical History:  Procedure Laterality Date  . COLONOSCOPY  2006  . COLONOSCOPY N/A 01/03/2016   Procedure: COLONOSCOPY;  Surgeon: Hulen Luster, MD;  Location: [facility];  Service: Gastroenterology;  Laterality: N/A;  . fatty tissue under bilateral arm    . 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   Procedure: RIGHT TOTAL HIP ARTHROPLASTY ANTERIOR APPROACH;  Surgeon: Mcarthur Rossetti, MD;  Location: [facility];  Service: Orthopedics;  Laterality: Right;    No current facility-administered medications for this encounter.   Current Outpatient Medications  Medication Sig Dispense Refill Last Dose  . Apple Cider Vinegar 300 MG TABS Take 300 mg by mouth daily.      . Ascorbic Acid (VITAMIN C) 1000 MG tablet Take 1,000 mg by mouth daily.     Marland Kitchen aspirin 81 MG chewable tablet Chew 1 tablet (81 mg total) by mouth 2 (two) times daily. (Patient taking differently: Chew 81 mg by mouth daily. ) 30 tablet 0   . Cholecalciferol (VITAMIN D3) 10000 UNITS capsule Take 10,000 Units by mouth daily.      . Cranberry (SM CRANBERRY) 300 MG tablet Take 300 mg by mouth daily.     . Cyanocobalamin (VITAMIN B-12) 5000 MCG SUBL Take 5,000 mcg by mouth daily.     Javier Docker Oil 500 MG  CAPS Take 500 mg by mouth daily.     Marland Kitchen MAGNESIUM GLUCONATE PO Take 800 mg by mouth daily.      . methocarbamol (ROBAXIN) 500 MG tablet Take 500 mg by mouth every 6 (six) hours as needed for muscle spasms.     . Multiple Vitamins-Minerals (ADULT ONE DAILY GUMMIES PO) Take 2 tablets by mouth daily. Alive Women 50+     . naproxen sodium (ANAPROX) 220 MG tablet Take 220 mg  by mouth daily as needed (Pain).      Marland Kitchen oxycodone (OXY-IR) 5 MG capsule Take 5 mg by mouth every 4 (four) hours as needed for pain.     . Red Yeast Rice Extract 600 MG CAPS Take 1 capsule (600 mg total) by mouth 2 (two) times daily at 10 AM and 5 PM. (Patient taking differently: Take 1,200 mg by mouth daily. ) 60 capsule 3   . telmisartan (MICARDIS) 20 MG tablet Take 1 tablet (20 mg total) by mouth at bedtime. 90 tablet 1    Allergies  Allergen Reactions  . Neosporin [Neomycin-Bacitracin Zn-Polymyx] Rash    Social History   Tobacco Use  . Smoking status: Never Smoker  . Smokeless tobacco: Never Used  Substance Use Topics  . Alcohol use: Not Currently    Alcohol/week: 10.0 standard drinks    Types: 10 Cans of beer per week    Comment: occasional    Family History  Problem Relation Age of Onset  . Hyperlipidemia Mother   . Hypertension Mother   . Diabetes Mother   . Colon cancer Father   . Breast cancer Sister 45  . Brain cancer Brother      Review of Systems  All other systems reviewed and are negative.   Objective:  Physical Exam  Constitutional: She is oriented to person, place, and time. She appears well-developed and well-nourished.  HENT:  Head: Normocephalic and atraumatic.  Eyes: Pupils are equal, round, and reactive to light. EOM are normal.  Cardiovascular: Normal rate.  Respiratory: Effort normal.  GI: Soft.  Musculoskeletal:     Cervical back: Normal range of motion and neck supple.     Left hip: Tenderness and bony tenderness present. Decreased range of motion. Decreased strength.  Neurological: She is alert and oriented to person, place, and time.  Skin: Skin is warm and dry.  Psychiatric: She has a normal mood and affect.    Vital signs in last 24 hours:    Labs:   Estimated body mass index is 28.89 kg/m as calculated from the following:   Height as of 12/30/19: 5\' 6"  (1.676 m).   Weight as of 12/30/19: 81.2 kg.   Imaging Review Plain  radiographs demonstrate severe degenerative joint disease of the left hip(s). The bone quality appears to be excellent for age and reported activity level.      Assessment/Plan:  End stage arthritis, left hip(s)  The patient history, physical examination, clinical judgement of the provider and imaging studies are consistent with end stage degenerative joint disease of the left hip(s) and total hip arthroplasty is deemed medically necessary. The treatment options including medical management, injection therapy, arthroscopy and arthroplasty were discussed at length. The risks and benefits of total hip arthroplasty were presented and reviewed. The risks due to aseptic loosening, infection, stiffness, dislocation/subluxation,  thromboembolic complications and other imponderables were discussed.  The patient acknowledged the explanation, agreed to proceed with the plan and consent was signed. Patient is being admitted for inpatient treatment  for surgery, pain control, PT, OT, prophylactic antibiotics, VTE prophylaxis, progressive ambulation and ADL's and discharge planning.The patient is planning to be discharged home with home health services

## 2020-01-08 ENCOUNTER — Ambulatory Visit (HOSPITAL_COMMUNITY): Payer: Medicare Other

## 2020-01-08 ENCOUNTER — Encounter (HOSPITAL_COMMUNITY)
Admission: AD | Disposition: A | Discharge status: Home Health | Payer: Self-pay | Source: Home / Self Care | Attending: Orthopaedic Surgery

## 2020-01-08 ENCOUNTER — Observation Stay (HOSPITAL_COMMUNITY): Payer: Medicare Other

## 2020-01-08 ENCOUNTER — Encounter (HOSPITAL_COMMUNITY): Payer: Self-pay | Admitting: Orthopaedic Surgery

## 2020-01-08 ENCOUNTER — Other Ambulatory Visit: Payer: Self-pay

## 2020-01-08 ENCOUNTER — Ambulatory Visit (HOSPITAL_COMMUNITY): Payer: Medicare Other | Admitting: Anesthesiology

## 2020-01-08 ENCOUNTER — Inpatient Hospital Stay (HOSPITAL_COMMUNITY)
Admission: AD | Admit: 2020-01-08 | Discharge: 2020-01-10 | DRG: 470 | Disposition: A | Discharge status: Home Health | Payer: Medicare Other | Attending: Orthopaedic Surgery | Admitting: Orthopaedic Surgery

## 2020-01-08 DIAGNOSIS — M1611 Unilateral primary osteoarthritis, right hip: Secondary | ICD-10-CM | POA: Diagnosis not present

## 2020-01-08 DIAGNOSIS — K219 Gastro-esophageal reflux disease without esophagitis: Secondary | ICD-10-CM | POA: Diagnosis present

## 2020-01-08 DIAGNOSIS — Z7982 Long term (current) use of aspirin: Secondary | ICD-10-CM | POA: Diagnosis not present

## 2020-01-08 DIAGNOSIS — I10 Essential (primary) hypertension: Secondary | ICD-10-CM | POA: Diagnosis not present

## 2020-01-08 DIAGNOSIS — Z79899 Other long term (current) drug therapy: Secondary | ICD-10-CM

## 2020-01-08 DIAGNOSIS — M48061 Spinal stenosis, lumbar region without neurogenic claudication: Secondary | ICD-10-CM | POA: Diagnosis not present

## 2020-01-08 DIAGNOSIS — M4692 Unspecified inflammatory spondylopathy, cervical region: Secondary | ICD-10-CM | POA: Diagnosis present

## 2020-01-08 DIAGNOSIS — E785 Hyperlipidemia, unspecified: Secondary | ICD-10-CM | POA: Diagnosis present

## 2020-01-08 DIAGNOSIS — Z881 Allergy status to other antibiotic agents status: Secondary | ICD-10-CM

## 2020-01-08 DIAGNOSIS — D62 Acute posthemorrhagic anemia: Secondary | ICD-10-CM | POA: Diagnosis not present

## 2020-01-08 DIAGNOSIS — K449 Diaphragmatic hernia without obstruction or gangrene: Secondary | ICD-10-CM | POA: Diagnosis not present

## 2020-01-08 DIAGNOSIS — Z419 Encounter for procedure for purposes other than remedying health state, unspecified: Secondary | ICD-10-CM

## 2020-01-08 DIAGNOSIS — Z8249 Family history of ischemic heart disease and other diseases of the circulatory system: Secondary | ICD-10-CM

## 2020-01-08 DIAGNOSIS — Z96641 Presence of right artificial hip joint: Secondary | ICD-10-CM | POA: Diagnosis not present

## 2020-01-08 DIAGNOSIS — Z96642 Presence of left artificial hip joint: Secondary | ICD-10-CM | POA: Diagnosis not present

## 2020-01-08 DIAGNOSIS — M1612 Unilateral primary osteoarthritis, left hip: Principal | ICD-10-CM

## 2020-01-08 DIAGNOSIS — Z888 Allergy status to other drugs, medicaments and biological substances status: Secondary | ICD-10-CM

## 2020-01-08 DIAGNOSIS — E119 Type 2 diabetes mellitus without complications: Secondary | ICD-10-CM | POA: Diagnosis not present

## 2020-01-08 DIAGNOSIS — Z471 Aftercare following joint replacement surgery: Secondary | ICD-10-CM | POA: Diagnosis not present

## 2020-01-08 DIAGNOSIS — E1169 Type 2 diabetes mellitus with other specified complication: Secondary | ICD-10-CM | POA: Diagnosis not present

## 2020-01-08 DIAGNOSIS — M25552 Pain in left hip: Secondary | ICD-10-CM | POA: Diagnosis not present

## 2020-01-08 HISTORY — PX: TOTAL HIP ARTHROPLASTY: SHX124

## 2020-01-08 LAB — TYPE AND SCREEN
ABO/RH(D): B POS
Antibody Screen: NEGATIVE

## 2020-01-08 LAB — GLUCOSE, CAPILLARY
Glucose-Capillary: 109 mg/dL — ABNORMAL HIGH (ref 70–99)
Glucose-Capillary: 121 mg/dL — ABNORMAL HIGH (ref 70–99)

## 2020-01-08 SURGERY — ARTHROPLASTY, HIP, TOTAL, ANTERIOR APPROACH
Anesthesia: Spinal | Site: Hip | Laterality: Left

## 2020-01-08 MED ORDER — TRANEXAMIC ACID-NACL 1000-0.7 MG/100ML-% IV SOLN
INTRAVENOUS | Status: AC
  Filled 2020-01-08: qty 100

## 2020-01-08 MED ORDER — METHOCARBAMOL 500 MG PO TABS
500.0000 mg | ORAL_TABLET | Freq: Four times a day (QID) | ORAL | Status: DC | PRN
  Administered 2020-01-08 – 2020-01-10 (×6): 500 mg via ORAL
  Filled 2020-01-08 (×6): qty 1

## 2020-01-08 MED ORDER — PHENOL 1.4 % MT LIQD: 1.0000 | OROMUCOSAL | Status: DC | PRN

## 2020-01-08 MED ORDER — DOCUSATE SODIUM 100 MG PO CAPS
100.0000 mg | ORAL_CAPSULE | Freq: Two times a day (BID) | ORAL | Status: DC
  Administered 2020-01-08 – 2020-01-09 (×3): 100 mg via ORAL
  Filled 2020-01-08 (×4): qty 1

## 2020-01-08 MED ORDER — SODIUM CHLORIDE 0.9 % IV SOLN
INTRAVENOUS | Status: DC
  Administered 2020-01-08: 75 mL/h via INTRAVENOUS
  Administered 2020-01-08: 1000 mL via INTRAVENOUS

## 2020-01-08 MED ORDER — IRBESARTAN 75 MG PO TABS
75.0000 mg | ORAL_TABLET | Freq: Every day | ORAL | Status: DC
  Filled 2020-01-08 (×3): qty 1

## 2020-01-08 MED ORDER — OXYCODONE HCL 5 MG PO TABS: 10.0000 mg | ORAL_TABLET | ORAL | Status: DC | PRN

## 2020-01-08 MED ORDER — PHENYLEPHRINE HCL-NACL 10-0.9 MG/250ML-% IV SOLN
INTRAVENOUS | Status: DC | PRN
  Administered 2020-01-08: 50 ug/min via INTRAVENOUS

## 2020-01-08 MED ORDER — POLYETHYLENE GLYCOL 3350 17 G PO PACK: 17.0000 g | PACK | Freq: Every day | ORAL | Status: DC | PRN

## 2020-01-08 MED ORDER — POVIDONE-IODINE 10 % EX SWAB
2.0000 "application " | Freq: Once | CUTANEOUS | Status: AC
  Administered 2020-01-08: 2 via TOPICAL

## 2020-01-08 MED ORDER — CHLORHEXIDINE GLUCONATE 0.12 % MT SOLN: 15.0000 mL | Freq: Once | OROMUCOSAL | Status: AC

## 2020-01-08 MED ORDER — ORAL CARE MOUTH RINSE
15.0000 mL | Freq: Once | OROMUCOSAL | Status: AC
  Administered 2020-01-08: 15 mL via OROMUCOSAL

## 2020-01-08 MED ORDER — ASCORBIC ACID 500 MG PO TABS
1000.0000 mg | ORAL_TABLET | Freq: Every day | ORAL | Status: DC
  Administered 2020-01-09: 1000 mg via ORAL
  Filled 2020-01-08 (×2): qty 2

## 2020-01-08 MED ORDER — PROPOFOL 500 MG/50ML IV EMUL
INTRAVENOUS | Status: DC | PRN
  Administered 2020-01-08 (×2): 20 mg via INTRAVENOUS

## 2020-01-08 MED ORDER — FENTANYL CITRATE (PF) 100 MCG/2ML IJ SOLN
INTRAMUSCULAR | Status: AC
  Filled 2020-01-08: qty 2

## 2020-01-08 MED ORDER — PANTOPRAZOLE SODIUM 40 MG PO TBEC
40.0000 mg | DELAYED_RELEASE_TABLET | Freq: Every day | ORAL | Status: DC
  Administered 2020-01-08 – 2020-01-09 (×2): 40 mg via ORAL
  Filled 2020-01-08 (×3): qty 1

## 2020-01-08 MED ORDER — ONDANSETRON HCL 4 MG PO TABS: 4.0000 mg | ORAL_TABLET | Freq: Four times a day (QID) | ORAL | Status: DC | PRN

## 2020-01-08 MED ORDER — HYDROMORPHONE HCL 1 MG/ML IJ SOLN
0.5000 mg | INTRAMUSCULAR | Status: DC | PRN
  Administered 2020-01-08: 0.5 mg via INTRAVENOUS
  Filled 2020-01-08: qty 1

## 2020-01-08 MED ORDER — ASPIRIN 81 MG PO CHEW
81.0000 mg | CHEWABLE_TABLET | Freq: Two times a day (BID) | ORAL | Status: DC
  Administered 2020-01-08 – 2020-01-10 (×4): 81 mg via ORAL
  Filled 2020-01-08 (×4): qty 1

## 2020-01-08 MED ORDER — BUPIVACAINE IN DEXTROSE 0.75-8.25 % IT SOLN
INTRATHECAL | Status: DC | PRN
  Administered 2020-01-08: 1.6 mL via INTRATHECAL

## 2020-01-08 MED ORDER — ONDANSETRON HCL 4 MG/2ML IJ SOLN
INTRAMUSCULAR | Status: AC
  Filled 2020-01-08: qty 2

## 2020-01-08 MED ORDER — PHENYLEPHRINE HCL (PRESSORS) 10 MG/ML IV SOLN
INTRAVENOUS | Status: AC
  Filled 2020-01-08: qty 1

## 2020-01-08 MED ORDER — METOCLOPRAMIDE HCL 5 MG/ML IJ SOLN
5.0000 mg | Freq: Three times a day (TID) | INTRAMUSCULAR | Status: DC | PRN
  Administered 2020-01-09: 5 mg via INTRAVENOUS
  Filled 2020-01-08: qty 2

## 2020-01-08 MED ORDER — CEFAZOLIN SODIUM-DEXTROSE 1-4 GM/50ML-% IV SOLN
1.0000 g | Freq: Four times a day (QID) | INTRAVENOUS | Status: AC
  Administered 2020-01-08 (×2): 1 g via INTRAVENOUS
  Filled 2020-01-08 (×2): qty 50

## 2020-01-08 MED ORDER — TRANEXAMIC ACID-NACL 1000-0.7 MG/100ML-% IV SOLN
1000.0000 mg | INTRAVENOUS | Status: AC
  Administered 2020-01-08: 1000 mg via INTRAVENOUS

## 2020-01-08 MED ORDER — PROMETHAZINE HCL 25 MG/ML IJ SOLN: 6.2500 mg | INTRAMUSCULAR | Status: DC | PRN

## 2020-01-08 MED ORDER — FENTANYL CITRATE (PF) 100 MCG/2ML IJ SOLN
INTRAMUSCULAR | Status: DC | PRN
  Administered 2020-01-08: 50 ug via INTRAVENOUS

## 2020-01-08 MED ORDER — MIDAZOLAM HCL 5 MG/5ML IJ SOLN
INTRAMUSCULAR | Status: DC | PRN
  Administered 2020-01-08: 2 mg via INTRAVENOUS

## 2020-01-08 MED ORDER — HYDROMORPHONE HCL 1 MG/ML IJ SOLN: 0.2500 mg | INTRAMUSCULAR | Status: DC | PRN

## 2020-01-08 MED ORDER — OXYCODONE HCL 5 MG PO TABS
5.0000 mg | ORAL_TABLET | ORAL | Status: DC | PRN
  Administered 2020-01-08 – 2020-01-10 (×7): 10 mg via ORAL
  Filled 2020-01-08 (×7): qty 2

## 2020-01-08 MED ORDER — ACETAMINOPHEN 325 MG PO TABS
325.0000 mg | ORAL_TABLET | Freq: Four times a day (QID) | ORAL | Status: DC | PRN
  Administered 2020-01-08: 325 mg via ORAL
  Filled 2020-01-08: qty 1

## 2020-01-08 MED ORDER — MEPERIDINE HCL 50 MG/ML IJ SOLN: 6.2500 mg | INTRAMUSCULAR | Status: DC | PRN

## 2020-01-08 MED ORDER — DIPHENHYDRAMINE HCL 12.5 MG/5ML PO ELIX: 12.5000 mg | ORAL_SOLUTION | ORAL | Status: DC | PRN

## 2020-01-08 MED ORDER — MIDAZOLAM HCL 2 MG/2ML IJ SOLN
INTRAMUSCULAR | Status: AC
  Filled 2020-01-08: qty 2

## 2020-01-08 MED ORDER — METHOCARBAMOL 500 MG IVPB - SIMPLE MED
500.0000 mg | Freq: Four times a day (QID) | INTRAVENOUS | Status: DC | PRN
  Filled 2020-01-08: qty 50

## 2020-01-08 MED ORDER — ONDANSETRON HCL 4 MG/2ML IJ SOLN: 4.0000 mg | Freq: Four times a day (QID) | INTRAMUSCULAR | Status: DC | PRN

## 2020-01-08 MED ORDER — VITAMIN B-12 1000 MCG PO TABS
5000.0000 ug | ORAL_TABLET | Freq: Every day | ORAL | Status: DC
  Administered 2020-01-09: 5000 ug via ORAL
  Filled 2020-01-08 (×2): qty 5

## 2020-01-08 MED ORDER — DEXAMETHASONE SODIUM PHOSPHATE 10 MG/ML IJ SOLN
INTRAMUSCULAR | Status: DC | PRN
  Administered 2020-01-08: 10 mg via INTRAVENOUS

## 2020-01-08 MED ORDER — MENTHOL 3 MG MT LOZG: 1.0000 | LOZENGE | OROMUCOSAL | Status: DC | PRN

## 2020-01-08 MED ORDER — CEFAZOLIN SODIUM-DEXTROSE 2-4 GM/100ML-% IV SOLN
2.0000 g | INTRAVENOUS | Status: AC
  Administered 2020-01-08: 2 g via INTRAVENOUS

## 2020-01-08 MED ORDER — METOCLOPRAMIDE HCL 5 MG PO TABS: 5.0000 mg | ORAL_TABLET | Freq: Three times a day (TID) | ORAL | Status: DC | PRN

## 2020-01-08 MED ORDER — ALUM & MAG HYDROXIDE-SIMETH 200-200-20 MG/5ML PO SUSP: 30.0000 mL | ORAL | Status: DC | PRN

## 2020-01-08 MED ORDER — PROPOFOL 1000 MG/100ML IV EMUL
INTRAVENOUS | Status: AC
  Filled 2020-01-08: qty 100

## 2020-01-08 MED ORDER — LACTATED RINGERS IV SOLN: INTRAVENOUS | Status: DC

## 2020-01-08 MED ORDER — VITAMIN B-12 5000 MCG SL SUBL: 5000.0000 ug | SUBLINGUAL_TABLET | Freq: Every day | SUBLINGUAL | Status: DC

## 2020-01-08 MED ORDER — 0.9 % SODIUM CHLORIDE (POUR BTL) OPTIME
TOPICAL | Status: DC | PRN
  Administered 2020-01-08: 1000 mL

## 2020-01-08 MED ORDER — PROPOFOL 500 MG/50ML IV EMUL
INTRAVENOUS | Status: DC | PRN
  Administered 2020-01-08: 75 ug/kg/min via INTRAVENOUS

## 2020-01-08 MED ORDER — STERILE WATER FOR IRRIGATION IR SOLN
Status: DC | PRN
  Administered 2020-01-08: 2000 mL

## 2020-01-08 MED ORDER — VITAMIN D 25 MCG (1000 UNIT) PO TABS
10000.0000 [IU] | ORAL_TABLET | Freq: Every day | ORAL | Status: DC
  Administered 2020-01-09: 10000 [IU] via ORAL
  Filled 2020-01-08 (×2): qty 10

## 2020-01-08 MED ORDER — CEFAZOLIN SODIUM-DEXTROSE 2-4 GM/100ML-% IV SOLN
INTRAVENOUS | Status: AC
  Filled 2020-01-08: qty 100

## 2020-01-08 MED ORDER — DEXAMETHASONE SODIUM PHOSPHATE 10 MG/ML IJ SOLN
INTRAMUSCULAR | Status: AC
  Filled 2020-01-08: qty 1

## 2020-01-08 MED ORDER — SODIUM CHLORIDE 0.9 % IR SOLN
Status: DC | PRN
  Administered 2020-01-08: 1000 mL

## 2020-01-08 SURGICAL SUPPLY — 39 items
BAG ZIPLOCK 12X15 (MISCELLANEOUS) IMPLANT
BENZOIN TINCTURE PRP APPL 2/3 (GAUZE/BANDAGES/DRESSINGS) IMPLANT
BLADE SAW SGTL 18X1.27X75 (BLADE) ×2 IMPLANT
COVER PERINEAL POST (MISCELLANEOUS) ×2 IMPLANT
COVER SURGICAL LIGHT HANDLE (MISCELLANEOUS) ×2 IMPLANT
COVER WAND RF STERILE (DRAPES) ×2 IMPLANT
DRAPE STERI IOBAN 125X83 (DRAPES) ×2 IMPLANT
DRAPE U-SHAPE 47X51 STRL (DRAPES) ×4 IMPLANT
DRSG AQUACEL AG ADV 3.5X10 (GAUZE/BANDAGES/DRESSINGS) ×2 IMPLANT
DURAPREP 26ML APPLICATOR (WOUND CARE) ×2 IMPLANT
ELECT REM PT RETURN 15FT ADLT (MISCELLANEOUS) ×2 IMPLANT
GAUZE XEROFORM 1X8 LF (GAUZE/BANDAGES/DRESSINGS) ×2 IMPLANT
GLOVE BIO SURGEON STRL SZ7.5 (GLOVE) ×2 IMPLANT
GLOVE BIOGEL PI IND STRL 8 (GLOVE) ×2 IMPLANT
GLOVE BIOGEL PI INDICATOR 8 (GLOVE) ×2
GLOVE ECLIPSE 8.0 STRL XLNG CF (GLOVE) ×2 IMPLANT
GOWN STRL REUS W/TWL XL LVL3 (GOWN DISPOSABLE) ×4 IMPLANT
HANDPIECE INTERPULSE COAX TIP (DISPOSABLE) ×1
HEAD M SROM 36MM PLUS 1.5 (Hips) IMPLANT
HOLDER FOLEY CATH W/STRAP (MISCELLANEOUS) ×2 IMPLANT
KIT TURNOVER KIT A (KITS) IMPLANT
LINER NEUTRAL 52X36MM PLUS 4 (Liner) ×1 IMPLANT
PACK ANTERIOR HIP CUSTOM (KITS) ×2 IMPLANT
PENCIL SMOKE EVACUATOR (MISCELLANEOUS) IMPLANT
PIN SECTOR W/GRIP ACE CUP 52MM (Hips) ×1 IMPLANT
SET HNDPC FAN SPRY TIP SCT (DISPOSABLE) ×1 IMPLANT
SROM M HEAD 36MM PLUS 1.5 (Hips) ×2 IMPLANT
STAPLER VISISTAT 35W (STAPLE) IMPLANT
STEM FEM ACTIS HIGH SZ2 (Stem) ×1 IMPLANT
STRIP CLOSURE SKIN 1/2X4 (GAUZE/BANDAGES/DRESSINGS) IMPLANT
SUT ETHIBOND NAB CT1 #1 30IN (SUTURE) ×2 IMPLANT
SUT ETHILON 2 0 PS N (SUTURE) IMPLANT
SUT MNCRL AB 4-0 PS2 18 (SUTURE) IMPLANT
SUT VIC AB 0 CT1 36 (SUTURE) ×2 IMPLANT
SUT VIC AB 1 CT1 36 (SUTURE) ×2 IMPLANT
SUT VIC AB 2-0 CT1 27 (SUTURE) ×2
SUT VIC AB 2-0 CT1 TAPERPNT 27 (SUTURE) ×2 IMPLANT
TRAY FOLEY MTR SLVR 16FR STAT (SET/KITS/TRAYS/PACK) IMPLANT
YANKAUER SUCT BULB TIP 10FT TU (MISCELLANEOUS) ×2 IMPLANT

## 2020-01-08 NOTE — Anesthesia Procedure Notes (Signed)
Spinal  Patient location during procedure: OR Start time: 01/08/2020 7:17 AM End time: 01/08/2020 7:20 AM Staffing Performed: resident/CRNA  Anesthesiologist: Nolon Nations, MD Resident/CRNA: Glory Buff, CRNA Preanesthetic Checklist Completed: patient identified, IV checked, site marked, risks and benefits discussed, surgical consent, monitors and equipment checked, pre-op evaluation and timeout performed Spinal Block Patient position: sitting Prep: DuraPrep Patient monitoring: heart rate, cardiac monitor, continuous pulse ox and blood pressure Approach: midline Location: L3-4 Injection technique: single-shot Needle Needle type: Pencan  Needle gauge: 24 G Needle length: 9 cm Assessment Sensory level: T6 Additional Notes Kit expiration date checked and verified.  Sterile prep and drape, skin local with 1% lidocaine, stick x 1, - heme, - paraesthesia, + CSF pre and post injection, patient tolerated procedure well.

## 2020-01-08 NOTE — Anesthesia Postprocedure Evaluation (Signed)
Anesthesia Post Note  Patient: Kathy [name]  Procedure(s) Performed: LEFT TOTAL HIP ARTHROPLASTY ANTERIOR APPROACH (Left Hip)     Patient location during evaluation: PACU Anesthesia Type: Spinal Level of consciousness: awake and alert Pain management: pain level controlled Vital Signs Assessment: post-procedure vital signs reviewed and stable Respiratory status: spontaneous breathing Cardiovascular status: stable Anesthetic complications: no    Last Vitals:  Vitals:   01/08/20 1215 01/08/20 1314  BP: 128/79 118/62  Pulse: 67 66  Resp: 16 16  Temp: 36.4 C   SpO2: 100% 100%    Last Pain:  Vitals:   01/08/20 1301  TempSrc:   PainSc: 0-No pain                 Nolon Nations

## 2020-01-08 NOTE — Evaluation (Signed)
Physical Therapy Evaluation Patient Details Name: Kathy [name] MRN: [id number] DOB: Jan 13, 1956 Today's Date: 01/08/2020   History of Present Illness  Pt s/p L THR and with hx of R THR (12/20) and DM  Clinical Impression  Pt s/p L THR and presents with decreased L LE strength/ROM and post op pain limiting functional mobility.  Pt should progress to dc home with family assist and follow up HHPT.  This session pt ltd by increasing dizziness with ambulation - BP on return to sitting 101/72 - RN awaren    Follow Up Recommendations Home health PT;Follow surgeon's recommendation for DC plan and follow-up therapies    Equipment Recommendations  None recommended by PT    Recommendations for Other Services       Precautions / Restrictions Precautions Precautions: Fall Restrictions Weight Bearing Restrictions: No Other Position/Activity Restrictions: WBAT      Mobility  Bed Mobility Overal bed mobility: Needs Assistance Bed Mobility: Supine to Sit     Supine to sit: Min assist     General bed mobility comments: cues for sequence and use of R LE to self assist  Transfers Overall transfer level: Needs assistance Equipment used: Rolling walker (2 wheeled) Transfers: Sit to/from Stand Sit to Stand: Min assist         General transfer comment: cues for LE management and use of UEs to self assist  Ambulation/Gait Ambulation/Gait assistance: Min assist Gait Distance (Feet): 30 Feet Assistive device: Rolling walker (2 wheeled) Gait Pattern/deviations: Step-to pattern;Decreased step length - right;Decreased step length - left;Shuffle;Trunk flexed Gait velocity: decr   General Gait Details: cues for posture, position from RW and initial sequence; distance ltd by dizziness  Stairs            Wheelchair Mobility    Modified Rankin (Stroke Patients Only)       Balance Overall balance assessment: Mild deficits observed, not formally tested                                           Pertinent Vitals/Pain Pain Assessment: 0-10 Pain Score: 4  Pain Location: L hip Pain Descriptors / Indicators: Aching;Sore Pain Intervention(s): Limited activity within patient's tolerance;Monitored during session;Premedicated before session;Ice applied    Home Living Family/patient expects to be discharged to:: Private residence Living Arrangements: Other relatives Available Help at Discharge: Family Type of Home: House Home Access: Stairs to enter Entrance Stairs-Rails: Left Entrance Stairs-Number of Steps: 4 Home Layout: Able to live on main level with bedroom/bathroom Home Equipment: Walker - 2 wheels;Bedside commode Additional Comments: Pt states she is borrowing RW    Prior Function Level of Independence: Independent               Hand Dominance        Extremity/Trunk Assessment   Upper Extremity Assessment Upper Extremity Assessment: Overall WFL for tasks assessed    Lower Extremity Assessment Lower Extremity Assessment: LLE deficits/detail    Cervical / Trunk Assessment Cervical / Trunk Assessment: Normal  Communication   Communication: No difficulties  Cognition Arousal/Alertness: Awake/alert Behavior During Therapy: WFL for tasks assessed/performed Overall Cognitive Status: Within Functional Limits for tasks assessed  General Comments      Exercises Total Joint Exercises Ankle Circles/Pumps: AROM;Both;15 reps;Supine   Assessment/Plan    PT Assessment Patient needs continued PT services  PT Problem List Decreased strength;Decreased range of motion;Decreased activity tolerance;Decreased mobility;Decreased knowledge of use of DME;Pain       PT Treatment Interventions DME instruction;Gait training;Stair training;Functional mobility training;Therapeutic activities;Therapeutic exercise;Patient/family education    PT Goals (Current goals can be found in the  Care Plan section)  Acute Rehab PT Goals Patient Stated Goal: Regain IND PT Goal Formulation: With patient Time For Goal Achievement: 01/15/20 Potential to Achieve Goals: Good    Frequency 7X/week   Barriers to discharge        Co-evaluation               AM-PAC PT "6 Clicks" Mobility  Outcome Measure Help needed turning from your back to your side while in a flat bed without using bedrails?: A Little Help needed moving from lying on your back to sitting on the side of a flat bed without using bedrails?: A Little Help needed moving to and from a bed to a chair (including a wheelchair)?: A Little Help needed standing up from a chair using your arms (e.g., wheelchair or bedside chair)?: A Little Help needed to walk in hospital room?: A Little Help needed climbing 3-5 steps with a railing? : A Lot 6 Click Score: 17    End of Session Equipment Utilized During Treatment: Gait belt Activity Tolerance: Patient tolerated treatment well;Other (comment)(ltd by dizziness) Patient left: in chair;with call bell/phone within reach;with chair alarm set Nurse Communication: Mobility status PT Visit Diagnosis: Difficulty in walking, not elsewhere classified (R26.2)    Time: [id number] PT Time Calculation (min) (ACUTE ONLY): 25 min   Charges:   PT Evaluation $PT Eval Low Complexity: 1 Low PT Treatments $Gait Training: 8-22 mins        [place] Pager [phone] Office [phone]   Kathy [name] 01/08/2020, 4:08 PM

## 2020-01-08 NOTE — Anesthesia Procedure Notes (Signed)
Date/Time: 01/08/2020 7:13 AM Performed by: Glory Buff, CRNA Oxygen Delivery Method: Simple face mask

## 2020-01-08 NOTE — Brief Op Note (Signed)
01/08/2020  8:27 AM  PATIENT:  Kathy [name]  64 y.o. female  PRE-OPERATIVE DIAGNOSIS:  osteoarthritis left hip  POST-OPERATIVE DIAGNOSIS:  osteoarthritis left hip  PROCEDURE:  Procedure(s): LEFT TOTAL HIP ARTHROPLASTY ANTERIOR APPROACH (Left)  SURGEON:  Surgeon(s) and Role:    Mcarthur Rossetti, MD - Primary  PHYSICIAN ASSISTANT: Benita Stabile, PA-C  ANESTHESIA:   spinal  EBL:  250 mL   COUNTS:  YES  DICTATION: .Other Dictation: Dictation Number [id number]  PLAN OF CARE: Admit for overnight observation  PATIENT DISPOSITION:  PACU - hemodynamically stable.   Delay start of Pharmacological VTE agent (>24hrs) due to surgical blood loss or risk of bleeding: no

## 2020-01-08 NOTE — TOC Transition Note (Signed)
Transition of Care [facility]) - CM/SW Discharge Note   Patient Details  Name: Kathy [name] MRN: [id number] Date of Birth: 19-Aug-1956  [facility] CM/SW Contact:  Lennart Pall, LCSW Phone Number: 01/08/2020, 1:57 PM   Clinical Narrative:   Met briefly with to confirm d/c arrangements. Pt reports hopeful for d/c tomorrow.  She confirms [facility] arranged for follow up and has all needed DME. NO needs.   Final next level of care: [place] Barriers to Discharge: No Barriers Identified   Patient Goals and CMS Choice        Discharge Placement                       Discharge Plan and Services                DME Arranged: N/A(has all needed DME from prior surgery in Dec) DME Agency: NA       HH Arranged: PT(pre-arranged via MD office) [employer]: [facility] (now [facility])        Social Determinants of Health (SDOH) Interventions     Readmission Risk Interventions No flowsheet data found.

## 2020-01-08 NOTE — Op Note (Signed)
NAME: Kathy [name], Kathy [name] MEDICAL RECORD [id number] ACCOUNT [id number] DATE OF BIRTH:[birth year] FACILITY: [facility] LOCATION: [facility] PHYSICIAN:Williams Dietrick Kerry Fort, MD  OPERATIVE REPORT  DATE OF PROCEDURE:  01/08/2020  PREOPERATIVE DIAGNOSIS:  Primary osteoarthritis and degenerative joint disease, left hip.  POSTOPERATIVE DIAGNOSIS:  Primary osteoarthritis and degenerative joint disease, left hip.  PROCEDURE:  Left total hip arthroplasty through direct anterior approach.  IMPLANTS:  DePuy Sector Gription acetabular component size 52, size 36+4 neutral polyethylene liner, size 2 Actis femoral component with high offset, size 36+1.5 metal hip ball.  SURGEON:  Lind Guest. Ninfa Linden, MD  ASSISTANT:  Erskine Emery, PA-C  ANESTHESIA:  Spinal.  ANTIBIOTICS:  Two grams IV Ancef.  ESTIMATED BLOOD LOSS:  250 mL.  COMPLICATIONS:  None.  INDICATIONS:  The patient is a 64 year old female well known to me.  She has debilitating arthritis of both her hips.  We actually replaced her right hip in December 2020 through direct anterior approach.  She has done very well with that hip.  Her left  hip x-ray shows severe end-stage arthritis.  She does have daily left hip pain.  At this point, does wish to proceed with total hip arthroplasty on the left side and we have recommended this to her.  Having had this done before she is fully aware of the  risk of acute blood loss anemia, nerve or vessel injury, fracture, infection, dislocation, DVT and implant failure.  She understands her goals are to decrease pain, improve mobility and overall improve quality of life.  DESCRIPTION OF PROCEDURE:  After informed consent was obtained and appropriate left hip was marked she was brought to the operating room and sat up on a stretcher where spinal anesthesia was then obtained.  She was then placed in supine position on a  stretcher.  I was able to assess her leg lengths.  A Foley catheter was placed.   Preoperatively, she was just a touch shorter on her left side than the right side and hopefully we will equal that up.  She was placed supine on the Hana fracture table, the  perineal post in place and both legs in line skeletal traction device and no traction applied.  Her left operative hip was prepped and draped with DuraPrep and sterile drapes.  A timeout was called and she was identified as correct patient, correct left  hip.  I then made an incision just inferior and posterior to the anterior superior iliac spine and carried this obliquely down the leg.  We dissected down tensor fascia lata muscle.  Tensor fascia was then divided longitudinally to proceed with direct  anterior approach to the hip.  We identified and cauterized circumflex vessels and identified the hip capsule, opened the hip capsule in an L-type format finding a moderate joint effusion, and significant periarticular osteophytes around the femoral head  and neck.  We then placed Cobra retractors around the medial and lateral femoral neck and made our femoral neck cut with an oscillating saw and completed this with an osteotome.  We placed a corkscrew guide in the femoral head and removed the femoral  heads entirety and found a wide area devoid of cartilage.  I then placed a bent Hohmann over the medial acetabular rim and removed remnants of the acetabular labrum and other debris.  We then began reaming from a size 43 reamer in stepwise increments up  going up to a size 51.  With all reamers under direct visualization, the last reamer was placed under direct fluoroscopy  so I could obtain my depth of reaming, our inclination, and anteversion.  I then placed the real DePuy Sector Gription acetabular  component size 52 and a 36+4 neutral polyethylene liner based off her offset and medializing the cup.  Attention was then turned to the femur.  With the leg externally rotated to 120 degrees, extended and adducted, we are placing Mueller  retractor  medially and Hohman retractor behind the greater trochanter.  We released lateral joint capsule and used a box-cutting osteotome to enter the femoral canal and a rongeur to lateralize.  We then began broaching using the Actis broaching system from a size  0 up to a size 2.  She has very tight canal and this corresponded with her other hip as well.  With a size 2 in place, we trialed a high offset femoral neck and a 36+1.5 hip ball, reduced this in the acetabulum.  We were pleased with the leg length,  offset, range of motion and stability that was assessed radiographically and mechanically.  We then dislocated the hip and removed the trial components.  I placed the real Actis femoral component with high offset size 2 and the real 36+1.5 metal hip ball  and again reduced this in the acetabulum and appreciated the stability on exam.  We then irrigated the soft tissue with normal saline solution using pulsatile lavage.  We closed the joint capsule with interrupted #1 Ethibond suture, followed by closing  the tensor fascia with #1 Vicryl.  0 Vicryl was used to close deep tissue and 2-0 Vicryl was used to close the subcutaneous tissue.  Staples were used to close the skin.  Xeroform and Aquacel dressing was applied.  She was taken off the Hana table and  taken to recovery room in stable condition.  All final counts were correct.  There were no complications noted.  Of note, Benita Stabile, PA-C, assisted during the entire case and his assistance was crucial for facilitating all aspects of this case.  CN/NUANCE  D:01/08/2020 T:01/08/2020 JOB:[id number]

## 2020-01-08 NOTE — H&P (Signed)
The patient is here today for a left total hip arthroplasty.  There has been no acute change in her medical status.  See recent H&P.  She understands fully what the surgery involves having had a right total hip arthroplasty just this past December.  The risk and benefits of surgery were explained in detail.  The left hip has been marked and informed consent obtained.

## 2020-01-08 NOTE — Transfer of Care (Signed)
Immediate Anesthesia Transfer of Care Note  Patient: Kathy [name]  Procedure(s) Performed: LEFT TOTAL HIP ARTHROPLASTY ANTERIOR APPROACH (Left Hip)  Patient Location: PACU  Anesthesia Type:MAC and Spinal  Level of Consciousness: awake, alert  and oriented  Airway & Oxygen Therapy: Patient Spontanous Breathing and Patient connected to face mask oxygen  Post-op Assessment: Report given to RN and Post -op Vital signs reviewed and stable  Post vital signs: Reviewed and stable  Last Vitals:  Vitals Value Taken Time  BP 104/61 01/08/20 0848  Temp    Pulse 60 01/08/20 0850  Resp 17 01/08/20 0850  SpO2 100 % 01/08/20 0850  Vitals shown include unvalidated device data.  Last Pain:  Vitals:   01/08/20 0554  TempSrc: Oral      Patients Stated Pain Goal: 5 (78/24/23 5361)  Complications: No apparent anesthesia complications

## 2020-01-09 DIAGNOSIS — M1612 Unilateral primary osteoarthritis, left hip: Secondary | ICD-10-CM | POA: Diagnosis present

## 2020-01-09 DIAGNOSIS — M4692 Unspecified inflammatory spondylopathy, cervical region: Secondary | ICD-10-CM | POA: Diagnosis present

## 2020-01-09 DIAGNOSIS — Z79899 Other long term (current) drug therapy: Secondary | ICD-10-CM | POA: Diagnosis not present

## 2020-01-09 DIAGNOSIS — E785 Hyperlipidemia, unspecified: Secondary | ICD-10-CM | POA: Diagnosis present

## 2020-01-09 DIAGNOSIS — D62 Acute posthemorrhagic anemia: Secondary | ICD-10-CM | POA: Diagnosis not present

## 2020-01-09 DIAGNOSIS — I10 Essential (primary) hypertension: Secondary | ICD-10-CM | POA: Diagnosis present

## 2020-01-09 DIAGNOSIS — M48061 Spinal stenosis, lumbar region without neurogenic claudication: Secondary | ICD-10-CM | POA: Diagnosis present

## 2020-01-09 DIAGNOSIS — M25552 Pain in left hip: Secondary | ICD-10-CM | POA: Diagnosis present

## 2020-01-09 DIAGNOSIS — E1169 Type 2 diabetes mellitus with other specified complication: Secondary | ICD-10-CM | POA: Diagnosis present

## 2020-01-09 DIAGNOSIS — Z8249 Family history of ischemic heart disease and other diseases of the circulatory system: Secondary | ICD-10-CM | POA: Diagnosis not present

## 2020-01-09 DIAGNOSIS — Z96641 Presence of right artificial hip joint: Secondary | ICD-10-CM | POA: Diagnosis present

## 2020-01-09 DIAGNOSIS — Z7982 Long term (current) use of aspirin: Secondary | ICD-10-CM | POA: Diagnosis not present

## 2020-01-09 DIAGNOSIS — Z881 Allergy status to other antibiotic agents status: Secondary | ICD-10-CM | POA: Diagnosis not present

## 2020-01-09 DIAGNOSIS — Z888 Allergy status to other drugs, medicaments and biological substances status: Secondary | ICD-10-CM | POA: Diagnosis not present

## 2020-01-09 DIAGNOSIS — K219 Gastro-esophageal reflux disease without esophagitis: Secondary | ICD-10-CM | POA: Diagnosis present

## 2020-01-09 DIAGNOSIS — K449 Diaphragmatic hernia without obstruction or gangrene: Secondary | ICD-10-CM | POA: Diagnosis present

## 2020-01-09 LAB — CBC
HCT: 30.2 % — ABNORMAL LOW (ref 36.0–46.0)
Hemoglobin: 9.4 g/dL — ABNORMAL LOW (ref 12.0–15.0)
MCH: 28.1 pg (ref 26.0–34.0)
MCHC: 31.1 g/dL (ref 30.0–36.0)
MCV: 90.4 fL (ref 80.0–100.0)
Platelets: 251 10*3/uL (ref 150–400)
RBC: 3.34 MIL/uL — ABNORMAL LOW (ref 3.87–5.11)
RDW: 13.6 % (ref 11.5–15.5)
WBC: 7.6 10*3/uL (ref 4.0–10.5)
nRBC: 0 % (ref 0.0–0.2)

## 2020-01-09 LAB — BASIC METABOLIC PANEL
Anion gap: 3 — ABNORMAL LOW (ref 5–15)
BUN: 19 mg/dL (ref 8–23)
CO2: 27 mmol/L (ref 22–32)
Calcium: 8.5 mg/dL — ABNORMAL LOW (ref 8.9–10.3)
Chloride: 107 mmol/L (ref 98–111)
Creatinine, Ser: 0.71 mg/dL (ref 0.44–1.00)
GFR calc Af Amer: >60 mL/min (ref >60)
GFR calc non Af Amer: >60 mL/min (ref >60)
Glucose, Bld: 141 mg/dL — ABNORMAL HIGH (ref 70–99)
Potassium: 3.9 mmol/L (ref 3.5–5.1)
Sodium: 137 mmol/L (ref 135–145)

## 2020-01-09 NOTE — Progress Notes (Signed)
Physical Therapy Treatment Patient Details Name: Kathy [name] MRN: [id number] DOB: 13-Jan-1956 Today's Date: 01/09/2020    History of Present Illness Pt s/p L THR and with hx of R THR (12/20) and DM    PT Comments    Pt motivated to progress but limited this am by N/V.  This session, pt tolerated up to [facility] and initiation of HEP.   Follow Up Recommendations  Home health PT;Follow surgeon's recommendation for DC plan and follow-up therapies     Equipment Recommendations  None recommended by PT    Recommendations for Other Services       Precautions / Restrictions Precautions Precautions: Fall Restrictions Weight Bearing Restrictions: No Other Position/Activity Restrictions: WBAT    Mobility  Bed Mobility Overal bed mobility: Needs Assistance Bed Mobility: Supine to Sit     Supine to sit: Min assist;Mod assist     General bed mobility comments: cues for sequence and use of R LE to self assist  Transfers Overall transfer level: Needs assistance Equipment used: Rolling walker (2 wheeled) Transfers: Sit to/from [facility] Sit to Stand: Min assist;Mod assist Stand pivot transfers: Min assist;Mod assist       General transfer comment: cues for LE management and use of UEs to self assist; stand/pvt with RW bed to [facility]  Ambulation/Gait Ambulation/Gait assistance: Min assist Gait Distance (Feet): 5 Feet Assistive device: Rolling walker (2 wheeled) Gait Pattern/deviations: Step-to pattern;Decreased step length - right;Decreased step length - left;Shuffle;Trunk flexed Gait velocity: decr   General Gait Details: cues for posture, position from RW and initial sequence; distance ltd by nausea and vomiting   Stairs             Wheelchair Mobility    Modified Rankin (Stroke Patients Only)       Balance Overall balance assessment: Mild deficits observed, not formally tested                                           Cognition Arousal/Alertness: Awake/alert Behavior During Therapy: WFL for tasks assessed/performed Overall Cognitive Status: Within Functional Limits for tasks assessed                                        Exercises Total Joint Exercises Ankle Circles/Pumps: AROM;Both;15 reps;Supine Quad Sets: AROM;Both;10 reps;Supine Heel Slides: AAROM;Left;15 reps;Supine Hip ABduction/ADduction: AAROM;Left;15 reps;Supine    General Comments        Pertinent Vitals/Pain Pain Assessment: 0-10 Pain Score: 5  Pain Location: L hip Pain Descriptors / Indicators: Aching;Sore Pain Intervention(s): Limited activity within patient's tolerance;Monitored during session;Premedicated before session;Ice applied    Home Living                      Prior Function            PT Goals (current goals can now be found in the care plan section) Acute Rehab PT Goals Patient Stated Goal: Regain IND PT Goal Formulation: With patient Time For Goal Achievement: 01/15/20 Potential to Achieve Goals: Good Progress towards PT goals: Not progressing toward goals - comment(N/V)    Frequency    7X/week      PT Plan Current plan remains appropriate    Co-evaluation  AM-PAC PT "6 Clicks" Mobility   Outcome Measure  Help needed turning from your back to your side while in a flat bed without using bedrails?: A Little Help needed moving from lying on your back to sitting on the side of a flat bed without using bedrails?: A Little Help needed moving to and from a bed to a chair (including a wheelchair)?: A Little Help needed standing up from a chair using your arms (e.g., wheelchair or bedside chair)?: A Little Help needed to walk in hospital room?: A Little Help needed climbing 3-5 steps with a railing? : A Lot 6 Click Score: 17    End of Session Equipment Utilized During Treatment: Gait belt Activity Tolerance: Other (comment)(N/V) Patient left: in chair;with  call bell/phone within reach;with chair alarm set Nurse Communication: Mobility status PT Visit Diagnosis: Difficulty in walking, not elsewhere classified (R26.2)     Time: [id number] PT Time Calculation (min) (ACUTE ONLY): 34 min  Charges:  $Therapeutic Exercise: 8-22 mins $Therapeutic Activity: 8-22 mins                     [place] Pager [phone] Office [phone]    Kathy [name] 01/09/2020, 3:41 PM

## 2020-01-09 NOTE — Progress Notes (Signed)
Physical Therapy Treatment Patient Details Name: Kathy [name] MRN: [id number] DOB: Jun 04, 1956 Today's Date: 01/09/2020    History of Present Illness Pt s/p L THR and with hx of R THR (12/20) and DM    PT Comments    Pt continues very cooperative and tolerated ambulating short distance in hall with no c/o nausea but ltd by fatigue.   Follow Up Recommendations  Home health PT;Follow surgeon's recommendation for DC plan and follow-up therapies     Equipment Recommendations  None recommended by PT    Recommendations for Other Services       Precautions / Restrictions Precautions Precautions: Fall Restrictions Weight Bearing Restrictions: No Other Position/Activity Restrictions: WBAT    Mobility  Bed Mobility Overal bed mobility: Needs Assistance Bed Mobility: Sit to Supine     Supine to sit: Min assist;Mod assist Sit to supine: Min assist;Mod assist   General bed mobility comments: cues for sequence and use of R LE to self assist; Physical assist to manage L LE and to control trunk  Transfers Overall transfer level: Needs assistance Equipment used: Rolling walker (2 wheeled) Transfers: Sit to/from [facility] Sit to Stand: Min assist;Mod assist Stand pivot transfers: Min assist;Mod assist       General transfer comment: cues for LE management and use of UEs to self assist; stand/pvt with RW recliner to [facility]  Ambulation/Gait Ambulation/Gait assistance: Min assist Gait Distance (Feet): 32 Feet Assistive device: Rolling walker (2 wheeled) Gait Pattern/deviations: Step-to pattern;Decreased step length - right;Decreased step length - left;Shuffle;Trunk flexed Gait velocity: decr   General Gait Details: cues for posture, position from RW and initial sequence; distance ltd by fatigue   Stairs             Wheelchair Mobility    Modified Rankin (Stroke Patients Only)       Balance Overall balance assessment: Mild deficits observed, not  formally tested                                          Cognition Arousal/Alertness: Awake/alert Behavior During Therapy: WFL for tasks assessed/performed Overall Cognitive Status: Within Functional Limits for tasks assessed                                        Exercises Total Joint Exercises Ankle Circles/Pumps: AROM;Both;15 reps;Supine Quad Sets: AROM;Both;10 reps;Supine Heel Slides: AAROM;Left;15 reps;Supine Hip ABduction/ADduction: AAROM;Left;15 reps;Supine    General Comments        Pertinent Vitals/Pain Pain Assessment: 0-10 Pain Score: 6  Pain Location: L hip/thigh Pain Descriptors / Indicators: Aching;Sore;Burning Pain Intervention(s): Limited activity within patient's tolerance;Monitored during session;Premedicated before session;Ice applied    Home Living                      Prior Function            PT Goals (current goals can now be found in the care plan section) Acute Rehab PT Goals Patient Stated Goal: Regain IND PT Goal Formulation: With patient Time For Goal Achievement: 01/15/20 Potential to Achieve Goals: Good Progress towards PT goals: Progressing toward goals    Frequency    7X/week      PT Plan Current plan remains appropriate    Co-evaluation  AM-PAC PT "6 Clicks" Mobility   Outcome Measure  Help needed turning from your back to your side while in a flat bed without using bedrails?: A Little Help needed moving from lying on your back to sitting on the side of a flat bed without using bedrails?: A Little Help needed moving to and from a bed to a chair (including a wheelchair)?: A Little Help needed standing up from a chair using your arms (e.g., wheelchair or bedside chair)?: A Little Help needed to walk in hospital room?: A Little Help needed climbing 3-5 steps with a railing? : A Lot 6 Click Score: 17    End of Session Equipment Utilized During Treatment: Gait  belt Activity Tolerance: Patient limited by fatigue Patient left: in bed;with call bell/phone within reach;with bed alarm set Nurse Communication: Mobility status PT Visit Diagnosis: Difficulty in walking, not elsewhere classified (R26.2)     Time: [id number] PT Time Calculation (min) (ACUTE ONLY): 26 min  Charges:  $Gait Training: 8-22 mins $Therapeutic Exercise: 8-22 mins $Therapeutic Activity: 8-22 mins                     Debe Coder PT Acute Rehabilitation Services Pager [phone] Office [phone]    Jc Veron 01/09/2020, 3:48 PM

## 2020-01-09 NOTE — Progress Notes (Signed)
Physical Therapy Treatment Patient Details Name: Kathy [name] MRN: [id number] DOB: [birth year] Today's Date: 01/09/2020    History of Present Illness Pt s/p L THR and with hx of R THR (12/20) and DM    PT Comments    Pt continues motivated but ltd this pm by fatigue and headache.  Pt tolerated up to bathroom for toileting with hand hygiene at sink and back to bed.     Follow Up Recommendations  Home health PT;Follow surgeon's recommendation for DC plan and follow-up therapies     Equipment Recommendations  None recommended by PT    Recommendations for Other Services       Precautions / Restrictions Precautions Precautions: Fall Restrictions Weight Bearing Restrictions: No Other Position/Activity Restrictions: WBAT    Mobility  Bed Mobility Overal bed mobility: Needs Assistance Bed Mobility: Supine to Sit;Sit to Supine     Supine to sit: Min assist;Mod assist Sit to supine: Min assist;Mod assist   General bed mobility comments: cues for sequence and use of R LE to self assist; Physical assist to manage L LE and to control trunk  Transfers Overall transfer level: Needs assistance Equipment used: Rolling walker (2 wheeled) Transfers: Sit to/from Stand Sit to Stand: Min assist Stand pivot transfers: Min assist;Mod assist       General transfer comment: cues for LE management and use of UEs to self assist  Ambulation/Gait Ambulation/Gait assistance: Min assist Gait Distance (Feet): 16 Feet(16' twice to/from bathroom) Assistive device: Rolling walker (2 wheeled) Gait Pattern/deviations: Step-to pattern;Decreased step length - right;Decreased step length - left;Shuffle;Trunk flexed Gait velocity: decr   General Gait Details: cues for posture, position from RW and initial sequence; distance ltd by fatigue   Stairs             Wheelchair Mobility    Modified Rankin (Stroke Patients Only)       Balance Overall balance assessment: Mild deficits  observed, not formally tested                                          Cognition Arousal/Alertness: Awake/alert Behavior During Therapy: WFL for tasks assessed/performed Overall Cognitive Status: Within Functional Limits for tasks assessed                                        Exercises      General Comments        Pertinent Vitals/Pain Pain Assessment: 0-10 Pain Score: 5  Pain Location: L hip/thigh Pain Descriptors / Indicators: Aching;Sore;Burning Pain Intervention(s): Limited activity within patient's tolerance;Monitored during session;Premedicated before session;Ice applied    Home Living                      Prior Function            PT Goals (current goals can now be found in the care plan section) Acute Rehab PT Goals Patient Stated Goal: Regain IND PT Goal Formulation: With patient Time For Goal Achievement: 01/15/20 Potential to Achieve Goals: Good Progress towards PT goals: Progressing toward goals    Frequency    7X/week      PT Plan Current plan remains appropriate    Co-evaluation              AM-PAC  PT "6 Clicks" Mobility   Outcome Measure  Help needed turning from your back to your side while in a flat bed without using bedrails?: A Little Help needed moving from lying on your back to sitting on the side of a flat bed without using bedrails?: A Little Help needed moving to and from a bed to a chair (including a wheelchair)?: A Little Help needed standing up from a chair using your arms (e.g., wheelchair or bedside chair)?: A Little Help needed to walk in hospital room?: A Little Help needed climbing 3-5 steps with a railing? : A Lot 6 Click Score: 17    End of Session Equipment Utilized During Treatment: Gait belt Activity Tolerance: Patient limited by fatigue Patient left: in bed;with call bell/phone within reach;with bed alarm set Nurse Communication: Mobility status PT Visit  Diagnosis: Difficulty in walking, not elsewhere classified (R26.2)     Time: 1455-1520 PT Time Calculation (min) (ACUTE ONLY): 25 min  Charges:  $Gait Training: 8-22 mins $Therapeutic Activity: 8-22 mins                     [place] Pager [phone] Office [phone]    Brietta Manso 01/09/2020, 3:53 PM

## 2020-01-09 NOTE — Discharge Instructions (Signed)

## 2020-01-09 NOTE — Progress Notes (Signed)
Subjective: 1 Day Post-Op Procedure(s) (LRB): LEFT TOTAL HIP ARTHROPLASTY ANTERIOR APPROACH (Left) Patient reports pain as moderate.  Does have nausea.  Acute blood loss anemia from her surgery as well.  Objective: Vital signs in last 24 hours: Temp:  [97.4 F (36.3 C)-98.4 F (36.9 C)] 98.4 F (36.9 C) (05/15 0629) Pulse Rate:  [46-78] 78 (05/15 0629) Resp:  [13-26] 18 (05/15 0629) BP: (95-128)/(56-79) 102/56 (05/15 0629) SpO2:  [98 %-100 %] 100 % (05/15 0629) Weight:  [81.2 kg] 81.2 kg (05/14 1115)  Intake/Output from previous day: 05/14 0701 - 05/15 0700 In: 3682.5 [P.O.:420; I.V.:3062.5; IV Piggyback:200] Out: 2175 [Urine:1925; Blood:250] Intake/Output this shift: No intake/output data recorded.  Recent Labs    01/09/20 0306  HGB 9.4*   Recent Labs    01/09/20 0306  WBC 7.6  RBC 3.34*  HCT 30.2*  PLT 251   Recent Labs    01/09/20 0306  NA 137  K 3.9  CL 107  CO2 27  BUN 19  CREATININE 0.71  GLUCOSE 141*  CALCIUM 8.5*   No results for input(s): LABPT, INR in the last 72 hours.  Sensation intact distally Intact pulses distally Dorsiflexion/Plantar flexion intact Incision: scant drainage   Assessment/Plan: 1 Day Post-Op Procedure(s) (LRB): LEFT TOTAL HIP ARTHROPLASTY ANTERIOR APPROACH (Left) Up with therapy Plan for discharge tomorrow Discharge home with home health  Need to keep her today due to her nausea, pain, and acute blood loss anemia.    Mcarthur Rossetti 01/09/2020, 8:43 AM

## 2020-01-10 LAB — CBC
HCT: 28.3 % — ABNORMAL LOW (ref 36.0–46.0)
Hemoglobin: 9 g/dL — ABNORMAL LOW (ref 12.0–15.0)
MCH: 28.4 pg (ref 26.0–34.0)
MCHC: 31.8 g/dL (ref 30.0–36.0)
MCV: 89.3 fL (ref 80.0–100.0)
Platelets: 220 10*3/uL (ref 150–400)
RBC: 3.17 MIL/uL — ABNORMAL LOW (ref 3.87–5.11)
RDW: 13.8 % (ref 11.5–15.5)
WBC: 7.5 10*3/uL (ref 4.0–10.5)
nRBC: 0 % (ref 0.0–0.2)

## 2020-01-10 MED ORDER — ASPIRIN 81 MG PO CHEW: 81.0000 mg | CHEWABLE_TABLET | Freq: Two times a day (BID) | ORAL | 0 refills | Status: DC

## 2020-01-10 NOTE — Discharge Summary (Signed)
Patient ID: Kathy [name] MRN: [id number] DOB/AGE: [birth year] 64 y.o.  Admit date: 01/08/2020 Discharge date: 01/10/2020  Admission Diagnoses:  Principal Problem:   Unilateral primary osteoarthritis, left hip Active Problems:   Status post total replacement of left hip   Discharge Diagnoses:  Same  Past Medical History:  Diagnosis Date  . Arthritis    mild  . Diabetes mellitus without complication ([place])    Type II,per PCP Dr. Derrel Nip after visit [facility]  diet controlled  . GERD (gastroesophageal reflux disease)    occasional  . History of hiatal hernia   . Hypertension   . Wears contact lenses     Surgeries: Procedure(s): LEFT TOTAL HIP ARTHROPLASTY ANTERIOR APPROACH on 01/08/2020   Consultants:   Discharged Condition: Improved  Hospital Course: Kathy [name] is an 64 y.o. female who was admitted 01/08/2020 for operative treatment ofUnilateral primary osteoarthritis, left hip. Patient has severe unremitting pain that affects sleep, daily activities, and work/hobbies. After pre-op clearance the patient was taken to the operating room on 01/08/2020 and underwent  Procedure(s): LEFT TOTAL HIP ARTHROPLASTY ANTERIOR APPROACH.    Patient was given perioperative antibiotics:  Anti-infectives (From admission, onward)   Start     Dose/Rate Route Frequency Ordered Stop   01/08/20 1330  ceFAZolin (ANCEF) IVPB 1 g/50 mL premix     1 g 100 mL/hr over 30 Minutes Intravenous Every 6 hours 01/08/20 1121 01/08/20 1908   01/08/20 0600  ceFAZolin (ANCEF) IVPB 2g/100 mL premix     2 g 200 mL/hr over 30 Minutes Intravenous On call to O.R. 01/08/20 0536 01/08/20 0751   01/08/20 0540  ceFAZolin (ANCEF) 2-4 GM/100ML-% IVPB    Note to Pharmacy: Randa Evens  : cabinet override      01/08/20 0540 01/08/20 0728       Patient was given sequential compression devices, early ambulation, and chemoprophylaxis to prevent DVT.  Patient benefited maximally from hospital stay and there were no  complications.    Recent vital signs:  Patient Vitals for the past 24 hrs:  BP Temp Temp src Pulse Resp SpO2  01/10/20 0619 123/62 98.8 F (37.1 C) Oral 87 18 98 %  01/09/20 2149 116/70 99.3 F (37.4 C) Oral 87 18 99 %  01/09/20 1440 (!) 105/59 99 F (37.2 C) -- 76 18 99 %  01/09/20 0953 104/60 98.7 F (37.1 C) -- 72 20 100 %     Recent laboratory studies:  Recent Labs    01/09/20 0306 01/10/20 0338  WBC 7.6 7.5  HGB 9.4* 9.0*  HCT 30.2* 28.3*  PLT 251 220  NA 137  --   K 3.9  --   CL 107  --   CO2 27  --   BUN 19  --   CREATININE 0.71  --   GLUCOSE 141*  --   CALCIUM 8.5*  --      Discharge Medications:   Allergies as of 01/10/2020      Reactions   Neosporin [neomycin-bacitracin Zn-polymyx] Rash      Medication List    TAKE these medications   ADULT ONE DAILY GUMMIES PO Take 2 tablets by mouth daily. Alive Women 50+   Apple Cider Vinegar 300 MG Tabs Take 300 mg by mouth daily.   aspirin 81 MG chewable tablet Chew 1 tablet (81 mg total) by mouth 2 (two) times daily. What changed: when to take this   Krill Oil 500 MG Caps Take 500 mg by mouth daily.  MAGNESIUM GLUCONATE PO Take 800 mg by mouth daily.   Red Yeast Rice Extract 600 MG Caps Take 1 capsule (600 mg total) by mouth 2 (two) times daily at 10 AM and 5 PM. What changed:   how much to take  when to take this   SM Cranberry 300 MG tablet Generic drug: Cranberry Take 300 mg by mouth daily.   telmisartan 20 MG tablet Commonly known as: MICARDIS Take 1 tablet (20 mg total) by mouth at bedtime.   Vitamin B-12 5000 MCG Subl Take 5,000 mcg by mouth daily.   vitamin C 1000 MG tablet Take 1,000 mg by mouth daily.   Vitamin D3 250 MCG (10000 UT) capsule Take 10,000 Units by mouth daily.            Durable Medical Equipment  (From admission, onward)         Start     Ordered   01/08/20 1121  DME 3 n 1  Once     01/08/20 1121   01/08/20 1121  DME Walker rolling  Once     Question Answer Comment  Walker: With 5 Inch Wheels   Patient needs a walker to treat with the following condition Status post total replacement of left hip      01/08/20 1121          Diagnostic Studies: DG Pelvis Portable  Result Date: 01/08/2020 CLINICAL DATA:  Post LEFT hip replacement EXAM: PORTABLE PELVIS 1-2 VIEWS COMPARISON:  Portable exam 0907 hours compared to 12/03/2019 FINDINGS: BILATERAL hip prostheses identified, new on LEFT. Osseous demineralization. Scattered pelvic phleboliths. No fracture, dislocation or bone destruction. Soft tissue changes of the lateral LEFT hip region with overlying skin clips. IMPRESSION: LEFT hip prosthesis without acute complication. Old RIGHT hip prosthesis. Electronically Signed   By: Lavonia Dana M.D.   On: 01/08/2020 10:51   DG C-Arm 1-60 Min-No Report  Result Date: 01/08/2020 Fluoroscopy was utilized by the requesting physician.  No radiographic interpretation.   DG HIP OPERATIVE UNILAT W OR W/O PELVIS LEFT  Result Date: 01/08/2020 CLINICAL DATA:  LEFT anterior hip replacement EXAM: OPERATIVE LEFT HIP (WITH PELVIS IF PERFORMED) 6 VIEWS TECHNIQUE: Fluoroscopic spot image(s) were submitted for interpretation post-operatively. COMPARISON:  12/03/2019 FLUOROSCOPY TIME:  0 minutes 29 seconds FINDINGS: Prior RIGHT hip replacement. Osteoarthritic changes LEFT hip and osseous demineralization noted. Images demonstrate placement of a LEFT hip prosthesis without acute fracture or dislocation. IMPRESSION: New LEFT hip prosthesis without acute fracture or dislocation. Prior RIGHT hip arthroplasty. Electronically Signed   By: Lavonia Dana M.D.   On: 01/08/2020 10:50    Disposition: Discharge disposition: 01-Home or [place]    Mcarthur Rossetti, MD Follow up in 2 week(s).   Specialty: Orthopedic Surgery Contact information: [address] [place] [address] [phone]             Signed: Mcarthur Rossetti 01/10/2020, 8:18 AM

## 2020-01-10 NOTE — Progress Notes (Signed)
Patient ID: Kathy [name], female   DOB: [birth year], 64 y.o.   MRN: [id number] Feels better overall today.  Can be discharged to home.  Left hip stable.  Vitals stable.

## 2020-01-10 NOTE — Progress Notes (Signed)
Physical Therapy Treatment Patient Details Name: Kathy [name] MRN: [id number] DOB: January 27, 1956 Today's Date: 01/10/2020    History of Present Illness Pt s/p L THR and with hx of R THR (12/20) and DM    PT Comments    Marked improvement in activity tolerance with no c/o fatigue, dizziness or nausea.  Pt progressing well with mobility   Follow Up Recommendations  Home health PT;Follow surgeon's recommendation for DC plan and follow-up therapies     Equipment Recommendations  None recommended by PT    Recommendations for Other Services       Precautions / Restrictions Precautions Precautions: Fall Restrictions Weight Bearing Restrictions: No Other Position/Activity Restrictions: WBAT    Mobility  Bed Mobility Overal bed mobility: Needs Assistance Bed Mobility: Supine to Sit;Sit to Supine     Supine to sit: Min guard Sit to supine: Min guard   General bed mobility comments: cues for sequence and use of R LE to self assist; Pt self assisting L LE with belt  Transfers Overall transfer level: Needs assistance Equipment used: Rolling walker (2 wheeled) Transfers: Sit to/from Stand Sit to Stand: Min guard         General transfer comment: cues for LE management and use of UEs to self assist  Ambulation/Gait Ambulation/Gait assistance: Min guard Gait Distance (Feet): 180 Feet Assistive device: Rolling walker (2 wheeled) Gait Pattern/deviations: Step-to pattern;Step-through pattern;Decreased step length - right;Decreased step length - left;Shuffle;Trunk flexed     General Gait Details: cues for posture, position from RW and initial sequence;    Stairs             Wheelchair Mobility    Modified Rankin (Stroke Patients Only)       Balance Overall balance assessment: Mild deficits observed, not formally tested                                          Cognition Arousal/Alertness: Awake/alert Behavior During Therapy: WFL for tasks  assessed/performed Overall Cognitive Status: Within Functional Limits for tasks assessed                                        Exercises Total Joint Exercises Ankle Circles/Pumps: AROM;Both;15 reps;Supine Quad Sets: AROM;Both;10 reps;Supine Heel Slides: AAROM;Left;Supine;20 reps Hip ABduction/ADduction: AAROM;Left;15 reps;Supine Long Arc Quad: AAROM;Left;15 reps;Seated    General Comments        Pertinent Vitals/Pain Pain Assessment: 0-10 Pain Score: 2  Pain Location: L hip/thigh Pain Descriptors / Indicators: Aching;Sore;Burning Pain Intervention(s): Limited activity within patient's tolerance;Monitored during session;Premedicated before session;Ice applied    Home Living                      Prior Function            PT Goals (current goals can now be found in the care plan section) Acute Rehab PT Goals Patient Stated Goal: Regain IND PT Goal Formulation: With patient Time For Goal Achievement: 01/15/20 Potential to Achieve Goals: Good Progress towards PT goals: Progressing toward goals    Frequency    7X/week      PT Plan Current plan remains appropriate    Co-evaluation              AM-PAC PT "6 Clicks" Mobility  Outcome Measure  Help needed turning from your back to your side while in a flat bed without using bedrails?: A Little Help needed moving from lying on your back to sitting on the side of a flat bed without using bedrails?: A Little Help needed moving to and from a bed to a chair (including a wheelchair)?: A Little Help needed standing up from a chair using your arms (e.g., wheelchair or bedside chair)?: A Little Help needed to walk in hospital room?: A Little Help needed climbing 3-5 steps with a railing? : A Lot 6 Click Score: 17    End of Session Equipment Utilized During Treatment: Gait belt Activity Tolerance: Patient limited by fatigue Patient left: in chair;with call bell/phone within reach;with chair  alarm set Nurse Communication: Mobility status PT Visit Diagnosis: Difficulty in walking, not elsewhere classified (R26.2)     Time: 0830-0900 PT Time Calculation (min) (ACUTE ONLY): 30 min  Charges:  $Gait Training: 8-22 mins $Therapeutic Exercise: 8-22 mins                     [place] Pager [phone] Office [phone]    [name] 01/10/2020, 11:11 AM

## 2020-01-10 NOTE — Progress Notes (Signed)
Physical Therapy Treatment Patient Details Name: Kathy [name] MRN: [id number] DOB: 13-May-1956 Today's Date: 01/10/2020    History of Present Illness Pt s/p L THR and with hx of R THR (12/20) and DM    PT Comments    Pt continues to progress well and eager for dc home.  Pt ambulated in hall, negotiated stairs and up to bathroom for toileting and hygiene.   Follow Up Recommendations  Home health PT;Follow surgeon's recommendation for DC plan and follow-up therapies     Equipment Recommendations  None recommended by PT    Recommendations for Other Services       Precautions / Restrictions Precautions Precautions: Fall Restrictions Weight Bearing Restrictions: No Other Position/Activity Restrictions: WBAT    Mobility  Bed Mobility Overal bed mobility: Needs Assistance Bed Mobility: Supine to Sit;Sit to Supine     Supine to sit: Min guard Sit to supine: Min guard   General bed mobility comments: cues for sequence and use of R LE to self assist; Pt self assisting L LE with belt  Transfers Overall transfer level: Needs assistance Equipment used: Rolling walker (2 wheeled) Transfers: Sit to/from Stand Sit to Stand: Min guard;Supervision         General transfer comment: cues for LE management and use of UEs to self assist  Ambulation/Gait Ambulation/Gait assistance: Min guard;Supervision Gait Distance (Feet): 100 Feet(and 15' twice to/from bathroom) Assistive device: Rolling walker (2 wheeled) Gait Pattern/deviations: Step-to pattern;Step-through pattern;Decreased step length - right;Decreased step length - left;Shuffle;Trunk flexed Gait velocity: decr   General Gait Details: cues for posture, position from RW and initial sequence;    Stairs Stairs: Yes Stairs assistance: Min assist Stair Management: No rails;One rail Left;Step to pattern;Forwards;With walker;With cane Number of Stairs: 7 General stair comments: single step twice bkwd to access high bed  using stool; 5 steps with rail and cane; cues for sequence and foot/AD placement   Wheelchair Mobility    Modified Rankin (Stroke Patients Only)       Balance Overall balance assessment: Mild deficits observed, not formally tested                                          Cognition Arousal/Alertness: Awake/alert Behavior During Therapy: WFL for tasks assessed/performed Overall Cognitive Status: Within Functional Limits for tasks assessed                                        Exercises Total Joint Exercises Ankle Circles/Pumps: AROM;Both;15 reps;Supine Quad Sets: AROM;Both;10 reps;Supine Heel Slides: AAROM;Left;Supine;20 reps Hip ABduction/ADduction: AAROM;Left;15 reps;Supine Long Arc Quad: AAROM;Left;15 reps;Seated    General Comments        Pertinent Vitals/Pain Pain Assessment: 0-10 Pain Score: 2  Pain Location: L hip/thigh Pain Descriptors / Indicators: Aching;Sore;Burning Pain Intervention(s): Limited activity within patient's tolerance;Monitored during session;Premedicated before session;Ice applied    Home Living                      Prior Function            PT Goals (current goals can now be found in the care plan section) Acute Rehab PT Goals Patient Stated Goal: Regain IND PT Goal Formulation: With patient Time For Goal Achievement: 01/15/20 Potential to Achieve Goals: Good Progress towards  PT goals: Progressing toward goals    Frequency    7X/week      PT Plan Current plan remains appropriate    Co-evaluation              AM-PAC PT "6 Clicks" Mobility   Outcome Measure  Help needed turning from your back to your side while in a flat bed without using bedrails?: A Little Help needed moving from lying on your back to sitting on the side of a flat bed without using bedrails?: A Little Help needed moving to and from a bed to a chair (including a wheelchair)?: A Little Help needed standing  up from a chair using your arms (e.g., wheelchair or bedside chair)?: A Little Help needed to walk in hospital room?: A Little Help needed climbing 3-5 steps with a railing? : A Little 6 Click Score: 18    End of Session Equipment Utilized During Treatment: Gait belt Activity Tolerance: Patient limited by fatigue;Patient tolerated treatment well Patient left: in chair;with call bell/phone within reach;with chair alarm set Nurse Communication: Mobility status PT Visit Diagnosis: Difficulty in walking, not elsewhere classified (R26.2)     Time: [id number] PT Time Calculation (min) (ACUTE ONLY): 25 min  Charges:  $Gait Training: 8-22 mins $Therapeutic Exercise: 8-22 mins $Therapeutic Activity: 8-22 mins                     [place] Pager [phone] Office [phone]    Chong Wojdyla 01/10/2020, 11:15 AM

## 2020-01-11 ENCOUNTER — Encounter: Payer: Self-pay | Admitting: *Deleted

## 2020-01-11 DIAGNOSIS — I10 Essential (primary) hypertension: Secondary | ICD-10-CM | POA: Diagnosis not present

## 2020-01-11 DIAGNOSIS — Z96642 Presence of left artificial hip joint: Secondary | ICD-10-CM | POA: Diagnosis not present

## 2020-01-11 DIAGNOSIS — M1611 Unilateral primary osteoarthritis, right hip: Secondary | ICD-10-CM | POA: Diagnosis not present

## 2020-01-11 DIAGNOSIS — K219 Gastro-esophageal reflux disease without esophagitis: Secondary | ICD-10-CM | POA: Diagnosis not present

## 2020-01-11 DIAGNOSIS — M47812 Spondylosis without myelopathy or radiculopathy, cervical region: Secondary | ICD-10-CM | POA: Diagnosis not present

## 2020-01-11 DIAGNOSIS — E1169 Type 2 diabetes mellitus with other specified complication: Secondary | ICD-10-CM | POA: Diagnosis not present

## 2020-01-11 DIAGNOSIS — E785 Hyperlipidemia, unspecified: Secondary | ICD-10-CM | POA: Diagnosis not present

## 2020-01-11 DIAGNOSIS — M48061 Spinal stenosis, lumbar region without neurogenic claudication: Secondary | ICD-10-CM | POA: Diagnosis not present

## 2020-01-11 DIAGNOSIS — Z471 Aftercare following joint replacement surgery: Secondary | ICD-10-CM | POA: Diagnosis not present

## 2020-01-12 ENCOUNTER — Telehealth: Payer: Self-pay | Admitting: Orthopaedic Surgery

## 2020-01-12 NOTE — Telephone Encounter (Signed)
[place]  [place] from [employer] called requesting verbal orders. Asking physical therapy  3 week 1 and 2 week 1. [place] phone number is [phone] if any question or concerns.

## 2020-01-13 NOTE — Telephone Encounter (Signed)
Verbal order given  

## 2020-01-14 DIAGNOSIS — I10 Essential (primary) hypertension: Secondary | ICD-10-CM | POA: Diagnosis not present

## 2020-01-14 DIAGNOSIS — E785 Hyperlipidemia, unspecified: Secondary | ICD-10-CM | POA: Diagnosis not present

## 2020-01-14 DIAGNOSIS — M1611 Unilateral primary osteoarthritis, right hip: Secondary | ICD-10-CM | POA: Diagnosis not present

## 2020-01-14 DIAGNOSIS — Z96642 Presence of left artificial hip joint: Secondary | ICD-10-CM | POA: Diagnosis not present

## 2020-01-14 DIAGNOSIS — M48061 Spinal stenosis, lumbar region without neurogenic claudication: Secondary | ICD-10-CM | POA: Diagnosis not present

## 2020-01-14 DIAGNOSIS — M47812 Spondylosis without myelopathy or radiculopathy, cervical region: Secondary | ICD-10-CM | POA: Diagnosis not present

## 2020-01-14 DIAGNOSIS — E1169 Type 2 diabetes mellitus with other specified complication: Secondary | ICD-10-CM | POA: Diagnosis not present

## 2020-01-14 DIAGNOSIS — Z471 Aftercare following joint replacement surgery: Secondary | ICD-10-CM | POA: Diagnosis not present

## 2020-01-14 DIAGNOSIS — K219 Gastro-esophageal reflux disease without esophagitis: Secondary | ICD-10-CM | POA: Diagnosis not present

## 2020-01-15 DIAGNOSIS — Z471 Aftercare following joint replacement surgery: Secondary | ICD-10-CM | POA: Diagnosis not present

## 2020-01-15 DIAGNOSIS — E785 Hyperlipidemia, unspecified: Secondary | ICD-10-CM | POA: Diagnosis not present

## 2020-01-15 DIAGNOSIS — E1169 Type 2 diabetes mellitus with other specified complication: Secondary | ICD-10-CM | POA: Diagnosis not present

## 2020-01-15 DIAGNOSIS — M47812 Spondylosis without myelopathy or radiculopathy, cervical region: Secondary | ICD-10-CM | POA: Diagnosis not present

## 2020-01-15 DIAGNOSIS — K219 Gastro-esophageal reflux disease without esophagitis: Secondary | ICD-10-CM | POA: Diagnosis not present

## 2020-01-15 DIAGNOSIS — Z96642 Presence of left artificial hip joint: Secondary | ICD-10-CM | POA: Diagnosis not present

## 2020-01-15 DIAGNOSIS — M1611 Unilateral primary osteoarthritis, right hip: Secondary | ICD-10-CM | POA: Diagnosis not present

## 2020-01-15 DIAGNOSIS — M48061 Spinal stenosis, lumbar region without neurogenic claudication: Secondary | ICD-10-CM | POA: Diagnosis not present

## 2020-01-15 DIAGNOSIS — I10 Essential (primary) hypertension: Secondary | ICD-10-CM | POA: Diagnosis not present

## 2020-01-18 DIAGNOSIS — K219 Gastro-esophageal reflux disease without esophagitis: Secondary | ICD-10-CM | POA: Diagnosis not present

## 2020-01-18 DIAGNOSIS — E1169 Type 2 diabetes mellitus with other specified complication: Secondary | ICD-10-CM | POA: Diagnosis not present

## 2020-01-18 DIAGNOSIS — Z471 Aftercare following joint replacement surgery: Secondary | ICD-10-CM | POA: Diagnosis not present

## 2020-01-18 DIAGNOSIS — Z96642 Presence of left artificial hip joint: Secondary | ICD-10-CM | POA: Diagnosis not present

## 2020-01-18 DIAGNOSIS — M1611 Unilateral primary osteoarthritis, right hip: Secondary | ICD-10-CM | POA: Diagnosis not present

## 2020-01-18 DIAGNOSIS — I10 Essential (primary) hypertension: Secondary | ICD-10-CM | POA: Diagnosis not present

## 2020-01-18 DIAGNOSIS — E785 Hyperlipidemia, unspecified: Secondary | ICD-10-CM | POA: Diagnosis not present

## 2020-01-18 DIAGNOSIS — M47812 Spondylosis without myelopathy or radiculopathy, cervical region: Secondary | ICD-10-CM | POA: Diagnosis not present

## 2020-01-18 DIAGNOSIS — M48061 Spinal stenosis, lumbar region without neurogenic claudication: Secondary | ICD-10-CM | POA: Diagnosis not present

## 2020-01-20 DIAGNOSIS — K219 Gastro-esophageal reflux disease without esophagitis: Secondary | ICD-10-CM | POA: Diagnosis not present

## 2020-01-20 DIAGNOSIS — E785 Hyperlipidemia, unspecified: Secondary | ICD-10-CM | POA: Diagnosis not present

## 2020-01-20 DIAGNOSIS — E1169 Type 2 diabetes mellitus with other specified complication: Secondary | ICD-10-CM | POA: Diagnosis not present

## 2020-01-20 DIAGNOSIS — I10 Essential (primary) hypertension: Secondary | ICD-10-CM | POA: Diagnosis not present

## 2020-01-20 DIAGNOSIS — Z471 Aftercare following joint replacement surgery: Secondary | ICD-10-CM | POA: Diagnosis not present

## 2020-01-20 DIAGNOSIS — M47812 Spondylosis without myelopathy or radiculopathy, cervical region: Secondary | ICD-10-CM | POA: Diagnosis not present

## 2020-01-20 DIAGNOSIS — Z96642 Presence of left artificial hip joint: Secondary | ICD-10-CM | POA: Diagnosis not present

## 2020-01-20 DIAGNOSIS — M1611 Unilateral primary osteoarthritis, right hip: Secondary | ICD-10-CM | POA: Diagnosis not present

## 2020-01-20 DIAGNOSIS — M48061 Spinal stenosis, lumbar region without neurogenic claudication: Secondary | ICD-10-CM | POA: Diagnosis not present

## 2020-01-21 ENCOUNTER — Ambulatory Visit (INDEPENDENT_AMBULATORY_CARE_PROVIDER_SITE_OTHER): Payer: Medicare Other | Admitting: Orthopaedic Surgery

## 2020-01-21 ENCOUNTER — Encounter: Payer: Self-pay | Admitting: Orthopaedic Surgery

## 2020-01-21 ENCOUNTER — Other Ambulatory Visit: Payer: Self-pay

## 2020-01-21 DIAGNOSIS — Z96642 Presence of left artificial hip joint: Secondary | ICD-10-CM

## 2020-01-21 NOTE — Progress Notes (Signed)
The patient is 2 weeks tomorrow status post a left total hip arthroplasty.  We replaced her right hip back in December.  She is doing well overall.  She has been taking her aspirin twice a day.  She is ambulating with just a cane.  She reports good motion and strength.  On examination of her right hip incision her incision looks good.  I remove the staples in place Steri-Strips.  Her leg lengths appear equal.  This point she will continue to increase her activities as comfort allows.  She will go back to just a daily aspirin.  All questions and concerns were answered and addressed.  I would like to see her back in 4 weeks to see how she is doing from a mobility standpoint but no x-rays are needed.

## 2020-02-18 ENCOUNTER — Other Ambulatory Visit: Payer: Self-pay

## 2020-02-18 ENCOUNTER — Encounter: Payer: Self-pay | Admitting: Orthopaedic Surgery

## 2020-02-18 ENCOUNTER — Ambulatory Visit (INDEPENDENT_AMBULATORY_CARE_PROVIDER_SITE_OTHER): Payer: Medicare Other | Admitting: Orthopaedic Surgery

## 2020-02-18 DIAGNOSIS — Z96642 Presence of left artificial hip joint: Secondary | ICD-10-CM

## 2020-02-18 NOTE — Progress Notes (Signed)
The patient is now 6-week status post a left total hip arthroplasty in 6 months post right total hip arthroplasty.  She says the left hip is doing better in terms of numbness she still has more numbness on the right side of the left side but overall she is very active and has done well.  She is 64 years old.  She is already been mowing the lawn and walking 5 miles at a time.  She is walking without an assistive device and no limp.  On exam both hips move smoothly.  There is subjective numbness on the right side.  From my standpoint she will continue to increase her activities as comfort allows.  Hopefully the numbness will dissipate with time.  From my standpoint, I do not need to see her back for 6 months.  At that visit I like a standing low AP pelvis and lateral of both hips.  All questions and concerns were answered and addressed.

## 2020-03-17 ENCOUNTER — Encounter: Payer: Self-pay | Admitting: Orthopaedic Surgery

## 2020-06-20 ENCOUNTER — Other Ambulatory Visit: Payer: Self-pay

## 2020-06-20 MED ORDER — TELMISARTAN 20 MG PO TABS: 20.0000 mg | ORAL_TABLET | Freq: Every day | ORAL | 1 refills | Status: DC

## 2020-06-23 ENCOUNTER — Other Ambulatory Visit: Payer: Self-pay

## 2020-06-23 ENCOUNTER — Ambulatory Visit (INDEPENDENT_AMBULATORY_CARE_PROVIDER_SITE_OTHER): Payer: Medicare Other | Admitting: Internal Medicine

## 2020-06-23 ENCOUNTER — Encounter: Payer: Self-pay | Admitting: Internal Medicine

## 2020-06-23 VITALS — BP 134/84 | HR 67 | Temp 97.8°F | Resp 15 | Ht 66.0 in | Wt 169.6 lb

## 2020-06-23 DIAGNOSIS — E1169 Type 2 diabetes mellitus with other specified complication: Secondary | ICD-10-CM | POA: Diagnosis not present

## 2020-06-23 DIAGNOSIS — E663 Overweight: Secondary | ICD-10-CM

## 2020-06-23 DIAGNOSIS — Z96641 Presence of right artificial hip joint: Secondary | ICD-10-CM | POA: Diagnosis not present

## 2020-06-23 DIAGNOSIS — E119 Type 2 diabetes mellitus without complications: Secondary | ICD-10-CM | POA: Diagnosis not present

## 2020-06-23 DIAGNOSIS — Z1231 Encounter for screening mammogram for malignant neoplasm of breast: Secondary | ICD-10-CM | POA: Diagnosis not present

## 2020-06-23 DIAGNOSIS — E785 Hyperlipidemia, unspecified: Secondary | ICD-10-CM

## 2020-06-23 LAB — LIPID PANEL
Cholesterol: 227 mg/dL — ABNORMAL HIGH (ref 0–200)
HDL: 94.9 mg/dL (ref >39.00)
LDL Cholesterol: 124 mg/dL — ABNORMAL HIGH (ref 0–99)
NonHDL: 132.18
Total CHOL/HDL Ratio: 2
Triglycerides: 41 mg/dL (ref 0.0–149.0)
VLDL: 8.2 mg/dL (ref 0.0–40.0)

## 2020-06-23 LAB — COMPREHENSIVE METABOLIC PANEL
ALT: 11 U/L (ref 0–35)
AST: 15 U/L (ref 0–37)
Albumin: 4.3 g/dL (ref 3.5–5.2)
Alkaline Phosphatase: 91 U/L (ref 39–117)
BUN: 14 mg/dL (ref 6–23)
CO2: 29 mEq/L (ref 19–32)
Calcium: 9.4 mg/dL (ref 8.4–10.5)
Chloride: 103 mEq/L (ref 96–112)
Creatinine, Ser: 0.82 mg/dL (ref 0.40–1.20)
GFR: 75.03 mL/min (ref >60.00)
Glucose, Bld: 93 mg/dL (ref 70–99)
Potassium: 4.3 mEq/L (ref 3.5–5.1)
Sodium: 138 mEq/L (ref 135–145)
Total Bilirubin: 0.4 mg/dL (ref 0.2–1.2)
Total Protein: 6.4 g/dL (ref 6.0–8.3)

## 2020-06-23 LAB — HEMOGLOBIN A1C: Hgb A1c MFr Bld: 6.6 % — ABNORMAL HIGH (ref 4.6–6.5)

## 2020-06-23 MED ORDER — ZOSTER VAC RECOMB ADJUVANTED 50 MCG/0.5ML IM SUSR: 0.5000 mL | Freq: Once | INTRAMUSCULAR | 1 refills | Status: AC

## 2020-06-23 NOTE — Patient Instructions (Signed)
Your annual mammogram has been ordered.  You are encouraged (required) to call to make your appointment at [facility]  You are at goal with BP management  (130/80 or less is desired)

## 2020-06-23 NOTE — Progress Notes (Signed)
Subjective:  Patient ID: Kathy [name], female    DOB: 03-15-1956  Age: 64 y.o. MRN: [id number]  CC: The primary encounter diagnosis was Hyperlipidemia associated with type 2 diabetes mellitus ([place]). Diagnoses of Diabetes mellitus without complication ([place]), Breast cancer screening by mammogram, S/P hip replacement, right, and Overweight (BMI 25.0-29.9) were also pertinent to this visit.  HPI:  NYJAH SCHWAKE presents for follow up on type 2 DM. Overweight and hyperlipidemia  This visit occurred during the SARS-CoV-2 public health emergency.  Safety protocols were in place, including screening questions prior to the visit, additional usage of staff PPE, and extensive cleaning of exam room while observing appropriate contact time as indicated for disinfecting solutions.   Type 2 DM:  She has been Following a low carb high protein diet with daughter who is attending nutritional counselling and has lost 10 lbs   2.5 inches in waist and upper chest . She is walking regularly for exercise.   checking blood sugars once daily at variable times.  BS have been under 130 fasting and < 150 post prandially.  Denies any recent hypoglyemic events.  Taking his medications as directed. Denies numbness, burning and tingling of extremities. Appetite is good.     S/p right hip replacement,  Now s/p bilateral hip replacements now.  Feels better than ever  [place]   Outpatient Medications Prior to Visit  Medication Sig Dispense Refill  . Apple Cider Vinegar 300 MG TABS Take 300 mg by mouth daily.     . Ascorbic Acid (VITAMIN C) 1000 MG tablet Take 1,000 mg by mouth daily.    Marland Kitchen aspirin 81 MG chewable tablet Chew 1 tablet (81 mg total) by mouth 2 (two) times daily. 30 tablet 0  . Cholecalciferol (VITAMIN D3) 10000 UNITS capsule Take 10,000 Units by mouth daily.     . Collagen Hydrolysate, Bovine, POWD     . Cranberry (SM CRANBERRY) 300 MG tablet Take 300 mg by mouth daily.    .  Cyanocobalamin (VITAMIN B-12) 5000 MCG SUBL Take 5,000 mcg by mouth daily.    Javier Docker Oil 500 MG CAPS Take 500 mg by mouth daily.    Marland Kitchen MAGNESIUM GLUCONATE PO Take 800 mg by mouth daily.     . Multiple Vitamins-Minerals (ADULT ONE DAILY GUMMIES PO) Take 2 tablets by mouth daily. Alive Women 50+    . Red Yeast Rice Extract 600 MG CAPS Take 1 capsule (600 mg total) by mouth 2 (two) times daily at 10 AM and 5 PM. (Patient taking differently: Take 1,200 mg by mouth daily. ) 60 capsule 3  . telmisartan (MICARDIS) 20 MG tablet Take 1 tablet (20 mg total) by mouth at bedtime. 90 tablet 1  . Zinc Chelated 22.5 MG TABS Take 1 tablet by mouth daily.      No facility-administered medications prior to visit.    Review of Systems;  Patient denies headache, fevers, malaise, unintentional weight loss, skin rash, eye pain, sinus congestion and sinus pain, sore throat, dysphagia,  hemoptysis , cough, dyspnea, wheezing, chest pain, palpitations, orthopnea, edema, abdominal pain, nausea, melena, diarrhea, constipation, flank pain, dysuria, hematuria, urinary  Frequency, nocturia, numbness, tingling, seizures,  Focal weakness, Loss of consciousness,  Tremor, insomnia, depression, anxiety, and suicidal ideation.      Objective:  BP 134/84 (BP Location: Left Arm, Patient Position: Sitting, Cuff Size: Normal)   Pulse 67   Temp 97.8 F (36.6 C) (Oral)   Resp  15   Ht 5\' 6"  (1.676 m)   Wt 169 lb 9.6 oz (76.9 kg)   SpO2 99%   BMI 27.37 kg/m   BP Readings from Last 3 Encounters:  06/23/20 134/84  01/10/20 123/62  12/30/19 (!) 159/81    Wt Readings from Last 3 Encounters:  06/23/20 169 lb 9.6 oz (76.9 kg)  01/08/20 179 lb (81.2 kg)  12/30/19 179 lb (81.2 kg)    General appearance: alert, cooperative and appears stated age Ears: normal TM's and external ear canals both ears Throat: lips, mucosa, and tongue normal; teeth and gums normal Neck: no adenopathy, no carotid bruit, supple, symmetrical, trachea  midline and thyroid not enlarged, symmetric, no tenderness/mass/nodules Back: symmetric, no curvature. ROM normal. No CVA tenderness. Lungs: clear to auscultation bilaterally Heart: regular rate and rhythm, S1, S2 normal, no murmur, click, rub or gallop Abdomen: soft, non-tender; bowel sounds normal; no masses,  no organomegaly Pulses: 2+ and symmetric Skin: Skin color, texture, turgor normal. No rashes or lesions Lymph nodes: Cervical, supraclavicular, and axillary nodes normal.  Lab Results  Component Value Date   HGBA1C 6.6 (H) 06/23/2020   HGBA1C 6.5 12/17/2019   HGBA1C 6.3 06/18/2019    Lab Results  Component Value Date   CREATININE 0.82 06/23/2020   CREATININE 0.71 01/09/2020   CREATININE 0.80 12/30/2019    Lab Results  Component Value Date   WBC 7.5 01/10/2020   HGB 9.0 (L) 01/10/2020   HCT 28.3 (L) 01/10/2020   PLT 220 01/10/2020   GLUCOSE 93 06/23/2020   CHOL 227 (H) 06/23/2020   TRIG 41.0 06/23/2020   HDL 94.90 06/23/2020   LDLCALC 124 (H) 06/23/2020   ALT 11 06/23/2020   AST 15 06/23/2020   NA 138 06/23/2020   K 4.3 06/23/2020   CL 103 06/23/2020   CREATININE 0.82 06/23/2020   BUN 14 06/23/2020   CO2 29 06/23/2020   TSH 2.09 12/17/2019   HGBA1C 6.6 (H) 06/23/2020   MICROALBUR 0.9 12/17/2019    No results found.  Assessment & Plan:   Problem List Items Addressed This Visit      Unprioritized   Diabetes mellitus without complication ([place])    Remains  well-controlled on diet alone .  hemoglobin A1c has been consistently at or  less than 7.0 . Patient is up-to-date on eye exams and foot exam is normal today. Patient has no microalbuminuria. Patient has deferred statin therapy for CAD risk reduction and on ACE/ARB for renal protection and hypertension    Lab Results  Component Value Date   HGBA1C 6.6 (H) 06/23/2020   Lab Results  Component Value Date   MICROALBUR 0.9 12/17/2019   MICROALBUR <0.7 11/20/2018            Relevant Orders    Hemoglobin A1c (Completed)   Comprehensive metabolic panel (Completed)   Hyperlipidemia associated with type 2 diabetes mellitus ([place]) - Primary    Managed with krill oil and red yeast rice. Current risk of CAD is 18% using the FRC despite her HDL > 90.  Will advise statin therapy   Lab Results  Component Value Date   CHOL 227 (H) 06/23/2020   HDL 94.90 06/23/2020   LDLCALC 124 (H) 06/23/2020   TRIG 41.0 06/23/2020   CHOLHDL 2 06/23/2020         Relevant Orders   Lipid panel (Completed)   S/P hip replacement, right    Done at [facility] by [facility]   Aug 07 2019.  She notes improved mobility and increased capacity for long walks.       Overweight (BMI 25.0-29.9)    I have congratulated her in reduction of   BMI and encouraged  Continued weight loss with goal of 10% of body weigh over the next 6 months using a low glycemic index diet and regular exercise a minimum of 5 days per week.         Other Visit Diagnoses    Breast cancer screening by mammogram       Relevant Orders   MM 3D SCREEN BREAST BILATERAL      I provided  30 minutes of  face-to-face time during this encounter reviewing patient's current problems and past surgeries, labs and imaging studies, providing counseling on the above mentioned problems , and coordination  of care .  I am having Amado Coe. Olmo start on Zoster Vaccine Adjuvanted. I am also having her maintain her Vitamin D3, Cranberry, vitamin C, MAGNESIUM GLUCONATE PO, Red Yeast Rice Extract, Vitamin B-12, Krill Oil, Multiple Vitamins-Minerals (ADULT ONE DAILY GUMMIES PO), Apple Cider Vinegar, aspirin, telmisartan, Collagen Hydrolysate (Bovine), and Zinc Chelated.  Meds ordered this encounter  Medications  . Zoster Vaccine Adjuvanted [facility]) injection    Sig: Inject 0.5 mLs into the muscle once for 1 dose.    Dispense:  1 each    Refill:  1    There are no discontinued medications.  Follow-up: Return in about 6 months (around  12/22/2020).   Crecencio Mc, MD

## 2020-06-25 DIAGNOSIS — E663 Overweight: Secondary | ICD-10-CM | POA: Insufficient documentation

## 2020-06-25 NOTE — Assessment & Plan Note (Signed)
Remains  well-controlled on diet alone .  hemoglobin A1c has been consistently at or  less than 7.0 . Patient is up-to-date on eye exams and foot exam is normal today. Patient has no microalbuminuria. Patient has deferred statin therapy for CAD risk reduction and on ACE/ARB for renal protection and hypertension    Lab Results  Component Value Date   HGBA1C 6.6 (H) 06/23/2020   Lab Results  Component Value Date   MICROALBUR 0.9 12/17/2019   MICROALBUR <0.7 11/20/2018

## 2020-06-25 NOTE — Assessment & Plan Note (Signed)
I have congratulated her in reduction of   BMI and encouraged  Continued weight loss with goal of 10% of body weigh over the next 6 months using a low glycemic index diet and regular exercise a minimum of 5 days per week.    

## 2020-06-25 NOTE — Assessment & Plan Note (Signed)
Done at [facility] long by [facility]   Aug 07 2019.  She notes improved mobility and increased capacity for long walks.

## 2020-06-25 NOTE — Assessment & Plan Note (Signed)
Managed with krill oil and red yeast rice. Current risk of CAD is 18% using the FRC despite her HDL > 90.  Will advise statin therapy   Lab Results  Component Value Date   CHOL 227 (H) 06/23/2020   HDL 94.90 06/23/2020   LDLCALC 124 (H) 06/23/2020   TRIG 41.0 06/23/2020   CHOLHDL 2 06/23/2020

## 2020-06-26 NOTE — Progress Notes (Signed)
Your diabetes is under excellent control currently and have lowered your cholesterol somewhat with the red yeast rice.  However,  Based on your fasting cholesterol and your concurrent history of diabetes hypertension ,  your 10 year risk of having some type of coronary event (including heart attack) is 18% , meaning that one of  every 5-6 women with the same medical statistics will have a heart attack or stroke  in the next 10 years.     Please consider a trial of statin therapy to lower your risk of heart attack and stroke. Remember that statins have been proved to do more than just lower cholesterol.   They stabilize and prevent placque rupture,  Which is what causes heart attacks and strokes.  Diet alone can't do that.  Let me know if you will consider it at this time   Regards,   Deborra Medina, MD

## 2020-07-18 ENCOUNTER — Other Ambulatory Visit: Payer: Self-pay

## 2020-07-18 ENCOUNTER — Ambulatory Visit
Admission: RE | Admit: 2020-07-18 | Discharge: 2020-07-18 | Disposition: A | Discharge status: Home / Self Care | Payer: Medicare Other | Source: Ambulatory Visit | Attending: Internal Medicine | Admitting: Internal Medicine

## 2020-07-18 DIAGNOSIS — Z1231 Encounter for screening mammogram for malignant neoplasm of breast: Secondary | ICD-10-CM | POA: Diagnosis not present

## 2020-07-19 ENCOUNTER — Other Ambulatory Visit: Payer: Self-pay | Admitting: Internal Medicine

## 2020-07-19 DIAGNOSIS — Z79899 Other long term (current) drug therapy: Secondary | ICD-10-CM

## 2020-07-19 MED ORDER — PRAVASTATIN SODIUM 20 MG PO TABS: 20.0000 mg | ORAL_TABLET | Freq: Every day | ORAL | 3 refills | Status: DC

## 2020-07-29 ENCOUNTER — Telehealth: Payer: Self-pay | Admitting: Internal Medicine

## 2020-08-04 ENCOUNTER — Ambulatory Visit: Payer: Medicare Other | Admitting: Orthopaedic Surgery

## 2020-08-04 ENCOUNTER — Encounter: Payer: Self-pay | Admitting: Orthopaedic Surgery

## 2020-08-04 ENCOUNTER — Ambulatory Visit: Payer: Self-pay

## 2020-08-04 DIAGNOSIS — Z96642 Presence of left artificial hip joint: Secondary | ICD-10-CM | POA: Diagnosis not present

## 2020-08-04 DIAGNOSIS — Z96641 Presence of right artificial hip joint: Secondary | ICD-10-CM

## 2020-08-04 NOTE — Progress Notes (Signed)
Normal swallowing me.  She has a history of bilateral hip replacements.  Her right hip was replaced a year ago in her left hip in May of this year.  She says she is doing great overall.  The right thigh is still a little more sensitive to her but she is incredibly active in a young appearing 64 year old.  She said she is in better shape than she was at a younger age.  She has no significant complaints.  She is walking without assistive device.  Her legs are equal.  She tolerates me easily putting her hips the range of motion on both sides.  She does have some pain over the mid IT band on the right side but no significant issues otherwise.  An AP pelvis lateral both hips shows well-seated implants with no complicating features.  At this point point follow-up for hips can be as needed.  If she has any issues at all she knows to give [place] a call immediately.

## 2020-08-18 ENCOUNTER — Other Ambulatory Visit (INDEPENDENT_AMBULATORY_CARE_PROVIDER_SITE_OTHER): Payer: Medicare Other

## 2020-08-18 ENCOUNTER — Other Ambulatory Visit: Payer: Self-pay

## 2020-08-18 DIAGNOSIS — Z79899 Other long term (current) drug therapy: Secondary | ICD-10-CM

## 2020-08-18 LAB — COMPREHENSIVE METABOLIC PANEL
ALT: 10 U/L (ref 0–35)
AST: 13 U/L (ref 0–37)
Albumin: 4.2 g/dL (ref 3.5–5.2)
Alkaline Phosphatase: 86 U/L (ref 39–117)
BUN: 17 mg/dL (ref 6–23)
CO2: 31 mEq/L (ref 19–32)
Calcium: 9.2 mg/dL (ref 8.4–10.5)
Chloride: 100 mEq/L (ref 96–112)
Creatinine, Ser: 0.82 mg/dL (ref 0.40–1.20)
GFR: 74.95 mL/min (ref >60.00)
Glucose, Bld: 112 mg/dL — ABNORMAL HIGH (ref 70–99)
Potassium: 3.8 mEq/L (ref 3.5–5.1)
Sodium: 137 mEq/L (ref 135–145)
Total Bilirubin: 0.5 mg/dL (ref 0.2–1.2)
Total Protein: 6.8 g/dL (ref 6.0–8.3)

## 2020-08-20 NOTE — Progress Notes (Signed)
Your  Surveillance  liver tests were normal,  So you can continue your  current medications.  .Regards,   Brandt Chaney, MD    

## 2020-10-14 DIAGNOSIS — E119 Type 2 diabetes mellitus without complications: Secondary | ICD-10-CM | POA: Diagnosis not present

## 2020-10-14 LAB — HM DIABETES EYE EXAM

## 2020-10-28 ENCOUNTER — Encounter: Payer: Self-pay | Admitting: Internal Medicine

## 2020-12-08 MED ORDER — TELMISARTAN 20 MG PO TABS: 20.0000 mg | ORAL_TABLET | Freq: Every day | ORAL | 0 refills | Status: DC

## 2020-12-22 ENCOUNTER — Ambulatory Visit (INDEPENDENT_AMBULATORY_CARE_PROVIDER_SITE_OTHER): Payer: Medicare Other | Admitting: Internal Medicine

## 2020-12-22 ENCOUNTER — Telehealth: Payer: Self-pay

## 2020-12-22 ENCOUNTER — Other Ambulatory Visit: Payer: Self-pay

## 2020-12-22 ENCOUNTER — Encounter: Payer: Self-pay | Admitting: Internal Medicine

## 2020-12-22 VITALS — BP 118/70 | HR 45 | Temp 96.1°F | Resp 15 | Ht 66.0 in | Wt 170.8 lb

## 2020-12-22 DIAGNOSIS — Z800 Family history of malignant neoplasm of digestive organs: Secondary | ICD-10-CM | POA: Diagnosis not present

## 2020-12-22 DIAGNOSIS — Z1231 Encounter for screening mammogram for malignant neoplasm of breast: Secondary | ICD-10-CM | POA: Diagnosis not present

## 2020-12-22 DIAGNOSIS — E1169 Type 2 diabetes mellitus with other specified complication: Secondary | ICD-10-CM

## 2020-12-22 DIAGNOSIS — R200 Anesthesia of skin: Secondary | ICD-10-CM | POA: Diagnosis not present

## 2020-12-22 DIAGNOSIS — Z2821 Immunization not carried out because of patient refusal: Secondary | ICD-10-CM | POA: Diagnosis not present

## 2020-12-22 DIAGNOSIS — I700 Atherosclerosis of aorta: Secondary | ICD-10-CM

## 2020-12-22 DIAGNOSIS — Z23 Encounter for immunization: Secondary | ICD-10-CM | POA: Diagnosis not present

## 2020-12-22 DIAGNOSIS — E785 Hyperlipidemia, unspecified: Secondary | ICD-10-CM

## 2020-12-22 DIAGNOSIS — E663 Overweight: Secondary | ICD-10-CM

## 2020-12-22 DIAGNOSIS — I10 Essential (primary) hypertension: Secondary | ICD-10-CM

## 2020-12-22 LAB — COMPREHENSIVE METABOLIC PANEL
ALT: 13 U/L (ref 0–35)
AST: 17 U/L (ref 0–37)
Albumin: 4.4 g/dL (ref 3.5–5.2)
Alkaline Phosphatase: 81 U/L (ref 39–117)
BUN: 16 mg/dL (ref 6–23)
CO2: 29 mEq/L (ref 19–32)
Calcium: 9.6 mg/dL (ref 8.4–10.5)
Chloride: 101 mEq/L (ref 96–112)
Creatinine, Ser: 0.83 mg/dL (ref 0.40–1.20)
GFR: 73.68 mL/min (ref >60.00)
Glucose, Bld: 97 mg/dL (ref 70–99)
Potassium: 4.6 mEq/L (ref 3.5–5.1)
Sodium: 137 mEq/L (ref 135–145)
Total Bilirubin: 0.5 mg/dL (ref 0.2–1.2)
Total Protein: 6.8 g/dL (ref 6.0–8.3)

## 2020-12-22 LAB — LIPID PANEL
Cholesterol: 203 mg/dL — ABNORMAL HIGH (ref 0–200)
HDL: 90.9 mg/dL (ref >39.00)
LDL Cholesterol: 102 mg/dL — ABNORMAL HIGH (ref 0–99)
NonHDL: 111.79
Total CHOL/HDL Ratio: 2
Triglycerides: 47 mg/dL (ref 0.0–149.0)
VLDL: 9.4 mg/dL (ref 0.0–40.0)

## 2020-12-22 LAB — HEMOGLOBIN A1C: Hgb A1c MFr Bld: 6.4 % (ref 4.6–6.5)

## 2020-12-22 NOTE — Patient Instructions (Signed)
Good to see you!     Your referral is in process as discussed to [facility] .    Our referral coordinator will call you when the appointment has been made.  If you do not hear from our office in a week,   Please let me know.     You received the Prevnar 20 vaccine today to protect you against streptococcal pnuemonia    Diabetes Mellitus and Standards of [place] with and managing diabetes (diabetes mellitus) can be complicated. Your diabetes treatment may be managed by a team of health care providers, including:  A physician who specializes in diabetes (endocrinologist). You might also have visits with a nurse practitioner or physician assistant.  Nurses.  A registered dietitian.  A certified diabetes care and education specialist.  An exercise specialist.  A pharmacist.  An eye doctor.  A foot specialist (podiatrist).  A dental care provider.  A primary care provider.  A mental health care provider. How to manage your diabetes You can do many things to successfully manage your diabetes. Your health care providers will follow guidelines to help you get the best quality of care. Here are general guidelines for your diabetes management plan. Your health care providers may give you more specific instructions. Physical exams When you are diagnosed with diabetes, and each year after that, your health care provider will ask about your medical and family history. You will have a physical exam, which may include:  Measuring your height, weight, and body mass index (BMI).  Checking your blood pressure. This will be done at every routine medical visit. Your target blood pressure may vary depending on your medical conditions, your age, and other factors.  A thyroid exam.  A skin exam.  Screening for nerve damage (peripheral neuropathy). This may include checking the pulse in your legs and feet and the level of sensation in your hands and feet.  A foot exam to inspect  the structure and skin of your feet, including checking for cuts, bruises, redness, blisters, sores, or other problems.  Screening for blood vessel (vascular) problems. This may include checking the pulse in your legs and feet and checking your temperature. Blood tests Depending on your treatment plan and your personal needs, you may have the following tests:  Hemoglobin A1C (HbA1C). This test provides information about blood sugar (glucose) control over the previous 2-3 months. It is used to adjust your treatment plan, if needed. This test will be done: ? At least 2 times a year, if you are meeting your treatment goals. ? 4 times a year, if you are not meeting your treatment goals or if your goals have changed.  Lipid testing, including total cholesterol, LDL and HDL cholesterol, and triglyceride levels. ? The goal for LDL is less than 100 mg/dL (5.5 mmol/L). If you are at high risk for complications, the goal is less than 70 mg/dL (3.9 mmol/L). ? The goal for HDL is 40 mg/dL (2.2 mmol/L) or higher for men, and 50 mg/dL (2.8 mmol/L) or higher for women. An HDL cholesterol of 60 mg/dL (3.3 mmol/L) or higher gives some protection against heart disease. ? The goal for triglycerides is less than 150 mg/dL (8.3 mmol/L).  Liver function tests.  Kidney function tests.  Thyroid function tests.   Dental and eye exams  Visit your dentist two times a year.  If you have type 1 diabetes, your health care provider may recommend an eye exam within 5 years after you are  diagnosed, and then once a year after your first exam. ? For children with type 1 diabetes, the health care provider may recommend an eye exam when your child is age 77 or older and has had diabetes for 3-5 years. After the first exam, your child should get an eye exam once a year.  If you have type 2 diabetes, your health care provider may recommend an eye exam as soon as you are diagnosed, and then every 1-2 years after your first exam.    Immunizations  A yearly flu (influenza) vaccine is recommended annually for everyone 6 months or older. This is especially important if you have diabetes.  The pneumonia (pneumococcal) vaccine is recommended for everyone 2 years or older who has diabetes. If you are age 27 or older, you may get the pneumonia vaccine as a series of two separate shots.  The hepatitis B vaccine is recommended for adults shortly after being diagnosed with diabetes. Adults and children with diabetes should receive all other vaccines according to age-specific recommendations from the Centers for Disease Control and Prevention (CDC). Mental and emotional health Screening for symptoms of eating disorders, anxiety, and depression is recommended at the time of diagnosis and after as needed. If your screening shows that you have symptoms, you may need more evaluation. You may work with a mental health care provider. Follow these instructions at home: Treatment plan You will monitor your blood glucose levels and may give yourself insulin. Your treatment plan will be reviewed at every medical visit. You and your health care provider will discuss:  How you are taking your medicines, including insulin.  Any side effects you have.  Your blood glucose level target goals.  How often you monitor your blood glucose level.  Lifestyle habits, such as activity level and tobacco, alcohol, and substance use. Education Your health care provider will assess how well you are monitoring your blood glucose levels and whether you are taking your insulin and medicines correctly. He or she may refer you to:  A certified diabetes care and education specialist to manage your diabetes throughout your life, starting at diagnosis.  A registered dietitian who can create and review your personal nutrition plan.  An exercise specialist who can discuss your activity level and exercise plan. General instructions  Take over-the-counter and  prescription medicines only as told by your health care provider.  Keep all follow-up visits. This is important. Where to find support There are many diabetes support networks, including:  American Diabetes Association (ADA): diabetes.org  Defeat Diabetes Foundation: defeatdiabetes.org Where to find more information  American Diabetes Association (ADA): www.diabetes.org  Association of Diabetes Care & Education Specialists (ADCES): diabeteseducator.org  International Diabetes Federation (IDF): https://www.munoz-bell.org/ Summary  Managing diabetes (diabetes mellitus) can be complicated. Your diabetes treatment may be managed by a team of health care providers.  Your health care providers follow guidelines to help you get the best quality care.  You should have physical exams, blood tests, blood pressure monitoring, immunizations, and screening tests regularly. Stay updated on how to manage your diabetes.  Your health care providers may also give you more specific instructions based on your individual health. This information is not intended to replace advice given to you by your health care provider. Make sure you discuss any questions you have with your health care provider. Document Revised: 02/18/2020 Document Reviewed: 02/18/2020 Elsevier Patient Education  [place].

## 2020-12-22 NOTE — Progress Notes (Signed)
Subjective:  Patient ID: Kathy [name], female    DOB: 04-13-1956  Age: 66 y.o. MRN: [id number]  CC: The primary encounter diagnosis was COVID-19 vaccine dose declined. Diagnoses of Hyperlipidemia associated with type 2 diabetes mellitus ([place]), Family history of colon cancer requiring screening colonoscopy, Need for pneumococcal vaccination, Numbness of left hand, Primary hypertension, Encounter for screening mammogram for malignant neoplasm of breast, Overweight (BMI 25.0-29.9), and Abdominal aortic atherosclerosis ([place]) were also pertinent to this visit.  HPI Kathy [name] presents forfollow up on type 2 DM with  hypertension, hyperlipidemia and  Overweight  This visit occurred during the SARS-CoV-2 public health emergency.  Safety protocols were in place, including screening questions prior to the visit, additional usage of staff PPE, and extensive cleaning of exam room while observing appropriate contact time as indicated for disinfecting solutions.    Type 2 DM:  She  feels generally well, is exercising several times per week and checking blood sugars once daily at variable times.  BS have been under 130 fasting and < 150 post prandially.  Denies any recent hypoglyemic events.  Taking her medications as directed. Following a  High protein,  Low carbohydrate  diet 7 days per week. Denies numbness, burning and tingling of extremities. Appetite is good.  Tolerating telmisartan 20 mg and pravastatin 20 mg  For primary prevention .    HTN:  Patient is taking  telmisartan once daily  as prescribed and notes no adverse effects.  Home BP readings have been done about once per week and are  generally < 130/80 .  She is avoiding added salt in her diet and exercising about 5 times per week     Aortic arch atherosclerosis:  Reviewed findings of prior MRI from 2020.  Patient is taking pravastatin .  Discussed the role of higher potency statin therapy in stablizing placque and preventing events.      Outpatient Medications Prior to Visit  Medication Sig Dispense Refill  . Apple Cider Vinegar 300 MG TABS Take 300 mg by mouth daily.     . Ascorbic Acid (VITAMIN C) 1000 MG tablet Take 1,000 mg by mouth daily.    Marland Kitchen aspirin 81 MG chewable tablet Chew 1 tablet (81 mg total) by mouth 2 (two) times daily. 30 tablet 0  . Cholecalciferol (VITAMIN D3) 10000 UNITS capsule Take 10,000 Units by mouth daily.     . Collagen Hydrolysate, Bovine, POWD     . Cranberry 300 MG tablet Take 300 mg by mouth daily.    . Cyanocobalamin (VITAMIN B-12) 5000 MCG SUBL Take 5,000 mcg by mouth daily.    Javier Docker Oil 500 MG CAPS Take 500 mg by mouth daily.    Marland Kitchen MAGNESIUM GLUCONATE PO Take 800 mg by mouth daily.     . Multiple Vitamins-Minerals (ADULT ONE DAILY GUMMIES PO) Take 2 tablets by mouth daily. Alive Women 50+    . pravastatin (PRAVACHOL) 20 MG tablet Take 1 tablet (20 mg total) by mouth daily. 90 tablet 3  . Saccharomyces boulardii (PROBIOTIC) 250 MG CAPS     . telmisartan (MICARDIS) 20 MG tablet Take 1 tablet (20 mg total) by mouth at bedtime. 90 tablet 0  . Zinc Chelated 22.5 MG TABS Take 1 tablet by mouth daily.      No facility-administered medications prior to visit.    Review of Systems;  Patient denies headache, fevers, malaise, unintentional weight loss, skin rash, eye pain, sinus congestion and sinus pain, sore throat,  dysphagia,  hemoptysis , cough, dyspnea, wheezing, chest pain, palpitations, orthopnea, edema, abdominal pain, nausea, melena, diarrhea, constipation, flank pain, dysuria, hematuria, urinary  Frequency, nocturia, numbness, tingling, seizures,  Focal weakness, Loss of consciousness,  Tremor, insomnia, depression, anxiety, and suicidal ideation.      Objective:  BP 118/70 (BP Location: Left Arm, Patient Position: Sitting, Cuff Size: Normal)   Pulse (!) 45   Temp (!) 96.1 F (35.6 C) (Oral)   Resp 15   Ht 5\' 6"  (1.676 m)   Wt 170 lb 12.8 oz (77.5 kg)   SpO2 99%   BMI 27.57  kg/m   BP Readings from Last 3 Encounters:  12/22/20 118/70  06/23/20 134/84  01/10/20 123/62    Wt Readings from Last 3 Encounters:  12/22/20 170 lb 12.8 oz (77.5 kg)  06/23/20 169 lb 9.6 oz (76.9 kg)  01/08/20 179 lb (81.2 kg)    General appearance: alert, cooperative and appears stated age Ears: normal TM's and external ear canals both ears Throat: lips, mucosa, and tongue normal; teeth and gums normal Neck: no adenopathy, no carotid bruit, supple, symmetrical, trachea midline and thyroid not enlarged, symmetric, no tenderness/mass/nodules Back: symmetric, no curvature. ROM normal. No CVA tenderness. Lungs: clear to auscultation bilaterally Heart: regular rate and rhythm, S1, S2 normal, no murmur, click, rub or gallop Abdomen: soft, non-tender; bowel sounds normal; no masses,  no organomegaly Pulses: 2+ and symmetric Skin: Skin color, texture, turgor normal. No rashes or lesions Lymph nodes: Cervical, supraclavicular, and axillary nodes normal.  Lab Results  Component Value Date   HGBA1C 6.4 12/22/2020   HGBA1C 6.6 (H) 06/23/2020   HGBA1C 6.5 12/17/2019    Lab Results  Component Value Date   CREATININE 0.83 12/22/2020   CREATININE 0.82 08/18/2020   CREATININE 0.82 06/23/2020    Lab Results  Component Value Date   WBC 7.5 01/10/2020   HGB 9.0 (L) 01/10/2020   HCT 28.3 (L) 01/10/2020   PLT 220 01/10/2020   GLUCOSE 97 12/22/2020   CHOL 203 (H) 12/22/2020   TRIG 47.0 12/22/2020   HDL 90.90 12/22/2020   LDLCALC 102 (H) 12/22/2020   ALT 13 12/22/2020   AST 17 12/22/2020   NA 137 12/22/2020   K 4.6 12/22/2020   CL 101 12/22/2020   CREATININE 0.83 12/22/2020   BUN 16 12/22/2020   CO2 29 12/22/2020   TSH 2.09 12/17/2019   HGBA1C 6.4 12/22/2020   MICROALBUR 0.9 12/17/2019    MM 3D SCREEN BREAST BILATERAL  Result Date: 07/20/2020 CLINICAL DATA:  Screening. EXAM: DIGITAL SCREENING BILATERAL MAMMOGRAM WITH TOMO AND CAD COMPARISON:  Previous exam(s). ACR  Breast Density Category b: There are scattered areas of fibroglandular density. FINDINGS: There are no findings suspicious for malignancy. Images were processed with CAD. IMPRESSION: No mammographic evidence of malignancy. A result letter of this screening mammogram will be mailed directly to the patient. RECOMMENDATION: Screening mammogram in one year. (Code:SM-B-01Y) BI-RADS CATEGORY  1: Negative. Electronically Signed   By: Valentino Saxon MD   On: 07/20/2020 15:36    Assessment & Plan:   Problem List Items Addressed This Visit      Unprioritized   Screening for breast cancer    Normal mammogram Nov 2021       Overweight (BMI 25.0-29.9)    I have congratulated her in reduction of   BMI and encouraged  Continued weight loss with goal of 10% of body weigh over the next 6 months using a low glycemic  index diet and regular exercise a minimum of 5 days per week.        Numbness of left hand    Her numbness has not progressed and is attributed to cervical disk disease but not proven as she has deferred workup at this time.       Hypertension    Well controlled on current regimen of telmisartan 20 mg daily . Renal function stable, no changes today.  Lab Results  Component Value Date   CREATININE 0.83 12/22/2020   Lab Results  Component Value Date   NA 137 12/22/2020   K 4.6 12/22/2020   CL 101 12/22/2020   CO2 29 12/22/2020         Hyperlipidemia associated with type 2 diabetes mellitus ([place])    Currently well-controlled diabetes on diet alone. . Patient is reminded to schedule an annual eye exam and foot exam is normal today. Patient has no microalbuminuria. Patient is tolerating pravastatin  therapy for CAD risk reduction but will be encouragfed to consdier change to high potency statin and continue telmisartan for renal protection and hypertension   Lab Results  Component Value Date   HGBA1C 6.4 12/22/2020   Lab Results  Component Value Date   CHOL 203 (H) 12/22/2020    HDL 90.90 12/22/2020   LDLCALC 102 (H) 12/22/2020   TRIG 47.0 12/22/2020   CHOLHDL 2 12/22/2020   Lab Results  Component Value Date   MICROALBUR 0.9 12/17/2019   MICROALBUR <0.7 11/20/2018           Relevant Orders   Hemoglobin A1c (Completed)   Lipid panel (Completed)   Comprehensive metabolic panel (Completed)   Family history of colon cancer requiring screening colonoscopy    5 yr follow up colonoscopy is due; referral made       Relevant Orders   Ambulatory referral to Gastroenterology   Abdominal aortic atherosclerosis ([place])    Noted on 2020 MRI abdomen.  Recommend changing pravastatin to higher potency statin .        Other Visit Diagnoses    COVID-19 vaccine dose declined    -  Primary   Relevant Orders   SARS-CoV-2 Semi-Quantitative Total Antibody, Spike   Need for pneumococcal vaccination       Relevant Orders   Pneumococcal conjugate vaccine 20-valent (Prevnar 20) (Completed)     I spent 30 mintutes dedicated to the care of this patient on the date of this encounter to include pre-visit review of his medical history,  Face-to-face time with the patient reviewed her diabetes , hypertension  Control ,  and post visit ordering of testing and therapeutics.  I am having Amado Coe. Brees maintain her Vitamin D3, Cranberry, vitamin C, MAGNESIUM GLUCONATE PO, Vitamin B-12, Krill Oil, Multiple Vitamins-Minerals (ADULT ONE DAILY GUMMIES PO), Apple Cider Vinegar, aspirin, Collagen Hydrolysate (Bovine), Zinc Chelated, pravastatin, telmisartan, and Probiotic.  No orders of the defined types were placed in this encounter.   There are no discontinued medications.  Follow-up: Return in about 4 weeks (around 01/19/2021).   Crecencio Mc, MD

## 2020-12-22 NOTE — Telephone Encounter (Signed)
No and pt was just seen this morning

## 2020-12-22 NOTE — Telephone Encounter (Addendum)
In order to catch the Welcome to Medicare patient would have to be scheduled before 12/25/20 within the next 3 days. Would there be ay possible opening for Monday?

## 2020-12-22 NOTE — Telephone Encounter (Signed)
Pt will need to be scheduled for a welcome to medicare visit sometime this year. Looks like last physical was 06/23/2020.

## 2020-12-24 DIAGNOSIS — I700 Atherosclerosis of aorta: Secondary | ICD-10-CM | POA: Insufficient documentation

## 2020-12-24 NOTE — Assessment & Plan Note (Signed)
Normal mammogram Nov 2021

## 2020-12-24 NOTE — Assessment & Plan Note (Signed)
Her numbness has not progressed and is attributed to cervical disk disease but not proven as she has deferred workup at this time.

## 2020-12-24 NOTE — Assessment & Plan Note (Signed)
Noted on 2020 MRI abdomen.  Recommend changing pravastatin to higher potency statin .

## 2020-12-24 NOTE — Assessment & Plan Note (Signed)
I have congratulated her in reduction of   BMI and encouraged  Continued weight loss with goal of 10% of body weigh over the next 6 months using a low glycemic index diet and regular exercise a minimum of 5 days per week.    

## 2020-12-24 NOTE — Assessment & Plan Note (Signed)
5 yr follow up colonoscopy is due; referral made

## 2020-12-24 NOTE — Assessment & Plan Note (Addendum)
Currently well-controlled diabetes on diet alone. . Patient is reminded to schedule an annual eye exam and foot exam is normal today. Patient has no microalbuminuria. Patient is tolerating pravastatin  therapy for CAD risk reduction but will be encouragfed to consdier change to high potency statin and continue telmisartan for renal protection and hypertension   Lab Results  Component Value Date   HGBA1C 6.4 12/22/2020   Lab Results  Component Value Date   CHOL 203 (H) 12/22/2020   HDL 90.90 12/22/2020   LDLCALC 102 (H) 12/22/2020   TRIG 47.0 12/22/2020   CHOLHDL 2 12/22/2020   Lab Results  Component Value Date   MICROALBUR 0.9 12/17/2019   MICROALBUR <0.7 11/20/2018

## 2020-12-24 NOTE — Assessment & Plan Note (Signed)
Well controlled on current regimen of telmisartan 20 mg daily . Renal function stable, no changes today.  Lab Results  Component Value Date   CREATININE 0.83 12/22/2020   Lab Results  Component Value Date   NA 137 12/22/2020   K 4.6 12/22/2020   CL 101 12/22/2020   CO2 29 12/22/2020

## 2020-12-26 LAB — SARS-COV-2 SEMI-QUANTITATIVE TOTAL ANTIBODY, SPIKE: SARS COV2 AB, Total Spike Semi QN: 3.9 U/mL — ABNORMAL HIGH (ref <0.8)

## 2021-01-03 ENCOUNTER — Telehealth: Payer: Self-pay | Admitting: Internal Medicine

## 2021-01-03 NOTE — Telephone Encounter (Signed)
Left message for patient to call back and schedule Medicare Annual Wellness Visit (AWV) in office.   If not able to come in office, please offer to do virtually or by telephone.   DUE FOR AWVI  Please schedule at anytime with Nurse Health Advisor.   

## 2021-01-03 NOTE — Telephone Encounter (Signed)
Patient returned office phone call. She stated she has her Medicare AWV through her insurance and wished not to schedule AWV at office.

## 2021-01-11 ENCOUNTER — Other Ambulatory Visit: Payer: Self-pay

## 2021-01-15 ENCOUNTER — Other Ambulatory Visit: Payer: Self-pay | Admitting: Internal Medicine

## 2021-01-15 DIAGNOSIS — Z79899 Other long term (current) drug therapy: Secondary | ICD-10-CM

## 2021-01-15 MED ORDER — ROSUVASTATIN CALCIUM 20 MG PO TABS: 20.0000 mg | ORAL_TABLET | Freq: Every day | ORAL | 1 refills | Status: DC

## 2021-02-03 ENCOUNTER — Other Ambulatory Visit: Payer: Self-pay

## 2021-02-03 ENCOUNTER — Ambulatory Visit (INDEPENDENT_AMBULATORY_CARE_PROVIDER_SITE_OTHER): Payer: Medicare Other

## 2021-02-03 VITALS — BP 135/84 | HR 73 | Temp 98.2°F | Resp 14 | Ht 66.0 in | Wt 173.6 lb

## 2021-02-03 DIAGNOSIS — Z0000 Encounter for general adult medical examination without abnormal findings: Secondary | ICD-10-CM | POA: Diagnosis not present

## 2021-02-03 NOTE — Progress Notes (Signed)
Subjective:   Kathy [name] is a 65 y.o. female who presents for an Initial Medicare Annual Wellness Visit.  Review of Systems    No ROS.  Medicare Wellness Cardiac Risk Factors include: advanced age (>15men, >32 women)     Objective:    Today's Vitals   02/03/21 1031  BP: 135/84  Pulse: 73  Resp: 14  Temp: 98.2 F (36.8 C)  TempSrc: Oral  SpO2: 99%  Weight: 173 lb 9.6 oz (78.7 kg)  Height: 5\' 6"  (1.676 m)   Body mass index is 28.02 kg/m.  Advanced Directives 02/03/2021 01/08/2020 12/30/2019 08/07/2019 08/05/2019 05/21/2019 01/03/2016  Does Patient Have a Medical Advance Directive? Yes Yes Yes No No No No  Type of Paramedic of [place];Living will [place];Living will [place];Living will - - - -  Does patient want to make changes to medical advance directive? No - Patient declined No - Patient declined No - Patient declined - - - -  Copy of [place] in Chart? No - copy requested No - copy requested No - copy requested - - - -  Would patient like information on creating a medical advance directive? - - - Yes (MAU/Ambulatory/Procedural Areas - Information given) Yes (MAU/Ambulatory/Procedural Areas - Information given) No - Patient declined No - patient declined information    Current Medications (verified) Outpatient Encounter Medications as of 02/03/2021  Medication Sig   Apple Cider Vinegar 300 MG TABS Take 300 mg by mouth daily.    Ascorbic Acid (VITAMIN C) 1000 MG tablet Take 1,000 mg by mouth daily.   aspirin 81 MG chewable tablet Chew 1 tablet (81 mg total) by mouth 2 (two) times daily.   Cholecalciferol (VITAMIN D3) 10000 UNITS capsule Take 10,000 Units by mouth daily.    Collagen Hydrolysate, Bovine, POWD    Cranberry 300 MG tablet Take 300 mg by mouth daily.   Cyanocobalamin (VITAMIN B-12) 5000 MCG SUBL Take 5,000 mcg by mouth daily.   Krill Oil 500 MG CAPS Take 500 mg by mouth daily.    MAGNESIUM GLUCONATE PO Take 800 mg by mouth daily.    Multiple Vitamins-Minerals (ADULT ONE DAILY GUMMIES PO) Take 2 tablets by mouth daily. Alive Women 50+   pravastatin (PRAVACHOL) 20 MG tablet Take 1 tablet (20 mg total) by mouth daily.   rosuvastatin (CRESTOR) 20 MG tablet Take 1 tablet (20 mg total) by mouth daily.   Saccharomyces boulardii (PROBIOTIC) 250 MG CAPS    telmisartan (MICARDIS) 20 MG tablet Take 1 tablet (20 mg total) by mouth at bedtime.   Zinc Chelated 22.5 MG TABS Take 1 tablet by mouth daily.    No facility-administered encounter medications on file as of 02/03/2021.    Allergies (verified) Neosporin [neomycin-bacitracin zn-polymyx]   History: Past Medical History:  Diagnosis Date   Arthritis    mild   Diabetes mellitus without complication ([place])    Type II,per PCP Dr. Derrel Nip after visit sumary  diet controlled   GERD (gastroesophageal reflux disease)    occasional   History of hiatal hernia    Hypertension    Wears contact lenses    Past Surgical History:  Procedure Laterality Date   COLONOSCOPY  2006   COLONOSCOPY N/A 01/03/2016   Procedure: COLONOSCOPY;  Surgeon: Hulen Luster, MD;  Location: [facility];  Service: Gastroenterology;  Laterality: N/A;   fatty tissue under bilateral arm     TOTAL HIP ARTHROPLASTY  Right 08/07/2019   Procedure: RIGHT TOTAL HIP ARTHROPLASTY ANTERIOR APPROACH;  Surgeon: Mcarthur Rossetti, MD;  Location: [facility];  Service: Orthopedics;  Laterality: Right;   TOTAL HIP ARTHROPLASTY Left 01/08/2020   Procedure: LEFT TOTAL HIP ARTHROPLASTY ANTERIOR APPROACH;  Surgeon: Mcarthur Rossetti, MD;  Location: [facility];  Service: Orthopedics;  Laterality: Left;   Family History  Problem Relation Age of Onset   Hyperlipidemia Mother    Hypertension Mother    Diabetes Mother    Colon cancer Father    Breast cancer Sister 29   Brain cancer Brother    Social History   Socioeconomic History   Marital status: Widowed     Spouse name: Not on file   Number of children: Not on file   Years of education: Not on file   Highest education level: Not on file  Occupational History   Not on file  Tobacco Use   Smoking status: Never   Smokeless tobacco: Never  Vaping Use   Vaping Use: Never used  Substance and Sexual Activity   Alcohol use: Not Currently    Alcohol/week: 10.0 standard drinks    Types: 10 Cans of beer per week    Comment: occasional   Drug use: No   Sexual activity: Not Currently  Other Topics Concern   Not on file  Social History Narrative   Not on file   Social Determinants of Health   Financial Resource Strain: Low Risk    Difficulty of Paying Living Expenses: Not hard at all  Food Insecurity: No Food Insecurity   Worried About Charity fundraiser in the Last Year: Never true   Ran Out of Food in the Last Year: Never true  Transportation Needs: No Transportation Needs   Lack of Transportation (Medical): No   Lack of Transportation (Non-Medical): No  Physical Activity: Sufficiently Active   Days of Exercise per Week: 5 days   Minutes of Exercise per Session: 30 min  Stress: No Stress Concern Present   Feeling of Stress : Not at all  Social Connections: Not on file    Tobacco Counseling Counseling given: Not Answered   Clinical Intake:  Pre-visit preparation completed: Yes        Diabetes: Yes (Diet controlled. Followed by pcp.)  How often do you need to have someone help you when you read instructions, pamphlets, or other written materials from your doctor or pharmacy?: 1 - Never    Interpreter Needed?: No      Activities of Daily Living In your present state of health, do you have any difficulty performing the following activities: 02/03/2021  Hearing? N  Vision? N  Difficulty concentrating or making decisions? N  Walking or climbing stairs? N  Dressing or bathing? N  Doing errands, shopping? N  Preparing Food and eating ? N  Using the Toilet? N  In the  past six months, have you accidently leaked urine? N  Do you have problems with loss of bowel control? N  Managing your Medications? N  Managing your Finances? N  Housekeeping or managing your Housekeeping? N  Some recent data might be hidden    Patient Care Team: Crecencio Mc, MD as PCP - General (Internal Medicine) Crecencio Mc, MD (Internal Medicine) Bary Castilla Forest Gleason, MD (General Surgery)  Indicate any recent Medical Services you may have received from other than [facility] providers in the past year (date may be approximate).     Assessment:   This  is a routine wellness examination for Palmer.  Hearing/Vision screen Hearing Screening - Comments:: Patient is able to hear conversational tones without difficulty.  No issues reported. Vision Screening - Comments:: Wears corrective lenses.  They have seen their ophthalmologist in the last 12 months.    Dietary issues and exercise activities discussed:   Low carb Good water intake   Goals Addressed               This Visit's Progress     Patient Stated     Maintain Healthy Lifestyle (pt-stated)        Stay active; maintain muscle strength         Depression Screen PHQ 2/9 Scores 02/03/2021 12/22/2020 12/17/2019 12/17/2019 05/14/2018 05/13/2017  PHQ - 2 Score 0 0 0 0 0 0  PHQ- 9 Score 0 0 0 - - 2    Fall Risk Fall Risk  02/03/2021 12/22/2020 06/23/2020 12/17/2019 06/18/2019  Falls in the past year? 0 0 0 0 0  Number falls in past yr: 0 - - - -  Injury with Fall? 0 - - - -  Follow up Falls evaluation completed Falls evaluation completed Falls evaluation completed Falls evaluation completed Falls evaluation completed    [place]: Handrails in use when climbing stairs?Yes Home free of loose throw rugs in walkways, pet beds, electrical cords, etc? Yes  Adequate lighting in your home to reduce risk of falls? Yes   ASSISTIVE DEVICES UTILIZED TO PREVENT FALLS:  Life alert? No  Use  of a cane, walker or w/c? No   TIMED UP AND GO:  Was the test performed? Yes .  Length of time to ambulate 10 feet: 10 sec.   Gait steady and fast without use of assistive device  Cognitive Function:  Patient is alert and oriented x3.  Denies difficulty focusing, making decisions, memory loss. MMSE/6CIT deferred. Normal by direct communication/observation.      Health Maintenance Health Maintenance  Topic Date Due   FOOT EXAM  12/16/2020   COLONOSCOPY (Pts 45-1yrs Insurance coverage will need to be confirmed)  01/02/2021   Zoster Vaccines- Shingrix (2 of 2) 02/02/2021   DEXA SCAN  02/03/2022 (Originally 12/27/2019)   INFLUENZA VACCINE  03/27/2021   HEMOGLOBIN A1C  06/23/2021   MAMMOGRAM  07/18/2021   OPHTHALMOLOGY EXAM  10/14/2021   TETANUS/TDAP  11/17/2023   Hepatitis C Screening  Completed   HPV VACCINES  Aged Out   COVID-19 Vaccine  Discontinued   Colonoscopy- due. Patient notes in the process of scheduling appointment.   Bone density- postponed per patient.   Lung Cancer Screening: (Low Dose CT Chest recommended if Age 58-80 years, 30 pack-year currently smoking OR have quit w/in 15years.) does not qualify.   Vision Screening: Recommended annual ophthalmology exams for early detection of glaucoma and other disorders of the eye. Is the patient up to date with their annual eye exam?  Yes   Dental Screening: Recommended annual dental exams for proper oral hygiene.  Community Resource Referral / Chronic Care Management: CRR required this visit?  No   CCM required this visit?  No      Plan:   Keep all routine maintenance appointments.   I have personally reviewed and noted the following in the patient's chart:   Medical and social history Use of alcohol, tobacco or illicit drugs  Current medications and supplements including opioid prescriptions. Patient is not currently taking opioid prescriptions. Functional ability and status  Nutritional status Physical  activity Advanced directives List of other physicians Hospitalizations, surgeries, and ER visits in previous 12 months Vitals Screenings to include cognitive, depression, and falls Referrals and appointments  In addition, I have reviewed and discussed with patient certain preventive protocols, quality metrics, and best practice recommendations. A written personalized care plan for preventive services as well as general preventive health recommendations were provided to patient via mychart.     Varney Biles, LPN   1/77/1165

## 2021-02-03 NOTE — Patient Instructions (Addendum)
Kathy [name] , Thank you for taking time to come for your Medicare Wellness Visit. I appreciate your ongoing commitment to your health goals. Please review the following plan we discussed and let me know if I can assist you in the future.   These are the goals we discussed:  Goals       Patient Stated     Maintain Healthy Lifestyle (pt-stated)      Stay active; maintain muscle strength          This is a list of the screening recommended for you and due dates:  Health Maintenance  Topic Date Due   Complete foot exam   12/16/2020   Colon Cancer Screening  01/02/2021   Zoster (Shingles) Vaccine (2 of 2) 02/02/2021   DEXA scan (bone density measurement)  02/03/2022*   Flu Shot  03/27/2021   Hemoglobin A1C  06/23/2021   Mammogram  07/18/2021   Eye exam for diabetics  10/14/2021   Tetanus Vaccine  11/17/2023   Hepatitis C Screening: USPSTF Recommendation to screen - Ages 18-79 yo.  Completed   HPV Vaccine  Aged Out   COVID-19 Vaccine  Discontinued  *Topic was postponed. The date shown is not the original due date.     Immunizations Immunization History  Administered Date(s) Administered   PNEUMOCOCCAL CONJUGATE-20 12/22/2020   Tdap 11/16/2013   Zoster Recombinat (Shingrix) 12/08/2020   Advanced directives: End of life planning; Advance aging; Advanced directives discussed.  Copy of current HCPOA/Living Will requested.    Conditions/risks identified: none new   Follow up in one year for your annual wellness visit    Preventive Care 65 Years and Older, Female Preventive care refers to lifestyle choices and visits with your health care provider that can promote health and wellness. What does preventive care include? A yearly physical exam. This is also called an annual well check. Dental exams once or twice a year. Routine eye exams. Ask your health care provider how often you should have your eyes checked. Personal lifestyle choices, including: Daily care of your teeth  and gums. Regular physical activity. Eating a healthy diet. Avoiding tobacco and drug use. Limiting alcohol use. Practicing safe sex. Taking low-dose aspirin every day. Taking vitamin and mineral supplements as recommended by your health care provider. What happens during an annual well check? The services and screenings done by your health care provider during your annual well check will depend on your age, overall health, lifestyle risk factors, and family history of disease. Counseling  Your health care provider may ask you questions about your: Alcohol use. Tobacco use. Drug use. Emotional well-being. Home and relationship well-being. Sexual activity. Eating habits. History of falls. Memory and ability to understand (cognition). Work and work Statistician. Reproductive health. Screening  You may have the following tests or measurements: Height, weight, and BMI. Blood pressure. Lipid and cholesterol levels. These may be checked every 5 years, or more frequently if you are over 61 years old. Skin check. Lung cancer screening. You may have this screening every year starting at age 27 if you have a 30-pack-year history of smoking and currently smoke or have quit within the past 15 years. Fecal occult blood test (FOBT) of the stool. You may have this test every year starting at age 68. Flexible sigmoidoscopy or colonoscopy. You may have a sigmoidoscopy every 5 years or a colonoscopy every 10 years starting at age 68. Hepatitis C blood test. Hepatitis B blood test. Sexually transmitted disease (STD) testing.  Diabetes screening. This is done by checking your blood sugar (glucose) after you have not eaten for a while (fasting). You may have this done every 1-3 years. Bone density scan. This is done to screen for osteoporosis. You may have this done starting at age 16. Mammogram. This may be done every 1-2 years. Talk to your health care provider about how often you should have regular  mammograms. Talk with your health care provider about your test results, treatment options, and if necessary, the need for more tests. Vaccines  Your health care provider may recommend certain vaccines, such as: Influenza vaccine. This is recommended every year. Tetanus, diphtheria, and acellular pertussis (Tdap, Td) vaccine. You may need a Td booster every 10 years. Zoster vaccine. You may need this after age 79. Pneumococcal 13-valent conjugate (PCV13) vaccine. One dose is recommended after age 45. Pneumococcal polysaccharide (PPSV23) vaccine. One dose is recommended after age 55. Talk to your health care provider about which screenings and vaccines you need and how often you need them. This information is not intended to replace advice given to you by your health care provider. Make sure you discuss any questions you have with your health care provider. Document Released: 09/09/2015 Document Revised: 05/02/2016 Document Reviewed: 06/14/2015 Elsevier Interactive Patient Education  2017 [place] Prevention in the Home Falls can cause injuries. They can happen to people of all ages. There are many things you can do to make your home safe and to help prevent falls. What can I do on the outside of my home? Regularly fix the edges of walkways and driveways and fix any cracks. Remove anything that might make you trip as you walk through a door, such as a raised step or threshold. Trim any bushes or trees on the path to your home. Use bright outdoor lighting. Clear any walking paths of anything that might make someone trip, such as rocks or tools. Regularly check to see if handrails are loose or broken. Make sure that both sides of any steps have handrails. Any raised decks and porches should have guardrails on the edges. Have any leaves, snow, or ice cleared regularly. Use sand or salt on walking paths during winter. Clean up any spills in your garage right away. This includes oil  or grease spills. What can I do in the bathroom? Use night lights. Install grab bars by the toilet and in the tub and shower. Do not use towel bars as grab bars. Use non-skid mats or decals in the tub or shower. If you need to sit down in the shower, use a plastic, non-slip stool. Keep the floor dry. Clean up any water that spills on the floor as soon as it happens. Remove soap buildup in the tub or shower regularly. Attach bath mats securely with double-sided non-slip rug tape. Do not have throw rugs and other things on the floor that can make you trip. What can I do in the bedroom? Use night lights. Make sure that you have a light by your bed that is easy to reach. Do not use any sheets or blankets that are too big for your bed. They should not hang down onto the floor. Have a firm chair that has side arms. You can use this for support while you get dressed. Do not have throw rugs and other things on the floor that can make you trip. What can I do in the kitchen? Clean up any spills right away. Avoid walking on wet floors.  Keep items that you use a lot in easy-to-reach places. If you need to reach something above you, use a strong step stool that has a grab bar. Keep electrical cords out of the way. Do not use floor polish or wax that makes floors slippery. If you must use wax, use non-skid floor wax. Do not have throw rugs and other things on the floor that can make you trip. What can I do with my stairs? Do not leave any items on the stairs. Make sure that there are handrails on both sides of the stairs and use them. Fix handrails that are broken or loose. Make sure that handrails are as long as the stairways. Check any carpeting to make sure that it is firmly attached to the stairs. Fix any carpet that is loose or worn. Avoid having throw rugs at the top or bottom of the stairs. If you do have throw rugs, attach them to the floor with carpet tape. Make sure that you have a light  switch at the top of the stairs and the bottom of the stairs. If you do not have them, ask someone to add them for you. What else can I do to help prevent falls? Wear shoes that: Do not have high heels. Have rubber bottoms. Are comfortable and fit you well. Are closed at the toe. Do not wear sandals. If you use a stepladder: Make sure that it is fully opened. Do not climb a closed stepladder. Make sure that both sides of the stepladder are locked into place. Ask someone to hold it for you, if possible. Clearly mark and make sure that you can see: Any grab bars or handrails. First and last steps. Where the edge of each step is. Use tools that help you move around (mobility aids) if they are needed. These include: Canes. Walkers. Scooters. Crutches. Turn on the lights when you go into a dark area. Replace any light bulbs as soon as they burn out. Set up your furniture so you have a clear path. Avoid moving your furniture around. If any of your floors are uneven, fix them. If there are any pets around you, be aware of where they are. Review your medicines with your doctor. Some medicines can make you feel dizzy. This can increase your chance of falling. Ask your doctor what other things that you can do to help prevent falls. This information is not intended to replace advice given to you by your health care provider. Make sure you discuss any questions you have with your health care provider. Document Released: 06/09/2009 Document Revised: 01/19/2016 Document Reviewed: 09/17/2014 Elsevier Interactive Patient Education  2017 [employer].

## 2021-02-06 IMAGING — MR MR ABDOMEN WO/W CM
20 series · 48 of 48 positions shown · IV contrast (gadavist)
Comparison: Abdominal CT 05/15/2019.

CLINICAL DATA: Left renal lesion on the CT.

EXAM:
MRI ABDOMEN WITHOUT AND WITH CONTRAST
TECHNIQUE: Multiplanar multisequence MR imaging of the abdomen was performed
both before and after the administration of intravenous contrast.
CONTRAST:  8mL GADAVIST GADOBUTROL 1 MMOL/ML IV SOLN 8 cc Gadavist

[Series 2: cor haste · coronal · 6.0mm · 1.19mm/px · 2 of 30 slices shown]
[im 1/30]
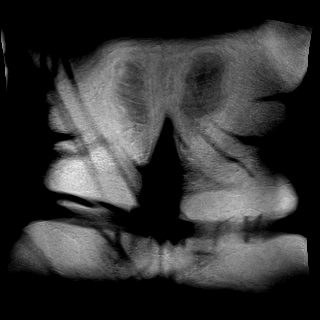
[im 30/30]
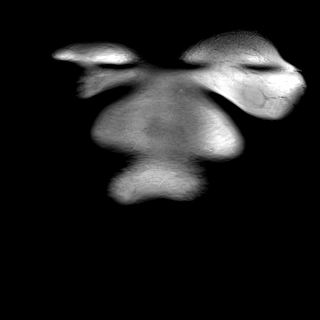

[Series 5: T2 fat-sat · axial · 6.0mm · 1.19mm/px · z∈[-6,+217]mm · 2 of 32 slices shown]
[im 1/32]
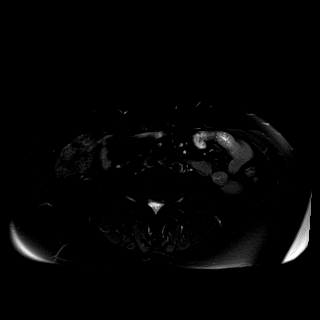
[im 32/32]
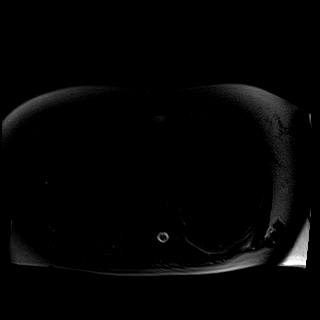

[Series 6: DWI · axial · 6.0mm · 1.42mm/px · z∈[-6,+217]mm · 2 of 32 slices shown (1 of 4)]
[im 1/32]
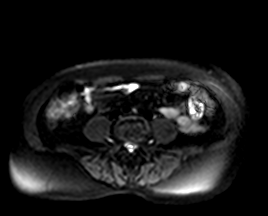
[im 32/32]
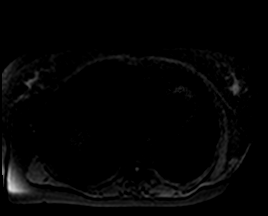

[Series 6: DWI · axial · 6.0mm · 1.42mm/px · z∈[-6,+217]mm · 2 of 32 slices shown (2 of 4)]
[im 1/32]
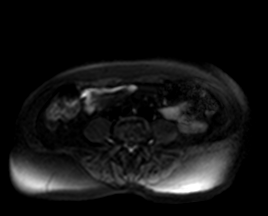
[im 32/32]
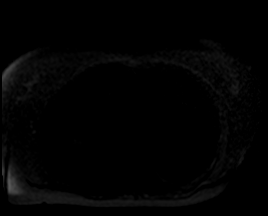

[Series 6: DWI · axial · 6.0mm · 1.42mm/px · 1 of 32 slices shown (3 of 4)]
[im 1/32]
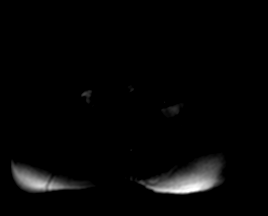

[Series 7: DWI · axial · 6.0mm · 1.42mm/px · 1 of 32 slices shown (4 of 4)]
[im 1/32]
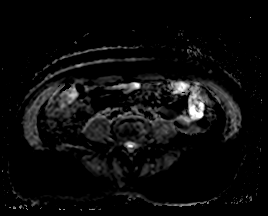

[Series 8: ax in & · axial · 3.0mm · 1.19mm/px · z∈[-1,+212]mm · 3 of 72 slices shown (1 of 2)]
[im 1/72]
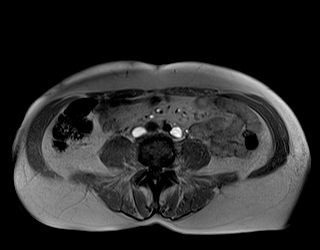
[im 36/72]
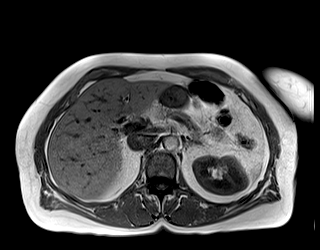
[im 72/72]
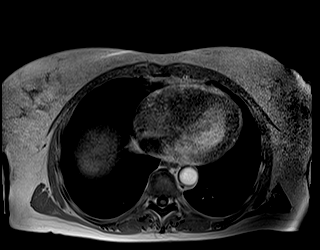

[Series 8: ax in & · axial · 3.0mm · 1.19mm/px · z∈[-1,+212]mm · 3 of 72 slices shown (2 of 2)]
[im 1/72]
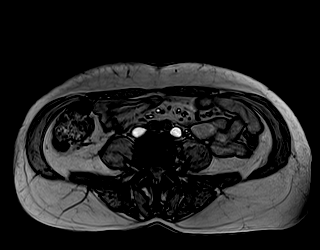
[im 36/72]
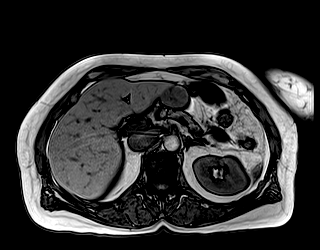
[im 72/72]
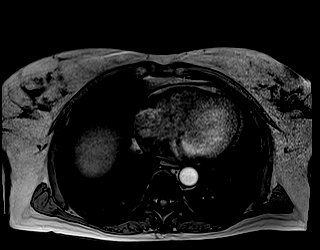

[Series 9: bSSFP · axial · 6.0mm · 0.74mm/px · 1 of 32 slices shown]
[im 1/32]
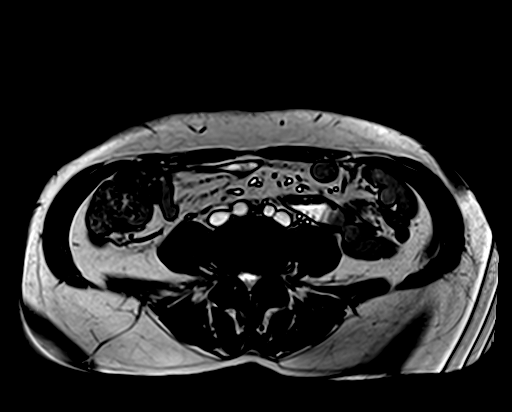

[Series 10: T2 · axial · 6.0mm · 1.19mm/px · 1 of 32 slices shown]
[im 1/32]
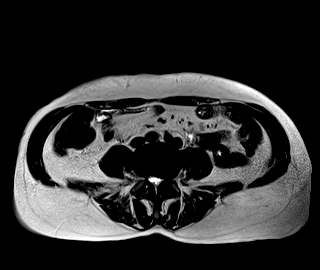

[Series 11: T1 dynamic · axial · non-contrast · 3.0mm · 1.19mm/px · z∈[-7,+230]mm · 3 of 80 slices shown (1 of 9)]
[im 1/80]
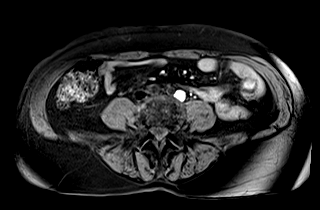
[im 40/80]
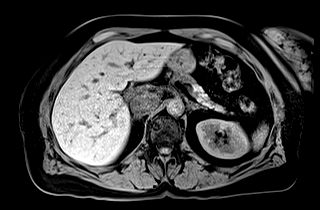
[im 80/80]
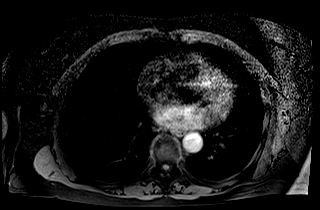

[Series 12: T1 dynamic · axial · 3.0mm · 1.19mm/px · z∈[-7,+230]mm · 3 of 80 slices shown (2 of 9)]
[im 1/80]
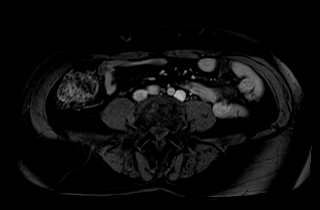
[im 40/80]
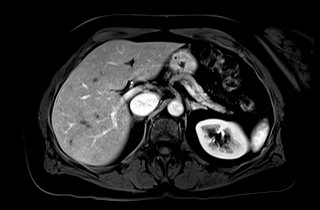
[im 80/80]
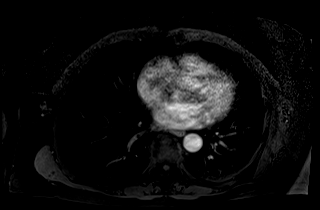

[Series 13: T1 dynamic · axial · 3.0mm · 1.19mm/px · z∈[-7,+230]mm · 3 of 80 slices shown (3 of 9)]
[im 1/80]
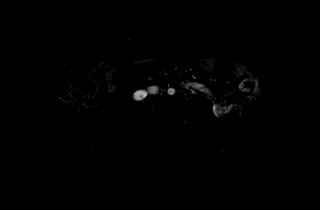
[im 40/80]
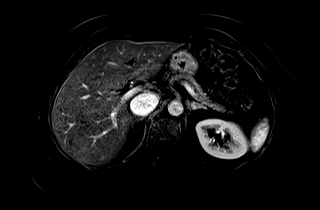
[im 80/80]
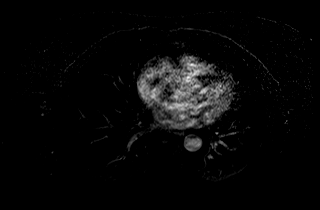

[Series 14: T1 dynamic · axial · 3.0mm · 1.19mm/px · z∈[-7,+230]mm · 3 of 80 slices shown (4 of 9)]
[im 1/80]
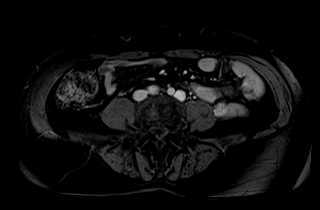
[im 40/80]
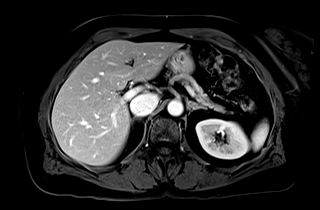
[im 80/80]
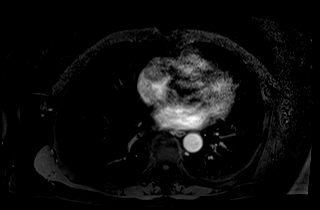

[Series 15: T1 dynamic · axial · 3.0mm · 1.19mm/px · z∈[-7,+230]mm · 3 of 80 slices shown (5 of 9)]
[im 1/80]
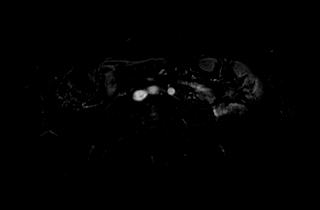
[im 40/80]
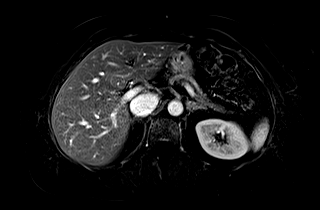
[im 80/80]
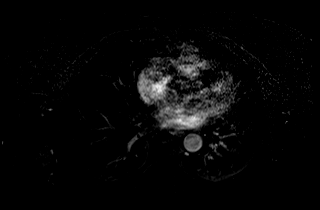

[Series 16: T1 dynamic · axial · 3.0mm · 1.19mm/px · z∈[-7,+230]mm · 3 of 80 slices shown (6 of 9)]
[im 1/80]
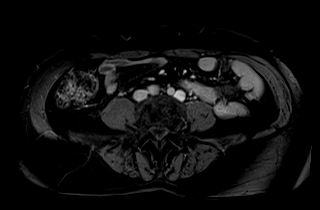
[im 40/80]
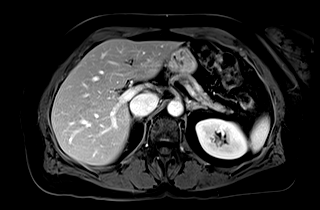
[im 80/80]
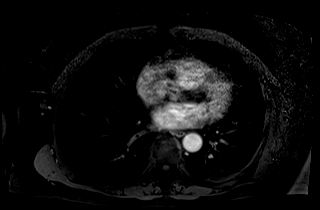

[Series 17: T1 dynamic · axial · 3.0mm · 1.19mm/px · z∈[-7,+230]mm · 3 of 80 slices shown (7 of 9)]
[im 1/80]
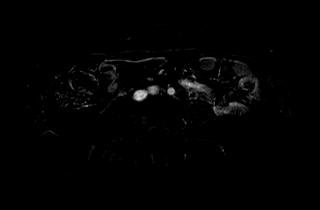
[im 40/80]
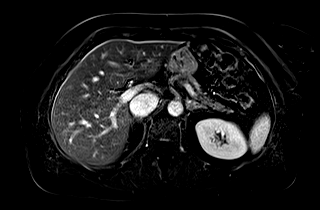
[im 80/80]
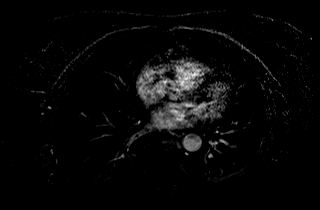

[Series 18: T1 dynamic post-contrast · coronal · 3.0mm · 1.31mm/px · 3 of 80 slices shown]
[im 1/80]
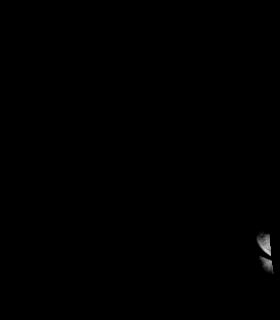
[im 40/80]
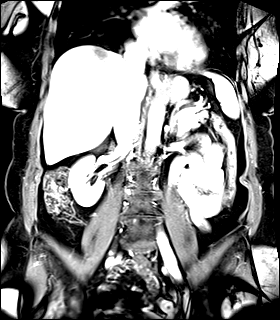
[im 80/80]
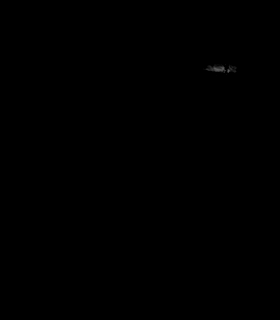

[Series 19: T1 dynamic · axial · 3.0mm · 1.19mm/px · z∈[-7,+230]mm · 3 of 80 slices shown (8 of 9)]
[im 1/80]
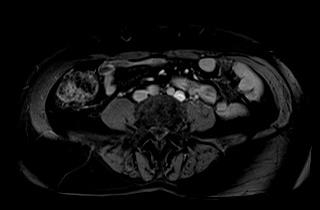
[im 40/80]
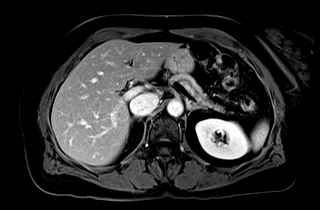
[im 80/80]
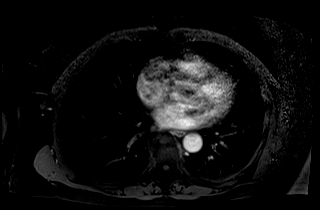

[Series 20: T1 dynamic · axial · 3.0mm · 1.19mm/px · z∈[-7,+230]mm · 3 of 80 slices shown (9 of 9)]
[im 1/80]
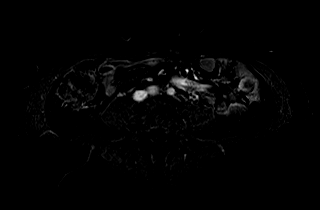
[im 40/80]
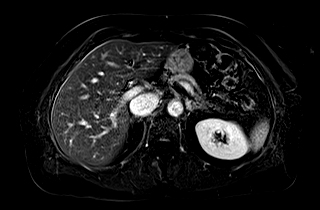
[im 80/80]
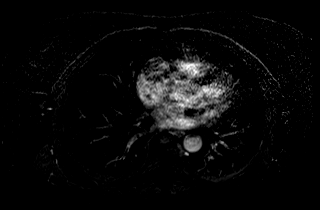

[48 of 48 positions shown; findings below may reference images not displayed]

FINDINGS: Lower chest: Normal heart size without pericardial or pleural
effusion.

Hepatobiliary: Normal liver. Normal gallbladder, without biliary
ductal dilatation.

Pancreas:  Normal, without mass or ductal dilatation.

Spleen: No splenomegaly. Vague subcapsular hypoenhancing 6 mm lesion
on 37/16 is of doubtful clinical significance, likely a
lymphangioma.

Adrenals/Urinary Tract: Normal right adrenal gland. Left adrenal
nodularity, with suggestion of signal dropout on out of phase
imaging, favoring an 8 mm adenoma.

Tiny bilateral renal lesions which are likely cysts.

The CT abnormality corresponds to a 1.9 cm central left renal simple
cyst, likely a sinus cyst. Example [DATE]. No suspicious
characteristics. No hydronephrosis.

Stomach/Bowel: Tiny hiatal hernia. Proximal gastric underdistention.
Normal abdominal bowel loops.

Vascular/Lymphatic: Aortic atherosclerosis. Duplicated IVC. No
abdominal adenopathy.

Other:  No ascites.

Musculoskeletal: No acute osseous abnormality.
IMPRESSION: 1. The CT abnormality represents a simple left renal sinus cyst.
2.  No acute abdominal process.
3.  Tiny hiatal hernia.

## 2021-02-07 ENCOUNTER — Telehealth (INDEPENDENT_AMBULATORY_CARE_PROVIDER_SITE_OTHER): Payer: Self-pay | Admitting: Gastroenterology

## 2021-02-07 DIAGNOSIS — Z1211 Encounter for screening for malignant neoplasm of colon: Secondary | ICD-10-CM

## 2021-02-07 DIAGNOSIS — Z800 Family history of malignant neoplasm of digestive organs: Secondary | ICD-10-CM

## 2021-02-07 MED ORDER — NA SULFATE-K SULFATE-MG SULF 17.5-3.13-1.6 GM/177ML PO SOLN: 1.0000 | Freq: Once | ORAL | 0 refills | Status: AC

## 2021-02-07 NOTE — Progress Notes (Signed)
Gastroenterology Pre-Procedure Review  Request Date: 03/06/21 Requesting Physician: Dr. Marius Ditch  PATIENT REVIEW QUESTIONS: The patient responded to the following health history questions as indicated:    1. Are you having any GI issues? no 2. Do you have a personal history of Polyps? no 3. Do you have a family history of Colon Cancer or Polyps? yes (mother colon polyps) 4. 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 Joint replacements in the past 12 months?no 6. Major health problems in the past 3 months?no 7. Any artificial heart valves, MVP, or defibrillator?no    MEDICATIONS & ALLERGIES:    Patient reports the following regarding taking any anticoagulation/antiplatelet therapy:   Plavix, Coumadin, Eliquis, Xarelto, Lovenox, Pradaxa, Brilinta, or Effient? no Aspirin? yes (aspirin 81 mg)  Patient confirms/reports the following medications:  Current Outpatient Medications  Medication Sig Dispense Refill   Na Sulfate-K Sulfate-Mg Sulf 17.5-3.13-1.6 GM/177ML SOLN Take 1 kit by mouth once for 1 dose. 354 mL 0   Apple Cider Vinegar 300 MG TABS Take 300 mg by mouth daily.      Ascorbic Acid (VITAMIN C) 1000 MG tablet Take 1,000 mg by mouth daily.     aspirin 81 MG chewable tablet Chew 1 tablet (81 mg total) by mouth 2 (two) times daily. 30 tablet 0   Cholecalciferol (VITAMIN D3) 10000 UNITS capsule Take 10,000 Units by mouth daily.      Collagen Hydrolysate, Bovine, POWD      Cranberry 300 MG tablet Take 300 mg by mouth daily.     Cyanocobalamin (VITAMIN B-12) 5000 MCG SUBL Take 5,000 mcg by mouth daily.     Krill Oil 500 MG CAPS Take 500 mg by mouth daily.     MAGNESIUM GLUCONATE PO Take 800 mg by mouth daily.      Multiple Vitamins-Minerals (ADULT ONE DAILY GUMMIES PO) Take 2 tablets by mouth daily. Alive Women 50+     pravastatin (PRAVACHOL) 20 MG tablet Take 1 tablet (20 mg total) by mouth daily. 90 tablet 3   rosuvastatin (CRESTOR) 20 MG tablet Take 1 tablet (20 mg total) by mouth daily. 30  tablet 1   Saccharomyces boulardii (PROBIOTIC) 250 MG CAPS      telmisartan (MICARDIS) 20 MG tablet Take 1 tablet (20 mg total) by mouth at bedtime. 90 tablet 0   Zinc Chelated 22.5 MG TABS Take 1 tablet by mouth daily.      No current facility-administered medications for this visit.    Patient confirms/reports the following allergies:  Allergies  Allergen Reactions   Neosporin [Neomycin-Bacitracin Zn-Polymyx] Rash    No orders of the defined types were placed in this encounter.   AUTHORIZATION INFORMATION Primary Insurance: 1D#: Group #:  Secondary Insurance: 1D#: Group #:  SCHEDULE INFORMATION: Date: 03/06/2021 Time: Location: [facility]

## 2021-02-15 ENCOUNTER — Other Ambulatory Visit: Payer: Self-pay

## 2021-02-15 ENCOUNTER — Other Ambulatory Visit (INDEPENDENT_AMBULATORY_CARE_PROVIDER_SITE_OTHER): Payer: Medicare Other

## 2021-02-15 DIAGNOSIS — Z79899 Other long term (current) drug therapy: Secondary | ICD-10-CM | POA: Diagnosis not present

## 2021-02-15 LAB — COMPREHENSIVE METABOLIC PANEL
ALT: 12 U/L (ref 0–35)
AST: 15 U/L (ref 0–37)
Albumin: 4.1 g/dL (ref 3.5–5.2)
Alkaline Phosphatase: 85 U/L (ref 39–117)
BUN: 17 mg/dL (ref 6–23)
CO2: 28 mEq/L (ref 19–32)
Calcium: 9.1 mg/dL (ref 8.4–10.5)
Chloride: 100 mEq/L (ref 96–112)
Creatinine, Ser: 0.8 mg/dL (ref 0.40–1.20)
GFR: 76.93 mL/min (ref >60.00)
Glucose, Bld: 98 mg/dL (ref 70–99)
Potassium: 4 mEq/L (ref 3.5–5.1)
Sodium: 135 mEq/L (ref 135–145)
Total Bilirubin: 0.4 mg/dL (ref 0.2–1.2)
Total Protein: 6.4 g/dL (ref 6.0–8.3)

## 2021-02-17 NOTE — Telephone Encounter (Signed)
Noted  

## 2021-03-06 ENCOUNTER — Encounter
Admission: RE | Disposition: A | Discharge status: Home / Self Care | Payer: Self-pay | Source: Home / Self Care | Attending: Gastroenterology

## 2021-03-06 ENCOUNTER — Ambulatory Visit: Payer: Medicare Other | Admitting: Anesthesiology

## 2021-03-06 ENCOUNTER — Ambulatory Visit
Admission: RE | Admit: 2021-03-06 | Discharge: 2021-03-06 | Disposition: A | Discharge status: Home / Self Care | Payer: Medicare Other | Attending: Gastroenterology | Admitting: Gastroenterology

## 2021-03-06 DIAGNOSIS — K5730 Diverticulosis of large intestine without perforation or abscess without bleeding: Secondary | ICD-10-CM | POA: Diagnosis not present

## 2021-03-06 DIAGNOSIS — Z800 Family history of malignant neoplasm of digestive organs: Secondary | ICD-10-CM

## 2021-03-06 DIAGNOSIS — D125 Benign neoplasm of sigmoid colon: Secondary | ICD-10-CM | POA: Diagnosis not present

## 2021-03-06 DIAGNOSIS — E119 Type 2 diabetes mellitus without complications: Secondary | ICD-10-CM | POA: Diagnosis not present

## 2021-03-06 DIAGNOSIS — Z1211 Encounter for screening for malignant neoplasm of colon: Secondary | ICD-10-CM | POA: Diagnosis not present

## 2021-03-06 DIAGNOSIS — Z8249 Family history of ischemic heart disease and other diseases of the circulatory system: Secondary | ICD-10-CM | POA: Diagnosis not present

## 2021-03-06 DIAGNOSIS — Z8371 Family history of colonic polyps: Secondary | ICD-10-CM | POA: Diagnosis not present

## 2021-03-06 DIAGNOSIS — Z7982 Long term (current) use of aspirin: Secondary | ICD-10-CM | POA: Insufficient documentation

## 2021-03-06 DIAGNOSIS — Z881 Allergy status to other antibiotic agents status: Secondary | ICD-10-CM | POA: Insufficient documentation

## 2021-03-06 DIAGNOSIS — Z8349 Family history of other endocrine, nutritional and metabolic diseases: Secondary | ICD-10-CM | POA: Insufficient documentation

## 2021-03-06 DIAGNOSIS — Z803 Family history of malignant neoplasm of breast: Secondary | ICD-10-CM | POA: Insufficient documentation

## 2021-03-06 DIAGNOSIS — K635 Polyp of colon: Secondary | ICD-10-CM

## 2021-03-06 DIAGNOSIS — D1809 Hemangioma of other sites: Secondary | ICD-10-CM | POA: Insufficient documentation

## 2021-03-06 DIAGNOSIS — Z833 Family history of diabetes mellitus: Secondary | ICD-10-CM | POA: Diagnosis not present

## 2021-03-06 DIAGNOSIS — Z79899 Other long term (current) drug therapy: Secondary | ICD-10-CM | POA: Insufficient documentation

## 2021-03-06 DIAGNOSIS — D126 Benign neoplasm of colon, unspecified: Secondary | ICD-10-CM | POA: Diagnosis not present

## 2021-03-06 HISTORY — PX: COLONOSCOPY WITH PROPOFOL: SHX5780

## 2021-03-06 LAB — GLUCOSE, CAPILLARY: Glucose-Capillary: 107 mg/dL — ABNORMAL HIGH (ref 70–99)

## 2021-03-06 SURGERY — COLONOSCOPY WITH PROPOFOL
Anesthesia: General

## 2021-03-06 MED ORDER — PROPOFOL 10 MG/ML IV BOLUS
INTRAVENOUS | Status: DC | PRN
  Administered 2021-03-06: 70 mg via INTRAVENOUS

## 2021-03-06 MED ORDER — SODIUM CHLORIDE 0.9 % IV SOLN
INTRAVENOUS | Status: DC
  Administered 2021-03-06: 20 mL/h via INTRAVENOUS

## 2021-03-06 MED ORDER — LIDOCAINE HCL (PF) 2 % IJ SOLN
INTRAMUSCULAR | Status: AC
  Filled 2021-03-06: qty 10

## 2021-03-06 MED ORDER — LIDOCAINE HCL (CARDIAC) PF 100 MG/5ML IV SOSY
PREFILLED_SYRINGE | INTRAVENOUS | Status: DC | PRN
  Administered 2021-03-06: 50 mg via INTRAVENOUS

## 2021-03-06 MED ORDER — PROPOFOL 500 MG/50ML IV EMUL
INTRAVENOUS | Status: DC | PRN
  Administered 2021-03-06: 120 ug/kg/min via INTRAVENOUS

## 2021-03-06 NOTE — Anesthesia Preprocedure Evaluation (Signed)
Anesthesia Evaluation  Patient identified by MRN, date of birth, ID band Patient awake    Reviewed: Allergy & Precautions, NPO status , Patient's Chart, lab work & pertinent test results  History of Anesthesia Complications Negative for: history of anesthetic complications  Airway Mallampati: II  TM Distance: >3 FB Neck ROM: Full    Dental no notable dental hx.    Pulmonary neg pulmonary ROS, neg sleep apnea, neg COPD,    breath sounds clear to auscultation- rhonchi (-) wheezing      Cardiovascular Exercise Tolerance: Good hypertension, Pt. on medications (-) CAD, (-) Past MI, (-) Cardiac Stents and (-) CABG  Rhythm:Regular Rate:Normal - Systolic murmurs and - Diastolic murmurs    Neuro/Psych neg Seizures negative neurological ROS  negative psych ROS   GI/Hepatic Neg liver ROS, hiatal hernia, GERD  ,  Endo/Other  diabetes (diet controlled)  Renal/GU negative Renal ROS     Musculoskeletal  (+) Arthritis ,   Abdominal (+) - obese,   Peds  Hematology negative hematology ROS (+)   Anesthesia Other Findings Past Medical History: No date: Arthritis     Comment:  mild No date: Diabetes mellitus without complication (HCC)     Comment:  Type II,per PCP Dr. Derrel Nip after visit sumary  diet               controlled No date: GERD (gastroesophageal reflux disease)     Comment:  occasional No date: History of hiatal hernia No date: Hypertension No date: Wears contact lenses   Reproductive/Obstetrics                             Anesthesia Physical Anesthesia Plan  ASA: 2  Anesthesia Plan: General   Post-op Pain Management:    Induction: Intravenous  PONV Risk Score and Plan: 2 and Propofol infusion  Airway Management Planned: Natural Airway  Additional Equipment:   Intra-op Plan:   Post-operative Plan:   Informed Consent: I have reviewed the patients History and Physical, chart,  labs and discussed the procedure including the risks, benefits and alternatives for the proposed anesthesia with the patient or authorized representative who has indicated his/her understanding and acceptance.     Dental advisory given  Plan Discussed with: CRNA and Anesthesiologist  Anesthesia Plan Comments:         Anesthesia Quick Evaluation

## 2021-03-06 NOTE — Op Note (Signed)
[facility] Gastroenterology Patient Name: Kathy [name] Procedure Date: 03/06/2021 12:55 PM MRN: [id number] Account #: [id number] Date of Birth: 06-10-1956 Admit Type: Outpatient Age: 65 Room: [facility] ENDO ROOM 1 Gender: Female Note Status: Finalized Procedure:             Colonoscopy Indications:           Screening in patient at increased risk: Family history                         of 1st-degree relative with colorectal cancer Providers:             Jonathon Bellows MD, MD Referring MD:          Deborra Medina, MD (Referring MD) Medicines:             Monitored Anesthesia Care Complications:         No immediate complications. Procedure:             Pre-Anesthesia Assessment:                        - Prior to the procedure, a History and Physical was                         performed, and patient medications, allergies and                         sensitivities were reviewed. The patient's tolerance                         of previous anesthesia was reviewed.                        - The risks and benefits of the procedure and the                         sedation options and risks were discussed with the                         patient. All questions were answered and informed                         consent was obtained.                        - ASA Grade Assessment: II - A patient with mild                         systemic disease.                        After obtaining informed consent, the colonoscope was                         passed under direct vision. Throughout the procedure,                         the patient's blood pressure, pulse, and oxygen                         saturations were monitored  continuously. The                         Colonoscope was introduced through the anus and                         advanced to the the cecum, identified by the                         appendiceal orifice. The colonoscopy was performed                         with ease.  The patient tolerated the procedure well.                         The quality of the bowel preparation was excellent. Findings:      The perianal and digital rectal examinations were normal.      Multiple small and large-mouthed diverticula were found in the entire       colon.      A 5 mm polyp was found in the sigmoid colon. The polyp was sessile. The       polyp was removed with a cold snare. Resection and retrieval were       complete.      The exam was otherwise without abnormality on direct and retroflexion       views. Impression:            - Diverticulosis in the entire examined colon.                        - One 5 mm polyp in the sigmoid colon, removed with a                         cold snare. Resected and retrieved.                        - The examination was otherwise normal on direct and                         retroflexion views. Recommendation:        - Discharge patient to home (with escort).                        - Resume previous diet.                        - Continue present medications.                        - Await pathology results.                        - Repeat colonoscopy in 5 years for surveillance. Procedure Code(s):     --- Professional ---                        [phone], Colonoscopy, flexible; with removal of                         tumor(s), polyp(s), or other lesion(s) by snare  technique Diagnosis Code(s):     --- Professional ---                        Z80.0, Family history of malignant neoplasm of                         digestive organs                        K63.5, Polyp of colon                        K57.30, Diverticulosis of large intestine without                         perforation or abscess without bleeding CPT copyright 2019 [employer]. All rights reserved. The codes documented in this report are preliminary and upon coder review may  be revised to meet current compliance requirements. Jonathon Bellows, MD Jonathon Bellows MD, MD 03/06/2021 1:23:15 PM This report has been signed electronically. Number of Addenda: 0 Note Initiated On: 03/06/2021 12:55 PM Scope Withdrawal Time: 0 hours 10 minutes 27 seconds  Total Procedure Duration: 0 hours 13 minutes 5 seconds  Estimated Blood Loss:  Estimated blood loss: none.      [facility]

## 2021-03-06 NOTE — H&P (Signed)
Kathy Bellows, MD [address] [place], [place], [place], [address] [address], [place], [place], [place], [address] Phone: [phone]  Fax: [phone]  Primary Care Physician:  Kathy Mc, MD   Pre-Procedure History & Physical: HPI:  Kathy [name] is a 65 y.o. female is here for an colonoscopy.   Past Medical History:  Diagnosis Date   Arthritis    mild   Diabetes mellitus without complication ([place])    Type II,per PCP Dr. 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 Authorizing Provider  Apple Cider Vinegar 300 MG TABS Take 300 mg by mouth daily.  09/28/19  Yes [provider]  Ascorbic Acid (VITAMIN C) 1000 MG tablet Take 1,000 mg by mouth daily.   Yes [provider]  aspirin 81 MG chewable tablet Chew 1 tablet (81 mg total) by mouth 2 (two) times daily. 01/10/20  Yes Mcarthur Rossetti, MD  Cholecalciferol (VITAMIN D3) 10000 UNITS capsule Take 10,000 Units by mouth daily.    Yes [provider]  Collagen Hydrolysate, Bovine, POWD    Yes [provider]   Cranberry 300 MG tablet Take 300 mg by mouth daily.   Yes [provider]  Cyanocobalamin (VITAMIN B-12) 5000 MCG SUBL Take 5,000 mcg by mouth daily.   Yes [provider]  Javier Docker Oil 500 MG CAPS Take 500 mg by mouth daily.   Yes [provider]  MAGNESIUM GLUCONATE PO Take 800 mg by mouth daily.    Yes [provider]  Multiple Vitamins-Minerals (ADULT ONE DAILY GUMMIES PO) Take 2 tablets by mouth daily. Alive Women 50+   Yes [provider]  pravastatin (PRAVACHOL) 20 MG tablet Take 1 tablet (20 mg total) by mouth daily. 07/19/20  Yes Kathy Mc, MD  rosuvastatin (CRESTOR) 20 MG tablet Take 1 tablet (20 mg total) by mouth daily. 01/15/21  Yes Kathy Mc, MD  Saccharomyces boulardii (PROBIOTIC) 250 MG CAPS  08/28/20  Yes [provider]  telmisartan (MICARDIS) 20 MG tablet Take 1 tablet (20 mg total) by mouth at bedtime. 12/08/20  Yes Kathy Mc, MD  Zinc Chelated 22.5 MG TABS Take 1 tablet by mouth daily.    Yes [provider]    Allergies as of 02/07/2021 - Review Complete 02/07/2021  Allergen Reaction Noted   Neosporin [neomycin-bacitracin zn-polymyx] Rash 11/16/2013    Family History  Problem Relation Age of Onset   Hyperlipidemia Mother    Hypertension Mother    Diabetes Mother    Colon cancer Father  Breast cancer Sister 31   Brain cancer Brother     Social History   Socioeconomic History   Marital status: Widowed    Spouse name: Not on file   Number of children: Not on file   Years of education: Not on file   Highest education level: Not on file  Occupational History   Not on file  Tobacco Use   Smoking status: Never   Smokeless tobacco: Never  Vaping Use   Vaping Use: Never used  Substance and Sexual Activity   Alcohol use: Not Currently    Alcohol/week: 10.0 standard drinks    Types: 10 Cans of beer per week    Comment: occasional   Drug use: No   Sexual activity: Not Currently   Other Topics Concern   Not on file  Social History Narrative   Not on file   Social Determinants of Health   Financial Resource Strain: Low Risk    Difficulty of Paying Living Expenses: Not hard at all  Food Insecurity: No Food Insecurity   Worried About Charity fundraiser in the Last Year: Never true   Ran Out of Food in the Last Year: Never true  Transportation Needs: No Transportation Needs   Lack of Transportation (Medical): No   Lack of Transportation (Non-Medical): No  Physical Activity: Sufficiently Active   Days of Exercise per Week: 5 days   Minutes of Exercise per Session: 30 min  Stress: No Stress Concern Present   Feeling of Stress : Not at all  Social Connections: Not on file  Intimate Partner Violence: Not At Risk   Fear of Current or Ex-Partner: No   Emotionally Abused: No   Physically Abused: No   Sexually Abused: No    Review of Systems: See HPI, otherwise negative ROS  Physical Exam: BP (!) 145/83   Pulse 70   Temp (!) 96.7 F (35.9 C) (Temporal)   Resp 20   Ht 5\' 6"  (1.676 m)   Wt 77.1 kg   SpO2 99%   BMI 27.44 kg/m  General:   Alert,  pleasant and cooperative in NAD Head:  Normocephalic and atraumatic. Neck:  Supple; no masses or thyromegaly. Lungs:  Clear throughout to auscultation, normal respiratory effort.    Heart:  +S1, +S2, Regular rate and rhythm, No edema. Abdomen:  Soft, nontender and nondistended. Normal bowel sounds, without guarding, and without rebound.   Neurologic:  Alert and  oriented x4;  grossly normal neurologically.  Impression/Plan: TANEKIA [name] is here for an colonoscopy to be performed for Screening colonoscopy , family history of colon polyps Risks, benefits, limitations, and alternatives regarding  colonoscopy have been reviewed with the patient.  Questions have been answered.  All parties agreeable.   Kathy Bellows, MD  03/06/2021, 12:56 PM

## 2021-03-06 NOTE — Transfer of Care (Signed)
Immediate Anesthesia Transfer of Care Note  Patient: Kathy [name]  Procedure(s) Performed: COLONOSCOPY WITH PROPOFOL  Patient Location: PACU and Endoscopy Unit  Anesthesia Type:General  Level of Consciousness: awake, drowsy and patient cooperative  Airway & Oxygen Therapy: Patient Spontanous Breathing  Post-op Assessment: Report given to RN and Post -op Vital signs reviewed and stable  Post vital signs: Reviewed and stable  Last Vitals:  Vitals Value Taken Time  BP 100/64 03/06/21 1326  Temp 35.8 C 03/06/21 1326  Pulse 64 03/06/21 1329  Resp 18 03/06/21 1329  SpO2 100 % 03/06/21 1329  Vitals shown include unvalidated device data.  Last Pain:  Vitals:   03/06/21 1326  TempSrc: Temporal  PainSc:          Complications: No notable events documented.

## 2021-03-07 ENCOUNTER — Encounter: Payer: Self-pay | Admitting: Gastroenterology

## 2021-03-07 LAB — SURGICAL PATHOLOGY

## 2021-03-07 NOTE — Anesthesia Postprocedure Evaluation (Signed)
Anesthesia Post Note  Patient: Kathy [name]  Procedure(s) Performed: COLONOSCOPY WITH PROPOFOL  Patient location during evaluation: Endoscopy Anesthesia Type: General Level of consciousness: awake and alert and oriented Pain management: pain level controlled Vital Signs Assessment: post-procedure vital signs reviewed and stable Respiratory status: spontaneous breathing, nonlabored ventilation and respiratory function stable Cardiovascular status: blood pressure returned to baseline and stable Postop Assessment: no signs of nausea or vomiting Anesthetic complications: no   No notable events documented.   Last Vitals:  Vitals:   03/06/21 1336 03/06/21 1346  BP: 116/72 133/74  Pulse: (!) 59 65  Resp: 20 14  Temp:    SpO2: 100% 100%    Last Pain:  Vitals:   03/06/21 1346  TempSrc:   PainSc: 0-No pain                 Jeancarlo Leffler

## 2021-03-14 MED ORDER — ROSUVASTATIN CALCIUM 20 MG PO TABS: 20.0000 mg | ORAL_TABLET | Freq: Every day | ORAL | 3 refills | Status: DC

## 2021-03-14 MED ORDER — TELMISARTAN 20 MG PO TABS: 20.0000 mg | ORAL_TABLET | Freq: Every day | ORAL | 1 refills | Status: DC

## 2021-03-16 ENCOUNTER — Encounter: Payer: Self-pay | Admitting: Gastroenterology

## 2021-05-17 DIAGNOSIS — R2231 Localized swelling, mass and lump, right upper limb: Secondary | ICD-10-CM

## 2021-05-24 ENCOUNTER — Ambulatory Visit: Payer: Medicare Other | Admitting: Internal Medicine

## 2021-05-24 NOTE — Telephone Encounter (Signed)
Patient called in stating that she did not receive the message below due to being out of town and will be in town this week and next and she would like to know if she can be worked in.Please advise.

## 2021-05-25 NOTE — Addendum Note (Signed)
Addended by: Crecencio Mc on: 05/25/2021 11:59 AM   Modules accepted: Orders

## 2021-06-01 ENCOUNTER — Ambulatory Visit
Admission: RE | Admit: 2021-06-01 | Discharge: 2021-06-01 | Disposition: A | Discharge status: Home / Self Care | Payer: Medicare Other | Source: Ambulatory Visit | Attending: Internal Medicine | Admitting: Internal Medicine

## 2021-06-01 ENCOUNTER — Other Ambulatory Visit: Payer: Self-pay

## 2021-06-01 DIAGNOSIS — E65 Localized adiposity: Secondary | ICD-10-CM | POA: Diagnosis not present

## 2021-06-01 DIAGNOSIS — R922 Inconclusive mammogram: Secondary | ICD-10-CM | POA: Diagnosis not present

## 2021-06-01 DIAGNOSIS — R2231 Localized swelling, mass and lump, right upper limb: Secondary | ICD-10-CM

## 2021-06-01 DIAGNOSIS — N6331 Unspecified lump in axillary tail of the right breast: Secondary | ICD-10-CM | POA: Diagnosis present

## 2021-06-19 ENCOUNTER — Encounter: Payer: Self-pay | Admitting: Internal Medicine

## 2021-06-19 ENCOUNTER — Other Ambulatory Visit: Payer: Self-pay

## 2021-06-19 ENCOUNTER — Ambulatory Visit (INDEPENDENT_AMBULATORY_CARE_PROVIDER_SITE_OTHER): Payer: Medicare Other | Admitting: Internal Medicine

## 2021-06-19 VITALS — BP 124/80 | HR 89 | Temp 96.0°F | Ht 65.98 in | Wt 170.6 lb

## 2021-06-19 DIAGNOSIS — E1169 Type 2 diabetes mellitus with other specified complication: Secondary | ICD-10-CM | POA: Diagnosis not present

## 2021-06-19 DIAGNOSIS — Z1231 Encounter for screening mammogram for malignant neoplasm of breast: Secondary | ICD-10-CM | POA: Diagnosis not present

## 2021-06-19 DIAGNOSIS — Z8616 Personal history of COVID-19: Secondary | ICD-10-CM | POA: Diagnosis not present

## 2021-06-19 DIAGNOSIS — I10 Essential (primary) hypertension: Secondary | ICD-10-CM | POA: Diagnosis not present

## 2021-06-19 DIAGNOSIS — E559 Vitamin D deficiency, unspecified: Secondary | ICD-10-CM | POA: Diagnosis not present

## 2021-06-19 DIAGNOSIS — E785 Hyperlipidemia, unspecified: Secondary | ICD-10-CM

## 2021-06-19 LAB — LIPID PANEL
Cholesterol: 191 mg/dL (ref 0–200)
HDL: 97.1 mg/dL (ref >39.00)
LDL Cholesterol: 85 mg/dL (ref 0–99)
NonHDL: 93.7
Total CHOL/HDL Ratio: 2
Triglycerides: 42 mg/dL (ref 0.0–149.0)
VLDL: 8.4 mg/dL (ref 0.0–40.0)

## 2021-06-19 LAB — HEMOGLOBIN A1C: Hgb A1c MFr Bld: 6.5 % (ref 4.6–6.5)

## 2021-06-19 LAB — COMPREHENSIVE METABOLIC PANEL
ALT: 16 U/L (ref 0–35)
AST: 19 U/L (ref 0–37)
Albumin: 4.4 g/dL (ref 3.5–5.2)
Alkaline Phosphatase: 92 U/L (ref 39–117)
BUN: 17 mg/dL (ref 6–23)
CO2: 29 mEq/L (ref 19–32)
Calcium: 9.7 mg/dL (ref 8.4–10.5)
Chloride: 102 mEq/L (ref 96–112)
Creatinine, Ser: 0.74 mg/dL (ref 0.40–1.20)
GFR: 84.27 mL/min (ref >60.00)
Glucose, Bld: 96 mg/dL (ref 70–99)
Potassium: 4.3 mEq/L (ref 3.5–5.1)
Sodium: 139 mEq/L (ref 135–145)
Total Bilirubin: 0.4 mg/dL (ref 0.2–1.2)
Total Protein: 6.8 g/dL (ref 6.0–8.3)

## 2021-06-19 LAB — VITAMIN D 25 HYDROXY (VIT D DEFICIENCY, FRACTURES): VITD: 73.34 ng/mL (ref 30.00–100.00)

## 2021-06-19 NOTE — Assessment & Plan Note (Addendum)
She Had a diagnostic mammogram done to evaluate a lump in her right axilla: axillary fat pads were noted, and she will return to annual screening.

## 2021-06-19 NOTE — Progress Notes (Signed)
Subjective:  Patient ID: Francesca Oman, female    DOB: 1955-09-23  Age: 65 y.o. MRN: [id number]  CC: The primary encounter diagnosis was Hyperlipidemia associated with type 2 diabetes mellitus ([place]). Diagnoses of Encounter for screening mammogram for malignant neoplasm of breast, Vitamin D deficiency, History of COVID-19, and Primary hypertension were also pertinent to this visit.  HPI EVYNN BOUTELLE presents for  Chief Complaint  Patient presents with   Follow-up   Diabetes Mellitus   Hyperlipidemia   This visit occurred during the SARS-CoV-2 public health emergency.  Safety protocols were in place, including screening questions prior to the visit, additional usage of staff PPE, and extensive cleaning of exam room while observing appropriate contact time as indicated for disinfecting solutions.    she feels generally well, is exercising 3 days per week and checking blood sugars once daily at variable times.  BS have been under 130 fasting and < 150 post prandially.  Denies any recent hypoglyemic events.  Taking  pravastatin and baby asa  as directed. Following a carbohydrate modified diet 6 days per week. Denies numbness, burning and tingling of extremities. Appetite is good.     Hypertension: patient checks blood pressure twice weekly at home.  Readings have been for the most part < 140/80 at rest . Patient is following a reduced salt diet most days and is taking telmisartan as prescribed    Outpatient Medications Prior to Visit  Medication Sig Dispense Refill   Apple Cider Vinegar 300 MG TABS Take 300 mg by mouth daily.      Ascorbic Acid (VITAMIN C) 1000 MG tablet Take 1,000 mg by mouth daily.     Cholecalciferol (VITAMIN D3) 10000 UNITS capsule Take 10,000 Units by mouth daily.      Collagen Hydrolysate, Bovine, POWD      Cranberry 300 MG tablet Take 300 mg by mouth daily.     Cyanocobalamin (VITAMIN B-12) 5000 MCG SUBL Take 5,000 mcg by mouth daily.     Krill Oil 500 MG CAPS Take  500 mg by mouth daily.     MAGNESIUM GLUCONATE PO Take 800 mg by mouth daily.      Multiple Vitamins-Minerals (ADULT ONE DAILY GUMMIES PO) Take 2 tablets by mouth daily. Alive Women 50+     rosuvastatin (CRESTOR) 20 MG tablet Take 1 tablet (20 mg total) by mouth daily. 90 tablet 3   Saccharomyces boulardii (PROBIOTIC) 250 MG CAPS      telmisartan (MICARDIS) 20 MG tablet Take 1 tablet (20 mg total) by mouth at bedtime. 90 tablet 1   Zinc Chelated 22.5 MG TABS Take 1 tablet by mouth daily.      aspirin 81 MG chewable tablet Chew 1 tablet (81 mg total) by mouth 2 (two) times daily. 30 tablet 0   pravastatin (PRAVACHOL) 20 MG tablet Take 1 tablet (20 mg total) by mouth daily. 90 tablet 3   No facility-administered medications prior to visit.    Review of Systems;  Patient denies headache, fevers, malaise, unintentional weight loss, skin rash, eye pain, sinus congestion and sinus pain, sore throat, dysphagia,  hemoptysis , cough, dyspnea, wheezing, chest pain, palpitations, orthopnea, edema, abdominal pain, nausea, melena, diarrhea, constipation, flank pain, dysuria, hematuria, urinary  Frequency, nocturia, numbness, tingling, seizures,  Focal weakness, Loss of consciousness,  Tremor, insomnia, depression, anxiety, and suicidal ideation.      Objective:  BP 124/80   Pulse 89   Temp (!) 96 F (35.6 C)  Ht 5' 5.98" (1.676 m)   Wt 170 lb 9.6 oz (77.4 kg)   SpO2 99%   BMI 27.55 kg/m   BP Readings from Last 3 Encounters:  06/19/21 124/80  03/06/21 133/74  02/03/21 135/84    Wt Readings from Last 3 Encounters:  06/19/21 170 lb 9.6 oz (77.4 kg)  03/06/21 170 lb (77.1 kg)  02/03/21 173 lb 9.6 oz (78.7 kg)    General appearance: alert, cooperative and appears stated age Ears: normal TM's and external ear canals both ears Throat: lips, mucosa, and tongue normal; teeth and gums normal Neck: no adenopathy, no carotid bruit, supple, symmetrical, trachea midline and thyroid not enlarged,  symmetric, no tenderness/mass/nodules Back: symmetric, no curvature. ROM normal. No CVA tenderness. Lungs: clear to auscultation bilaterally Heart: regular rate and rhythm, S1, S2 normal, no murmur, click, rub or gallop Abdomen: soft, non-tender; bowel sounds normal; no masses,  no organomegaly Pulses: 2+ and symmetric Skin: Skin color, texture, turgor normal. No rashes or lesions Lymph nodes: Cervical, supraclavicular, and axillary nodes normal.  Lab Results  Component Value Date   HGBA1C 6.5 06/19/2021   HGBA1C 6.4 12/22/2020   HGBA1C 6.6 (H) 06/23/2020    Lab Results  Component Value Date   CREATININE 0.74 06/19/2021   CREATININE 0.80 02/15/2021   CREATININE 0.83 12/22/2020    Lab Results  Component Value Date   WBC 7.5 01/10/2020   HGB 9.0 (L) 01/10/2020   HCT 28.3 (L) 01/10/2020   PLT 220 01/10/2020   GLUCOSE 96 06/19/2021   CHOL 191 06/19/2021   TRIG 42.0 06/19/2021   HDL 97.10 06/19/2021   LDLCALC 85 06/19/2021   ALT 16 06/19/2021   AST 19 06/19/2021   NA 139 06/19/2021   K 4.3 06/19/2021   CL 102 06/19/2021   CREATININE 0.74 06/19/2021   BUN 17 06/19/2021   CO2 29 06/19/2021   TSH 2.09 12/17/2019   HGBA1C 6.5 06/19/2021   MICROALBUR 0.9 12/17/2019    US BREAST LTD UNI RIGHT INC AXILLA  Result Date: 06/01/2021 CLINICAL DATA:  65 year old female with a palpable area of concern in the right axilla. History of excision of bilateral axillary fat pads, most recently in 2006. EXAM: DIGITAL DIAGNOSTIC BILATERAL MAMMOGRAM WITH TOMOSYNTHESIS AND CAD; ULTRASOUND RIGHT BREAST LIMITED TECHNIQUE: Bilateral digital diagnostic mammography and breast tomosynthesis was performed. The images were evaluated with computer-aided detection.; Targeted ultrasound examination of the right breast was performed COMPARISON:  Previous exams. ACR Breast Density Category c: The breast tissue is heterogeneously dense, which may obscure small masses. FINDINGS: No suspicious masses or  calcifications seen in either breast. An initially questioned asymmetry in the right breast resolves on the additional imaging with findings compatible with an area of overlapping fibroglandular tissue. Spot compression tomograms performed over the palpable area of concern in the right axilla demonstrating prominent fat pad without underlying palpable masses. Physical examination at site of palpable concern in the right axilla reveals prominent soft fullness. Targeted ultrasound of the right axilla was performed. No suspicious masses or abnormalities identified, only prominent fatty tissue identified. Visualized lymph nodes are normal in morphology. IMPRESSION: 1. Palpable area of concern in the right axilla corresponds with a prominent axillary fat pad. 2.  No mammographic evidence of malignancy in either breast. RECOMMENDATION: Screening mammogram in one year.(Code:SM-B-01Y) I have discussed the findings and recommendations with the patient. If applicable, a reminder letter will be sent to the patient regarding the next appointment. BI-RADS CATEGORY  2: Benign. Electronically Signed  By: Everlean Alstrom M.D.   On: 06/01/2021 16:02  MM DIAG BREAST TOMO BILATERAL  Result Date: 06/01/2021 CLINICAL DATA:  65 year old female with a palpable area of concern in the right axilla. History of excision of bilateral axillary fat pads, most recently in 2006. EXAM: DIGITAL DIAGNOSTIC BILATERAL MAMMOGRAM WITH TOMOSYNTHESIS AND CAD; ULTRASOUND RIGHT BREAST LIMITED TECHNIQUE: Bilateral digital diagnostic mammography and breast tomosynthesis was performed. The images were evaluated with computer-aided detection.; Targeted ultrasound examination of the right breast was performed COMPARISON:  Previous exams. ACR Breast Density Category c: The breast tissue is heterogeneously dense, which may obscure small masses. FINDINGS: No suspicious masses or calcifications seen in either breast. An initially questioned asymmetry in the  right breast resolves on the additional imaging with findings compatible with an area of overlapping fibroglandular tissue. Spot compression tomograms performed over the palpable area of concern in the right axilla demonstrating prominent fat pad without underlying palpable masses. Physical examination at site of palpable concern in the right axilla reveals prominent soft fullness. Targeted ultrasound of the right axilla was performed. No suspicious masses or abnormalities identified, only prominent fatty tissue identified. Visualized lymph nodes are normal in morphology. IMPRESSION: 1. Palpable area of concern in the right axilla corresponds with a prominent axillary fat pad. 2.  No mammographic evidence of malignancy in either breast. RECOMMENDATION: Screening mammogram in one year.(Code:SM-B-01Y) I have discussed the findings and recommendations with the patient. If applicable, a reminder letter will be sent to the patient regarding the next appointment. BI-RADS CATEGORY  2: Benign. Electronically Signed   By: Everlean Alstrom M.D.   On: 06/01/2021 16:02   Assessment & Plan:   Problem List Items Addressed This Visit     Screening for breast cancer    She Had a diagnostic mammogram done to evaluate a lump in her right axilla: axillary fat pads were noted, and she will return to annual screening.       Hypertension    Well controlled on current regimen. Renal function stable, no changes today.  Lab Results  Component Value Date   CREATININE 0.74 06/19/2021   Lab Results  Component Value Date   NA 139 06/19/2021   K 4.3 06/19/2021   CL 102 06/19/2021   CO2 29 06/19/2021   Lab Results  Component Value Date   MICROALBUR 0.9 12/17/2019   MICROALBUR <0.7 11/20/2018           Relevant Medications   aspirin 81 MG chewable tablet   Hyperlipidemia associated with type 2 diabetes mellitus ([place]) - Primary    Diabetes is diet controlled.  She has  aortic atherosclerosis noted on 2020 MRI  abdomen. She has tolerated the change from pravastatin to higher potency statin  And is taking a baby asa daily as well  as telsmisartan for control of hypertension  Lab Results  Component Value Date   CHOL 191 06/19/2021   HDL 97.10 06/19/2021   LDLCALC 85 06/19/2021   TRIG 42.0 06/19/2021   CHOLHDL 2 06/19/2021         Relevant Medications   aspirin 81 MG chewable tablet   Other Relevant Orders   Lipid panel (Completed)   Hemoglobin A1c (Completed)   Comprehensive metabolic panel (Completed)   History of COVID-19   Other Visit Diagnoses     Vitamin D deficiency       Relevant Orders   VITAMIN D 25 Hydroxy (Vit-D Deficiency, Fractures) (Completed)       I have  discontinued Amado Coe. Capizzi's pravastatin. I have also changed her aspirin. Additionally, I am having her maintain her Vitamin D3, Cranberry, vitamin C, MAGNESIUM GLUCONATE PO, Vitamin B-12, Krill Oil, Multiple Vitamins-Minerals (ADULT ONE DAILY GUMMIES PO), Apple Cider Vinegar, Collagen Hydrolysate (Bovine), Zinc Chelated, Probiotic, rosuvastatin, and telmisartan.  Meds ordered this encounter  Medications   aspirin 81 MG chewable tablet    Sig: Chew 1 tablet (81 mg total) by mouth daily.    Dispense:  90 tablet    Refill:  1    Medications Discontinued During This Encounter  Medication Reason   pravastatin (PRAVACHOL) 20 MG tablet    aspirin 81 MG chewable tablet     Follow-up: No follow-ups on file.   Crecencio Mc, MD   Subjective:  Patient ID: Francesca Oman, female    DOB: July 25, 1956  Age: 65 y.o. MRN: [id number]  CC: The primary encounter diagnosis was Hyperlipidemia associated with type 2 diabetes mellitus ([place]). Diagnoses of Encounter for screening mammogram for malignant neoplasm of breast, Vitamin D deficiency, History of COVID-19, and Primary hypertension were also pertinent to this visit.  HPI ZEINA AKKERMAN presents for  Chief Complaint  Patient presents with   Follow-up   Diabetes  Mellitus   Hyperlipidemia      Outpatient Medications Prior to Visit  Medication Sig Dispense Refill   Apple Cider Vinegar 300 MG TABS Take 300 mg by mouth daily.      Ascorbic Acid (VITAMIN C) 1000 MG tablet Take 1,000 mg by mouth daily.     Cholecalciferol (VITAMIN D3) 10000 UNITS capsule Take 10,000 Units by mouth daily.      Collagen Hydrolysate, Bovine, POWD      Cranberry 300 MG tablet Take 300 mg by mouth daily.     Cyanocobalamin (VITAMIN B-12) 5000 MCG SUBL Take 5,000 mcg by mouth daily.     Krill Oil 500 MG CAPS Take 500 mg by mouth daily.     MAGNESIUM GLUCONATE PO Take 800 mg by mouth daily.      Multiple Vitamins-Minerals (ADULT ONE DAILY GUMMIES PO) Take 2 tablets by mouth daily. Alive Women 50+     rosuvastatin (CRESTOR) 20 MG tablet Take 1 tablet (20 mg total) by mouth daily. 90 tablet 3   Saccharomyces boulardii (PROBIOTIC) 250 MG CAPS      telmisartan (MICARDIS) 20 MG tablet Take 1 tablet (20 mg total) by mouth at bedtime. 90 tablet 1   Zinc Chelated 22.5 MG TABS Take 1 tablet by mouth daily.      aspirin 81 MG chewable tablet Chew 1 tablet (81 mg total) by mouth 2 (two) times daily. 30 tablet 0   pravastatin (PRAVACHOL) 20 MG tablet Take 1 tablet (20 mg total) by mouth daily. 90 tablet 3   No facility-administered medications prior to visit.    Review of Systems;  Patient denies headache, fevers, malaise, unintentional weight loss, skin rash, eye pain, sinus congestion and sinus pain, sore throat, dysphagia,  hemoptysis , cough, dyspnea, wheezing, chest pain, palpitations, orthopnea, edema, abdominal pain, nausea, melena, diarrhea, constipation, flank pain, dysuria, hematuria, urinary  Frequency, nocturia, numbness, tingling, seizures,  Focal weakness, Loss of consciousness,  Tremor, insomnia, depression, anxiety, and suicidal ideation.      Objective:  BP 124/80   Pulse 89   Temp (!) 96 F (35.6 C)   Ht 5' 5.98" (1.676 m)   Wt 170 lb 9.6 oz (77.4 kg)   SpO2  99%   BMI  27.55 kg/m   BP Readings from Last 3 Encounters:  06/19/21 124/80  03/06/21 133/74  02/03/21 135/84    Wt Readings from Last 3 Encounters:  06/19/21 170 lb 9.6 oz (77.4 kg)  03/06/21 170 lb (77.1 kg)  02/03/21 173 lb 9.6 oz (78.7 kg)    General appearance: alert, cooperative and appears stated age Ears: normal TM's and external ear canals both ears Throat: lips, mucosa, and tongue normal; teeth and gums normal Neck: no adenopathy, no carotid bruit, supple, symmetrical, trachea midline and thyroid not enlarged, symmetric, no tenderness/mass/nodules Back: symmetric, no curvature. ROM normal. No CVA tenderness. Lungs: clear to auscultation bilaterally Heart: regular rate and rhythm, S1, S2 normal, no murmur, click, rub or gallop Abdomen: soft, non-tender; bowel sounds normal; no masses,  no organomegaly Pulses: 2+ and symmetric Skin: Skin color, texture, turgor normal. No rashes or lesions Lymph nodes: Cervical, supraclavicular, and axillary nodes normal.  Lab Results  Component Value Date   HGBA1C 6.5 06/19/2021   HGBA1C 6.4 12/22/2020   HGBA1C 6.6 (H) 06/23/2020    Lab Results  Component Value Date   CREATININE 0.74 06/19/2021   CREATININE 0.80 02/15/2021   CREATININE 0.83 12/22/2020    Lab Results  Component Value Date   WBC 7.5 01/10/2020   HGB 9.0 (L) 01/10/2020   HCT 28.3 (L) 01/10/2020   PLT 220 01/10/2020   GLUCOSE 96 06/19/2021   CHOL 191 06/19/2021   TRIG 42.0 06/19/2021   HDL 97.10 06/19/2021   LDLCALC 85 06/19/2021   ALT 16 06/19/2021   AST 19 06/19/2021   NA 139 06/19/2021   K 4.3 06/19/2021   CL 102 06/19/2021   CREATININE 0.74 06/19/2021   BUN 17 06/19/2021   CO2 29 06/19/2021   TSH 2.09 12/17/2019   HGBA1C 6.5 06/19/2021   MICROALBUR 0.9 12/17/2019    US BREAST LTD UNI RIGHT INC AXILLA  Result Date: 06/01/2021 CLINICAL DATA:  65 year old female with a palpable area of concern in the right axilla. History of excision of  bilateral axillary fat pads, most recently in 2006. EXAM: DIGITAL DIAGNOSTIC BILATERAL MAMMOGRAM WITH TOMOSYNTHESIS AND CAD; ULTRASOUND RIGHT BREAST LIMITED TECHNIQUE: Bilateral digital diagnostic mammography and breast tomosynthesis was performed. The images were evaluated with computer-aided detection.; Targeted ultrasound examination of the right breast was performed COMPARISON:  Previous exams. ACR Breast Density Category c: The breast tissue is heterogeneously dense, which may obscure small masses. FINDINGS: No suspicious masses or calcifications seen in either breast. An initially questioned asymmetry in the right breast resolves on the additional imaging with findings compatible with an area of overlapping fibroglandular tissue. Spot compression tomograms performed over the palpable area of concern in the right axilla demonstrating prominent fat pad without underlying palpable masses. Physical examination at site of palpable concern in the right axilla reveals prominent soft fullness. Targeted ultrasound of the right axilla was performed. No suspicious masses or abnormalities identified, only prominent fatty tissue identified. Visualized lymph nodes are normal in morphology. IMPRESSION: 1. Palpable area of concern in the right axilla corresponds with a prominent axillary fat pad. 2.  No mammographic evidence of malignancy in either breast. RECOMMENDATION: Screening mammogram in one year.(Code:SM-B-01Y) I have discussed the findings and recommendations with the patient. If applicable, a reminder letter will be sent to the patient regarding the next appointment. BI-RADS CATEGORY  2: Benign. Electronically Signed   By: Everlean Alstrom M.D.   On: 06/01/2021 16:02  MM DIAG BREAST TOMO BILATERAL  Result Date: 06/01/2021  CLINICAL DATA:  65 year old female with a palpable area of concern in the right axilla. History of excision of bilateral axillary fat pads, most recently in 2006. EXAM: DIGITAL DIAGNOSTIC  BILATERAL MAMMOGRAM WITH TOMOSYNTHESIS AND CAD; ULTRASOUND RIGHT BREAST LIMITED TECHNIQUE: Bilateral digital diagnostic mammography and breast tomosynthesis was performed. The images were evaluated with computer-aided detection.; Targeted ultrasound examination of the right breast was performed COMPARISON:  Previous exams. ACR Breast Density Category c: The breast tissue is heterogeneously dense, which may obscure small masses. FINDINGS: No suspicious masses or calcifications seen in either breast. An initially questioned asymmetry in the right breast resolves on the additional imaging with findings compatible with an area of overlapping fibroglandular tissue. Spot compression tomograms performed over the palpable area of concern in the right axilla demonstrating prominent fat pad without underlying palpable masses. Physical examination at site of palpable concern in the right axilla reveals prominent soft fullness. Targeted ultrasound of the right axilla was performed. No suspicious masses or abnormalities identified, only prominent fatty tissue identified. Visualized lymph nodes are normal in morphology. IMPRESSION: 1. Palpable area of concern in the right axilla corresponds with a prominent axillary fat pad. 2.  No mammographic evidence of malignancy in either breast. RECOMMENDATION: Screening mammogram in one year.(Code:SM-B-01Y) I have discussed the findings and recommendations with the patient. If applicable, a reminder letter will be sent to the patient regarding the next appointment. BI-RADS CATEGORY  2: Benign. Electronically Signed   By: Everlean Alstrom M.D.   On: 06/01/2021 16:02   Assessment & Plan:   Problem List Items Addressed This Visit     Screening for breast cancer    She Had a diagnostic mammogram done to evaluate a lump in her right axilla: axillary fat pads were noted, and she will return to annual screening.       Hypertension    Well controlled on current regimen. Renal function  stable, no changes today.  Lab Results  Component Value Date   CREATININE 0.74 06/19/2021   Lab Results  Component Value Date   NA 139 06/19/2021   K 4.3 06/19/2021   CL 102 06/19/2021   CO2 29 06/19/2021   Lab Results  Component Value Date   MICROALBUR 0.9 12/17/2019   MICROALBUR <0.7 11/20/2018           Relevant Medications   aspirin 81 MG chewable tablet   Hyperlipidemia associated with type 2 diabetes mellitus ([place]) - Primary    Diabetes is diet controlled.  She has  aortic atherosclerosis noted on 2020 MRI abdomen. She has tolerated the change from pravastatin to higher potency statin  And is taking a baby asa daily as well  as telsmisartan for control of hypertension  Lab Results  Component Value Date   CHOL 191 06/19/2021   HDL 97.10 06/19/2021   LDLCALC 85 06/19/2021   TRIG 42.0 06/19/2021   CHOLHDL 2 06/19/2021         Relevant Medications   aspirin 81 MG chewable tablet   Other Relevant Orders   Lipid panel (Completed)   Hemoglobin A1c (Completed)   Comprehensive metabolic panel (Completed)   History of COVID-19   Other Visit Diagnoses     Vitamin D deficiency       Relevant Orders   VITAMIN D 25 Hydroxy (Vit-D Deficiency, Fractures) (Completed)      I provided  30 minutes of  face-to-face time during this encounter reviewing patient's current problems and past surgeries, labs and imaging studies,  providing counseling on the above mentioned problems , and coordination  of care .   Meds ordered this encounter  Medications   aspirin 81 MG chewable tablet    Sig: Chew 1 tablet (81 mg total) by mouth daily.    Dispense:  90 tablet    Refill:  1    Medications Discontinued During This Encounter  Medication Reason   pravastatin (PRAVACHOL) 20 MG tablet    aspirin 81 MG chewable tablet     Follow-up: No follow-ups on file.   Crecencio Mc, MD

## 2021-06-20 MED ORDER — ASPIRIN 81 MG PO CHEW: 81.0000 mg | CHEWABLE_TABLET | Freq: Every day | ORAL | 1 refills | Status: AC

## 2021-06-20 NOTE — Assessment & Plan Note (Signed)
Well controlled on current regimen. Renal function stable, no changes today.  Lab Results  Component Value Date   CREATININE 0.74 06/19/2021   Lab Results  Component Value Date   NA 139 06/19/2021   K 4.3 06/19/2021   CL 102 06/19/2021   CO2 29 06/19/2021   Lab Results  Component Value Date   MICROALBUR 0.9 12/17/2019   MICROALBUR <0.7 11/20/2018

## 2021-06-20 NOTE — Assessment & Plan Note (Addendum)
Diabetes is diet controlled.  She has  aortic atherosclerosis noted on 2020 MRI abdomen. She has tolerated the change from pravastatin to higher potency statin  And is taking a baby asa daily as well  as telsmisartan for control of hypertension  Lab Results  Component Value Date   CHOL 191 06/19/2021   HDL 97.10 06/19/2021   LDLCALC 85 06/19/2021   TRIG 42.0 06/19/2021   CHOLHDL 2 06/19/2021

## 2021-06-23 ENCOUNTER — Ambulatory Visit: Payer: Medicare Other | Admitting: Internal Medicine

## 2021-09-08 ENCOUNTER — Encounter: Payer: Self-pay | Admitting: Internal Medicine

## 2021-09-08 MED ORDER — TELMISARTAN 20 MG PO TABS: 20.0000 mg | ORAL_TABLET | Freq: Every day | ORAL | 1 refills | Status: DC

## 2022-02-07 ENCOUNTER — Other Ambulatory Visit: Payer: Self-pay

## 2022-02-07 ENCOUNTER — Ambulatory Visit (INDEPENDENT_AMBULATORY_CARE_PROVIDER_SITE_OTHER): Payer: Medicare Other

## 2022-02-07 VITALS — Ht 65.98 in | Wt 139.0 lb

## 2022-02-07 DIAGNOSIS — Z0000 Encounter for general adult medical examination without abnormal findings: Secondary | ICD-10-CM | POA: Diagnosis not present

## 2022-02-07 DIAGNOSIS — E1169 Type 2 diabetes mellitus with other specified complication: Secondary | ICD-10-CM

## 2022-02-07 DIAGNOSIS — E559 Vitamin D deficiency, unspecified: Secondary | ICD-10-CM

## 2022-02-07 NOTE — Patient Instructions (Addendum)
  Kathy [name] , Thank you for taking time to come for your Medicare Wellness Visit. I appreciate your ongoing commitment to your health goals. Please review the following plan we discussed and let me know if I can assist you in the future.   These are the goals we discussed:  Goals       Patient Stated     Maintain Healthy Lifestyle (pt-stated)      Stay active Healthy diet         This is a list of the screening recommended for you and due dates:  Health Maintenance  Topic Date Due   Hemoglobin A1C  02/14/2022*   DEXA scan (bone density measurement)  07/27/2022*   Flu Shot  03/27/2022   Eye exam for diabetics  03/27/2022   Mammogram  06/01/2022   Complete foot exam   06/19/2022   Tetanus Vaccine  11/17/2023   Colon Cancer Screening  03/06/2026   Pneumonia Vaccine  Completed   Hepatitis C Screening: USPSTF Recommendation to screen - Ages 18-79 yo.  Completed   Zoster (Shingles) Vaccine  Completed   HPV Vaccine  Aged Out   COVID-19 Vaccine  Discontinued  *Topic was postponed. The date shown is not the original due date.

## 2022-02-07 NOTE — Progress Notes (Addendum)
Subjective:   Kathy [name] is a 66 y.o. female who presents for Medicare Annual (Subsequent) preventive examination.  Review of Systems    No ROS.  Medicare Wellness Virtual Visit.  Visual/audio telehealth visit, UTA vital signs.   See social history for additional risk factors.   Cardiac Risk Factors include: advanced age (>70mn, >>34women)     Objective:    Today's Vitals   02/07/22 0821  Weight: 139 lb (63 kg)  Height: 5' 5.98" (1.676 m)   Body mass index is 22.45 kg/m.     02/07/2022    8:38 AM 03/06/2021   12:03 PM 02/03/2021   10:43 AM 01/08/2020   12:15 PM 12/30/2019    2:22 PM 08/07/2019    1:35 PM 08/05/2019   10:26 AM  Advanced Directives  Does Patient Have a Medical Advance Directive? Yes Yes Yes Yes Yes No No  Type of AParamedicof ACastleberryLiving will Living will HWillistonLiving will HCanbyLiving will HFreeportLiving will    Does patient want to make changes to medical advance directive? No - Patient declined  No - Patient declined No - Patient declined No - Patient declined    Copy of HBrim[place]in Chart? No - copy requested  No - copy requested No - copy requested No - copy requested    Would patient like information on creating a medical advance directive?      Yes (MAU/Ambulatory/Procedural Areas - Information given) Yes (MAU/Ambulatory/Procedural Areas - Information given)    Current Medications (verified) Outpatient Encounter Medications as of 02/07/2022  Medication Sig   Apple Cider Vinegar 300 MG TABS Take 300 mg by mouth daily.    Ascorbic Acid (VITAMIN C) 1000 MG tablet Take 1,000 mg by mouth daily.   aspirin 81 MG chewable tablet Chew 1 tablet (81 mg total) by mouth daily.   Cholecalciferol (VITAMIN D3) 10000 UNITS capsule Take 10,000 Units by mouth daily.    Collagen Hydrolysate, Bovine, POWD    Cranberry 300 MG tablet Take 300 mg by mouth  daily.   Cyanocobalamin (VITAMIN B-12) 5000 MCG SUBL Take 5,000 mcg by mouth daily.   Krill Oil 500 MG CAPS Take 500 mg by mouth daily.   MAGNESIUM GLUCONATE PO Take 800 mg by mouth daily.    Multiple Vitamins-Minerals (ADULT ONE DAILY GUMMIES PO) Take 2 tablets by mouth daily. Alive Women 50+   rosuvastatin (CRESTOR) 20 MG tablet Take 1 tablet (20 mg total) by mouth daily.   Saccharomyces boulardii (PROBIOTIC) 250 MG CAPS    telmisartan (MICARDIS) 20 MG tablet Take 1 tablet (20 mg total) by mouth at bedtime.   Zinc Chelated 22.5 MG TABS Take 1 tablet by mouth daily.    No facility-administered encounter medications on file as of 02/07/2022.   Allergies (verified) Neosporin [neomycin-bacitracin zn-polymyx]   History: Past Medical History:  Diagnosis Date   Arthritis    mild   Diabetes mellitus without complication (HHarding    Type II,per PCP Dr. TDerrel Nipafter visit sumary  diet controlled   GERD (gastroesophageal reflux disease)    occasional   History of hiatal hernia    Hypertension    Wears contact lenses    Past Surgical History:  Procedure Laterality Date   COLONOSCOPY  2006   COLONOSCOPY N/A 01/03/2016   Procedure: COLONOSCOPY;  Surgeon: PHulen Luster MD;  [facility]  Service: Gastroenterology;  Laterality:  N/A;   COLONOSCOPY WITH PROPOFOL N/A 03/06/2021   Procedure: COLONOSCOPY WITH PROPOFOL;  Surgeon: Jonathon Bellows, MD;  [facility];  Service: Gastroenterology;  Laterality: N/A;   fatty tissue under bilateral arm     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   Procedure: RIGHT TOTAL HIP ARTHROPLASTY ANTERIOR APPROACH;  Surgeon: Mcarthur Rossetti, MD;  Location: [facility];  Service: Orthopedics;  Laterality: Right;   TOTAL HIP ARTHROPLASTY Left 01/08/2020   Procedure: LEFT TOTAL HIP ARTHROPLASTY ANTERIOR APPROACH;  Surgeon: Mcarthur Rossetti, MD;  Location: [facility];  Service: Orthopedics;  Laterality: Left;   Family History  Problem Relation  Age of Onset   Hyperlipidemia Mother    Hypertension Mother    Diabetes Mother    Colon cancer Father    Breast cancer Sister 66   Brain cancer Brother    Social History   Socioeconomic History   Marital status: Widowed    Spouse name: Not on file   Number of children: Not on file   Years of education: Not on file   Highest education level: Not on file  Occupational History   Not on file  Tobacco Use   Smoking status: Never   Smokeless tobacco: Never  Vaping Use   Vaping Use: Never used  Substance and Sexual Activity   Alcohol use: Not Currently    Alcohol/week: 10.0 standard drinks of alcohol    Types: 10 Cans of beer per week    Comment: occasional   Drug use: No   Sexual activity: Not Currently  Other Topics Concern   Not on file  Social History Narrative   Not on file   Social Determinants of Health   Financial Resource Strain: Low Risk  (02/07/2022)   Overall Financial Resource Strain (CARDIA)    Difficulty of Paying Living Expenses: Not hard at all  Food Insecurity: No Food Insecurity (02/07/2022)   Hunger Vital Sign    Worried About Running Out of Food in the Last Year: Never true    Ran Out of Food in the Last Year: Never true  Transportation Needs: No Transportation Needs (02/07/2022)   PRAPARE - Hydrologist (Medical): No    Lack of Transportation (Non-Medical): No  Physical Activity: Sufficiently Active (02/07/2022)   Exercise Vital Sign    Days of Exercise per Week: 3 days    Minutes of Exercise per Session: 60 min  Stress: No Stress Concern Present (02/07/2022)   [place]    Feeling of Stress : Not at all  Social Connections: Not on file   Tobacco Counseling Counseling given: Not Answered   Clinical Intake: Pre-visit preparation completed: Yes       Diabetes: Yes (Followed by PCP)  How often do you need to have someone help you when you read  instructions, pamphlets, or other written materials from your doctor or pharmacy?: 1 - Never   Interpreter Needed?: No    Activities of Daily Living    02/07/2022    8:28 AM  In your present state of health, do you have any difficulty performing the following activities:  Hearing? 0  Vision? 0  Difficulty concentrating or making decisions? 0  Walking or climbing stairs? 0  Dressing or bathing? 0  Doing errands, shopping? 0  Preparing Food and eating ? N  Using the Toilet? N  In the past six months, have you accidently leaked urine? N  Do  you have problems with loss of bowel control? N  Managing your Medications? N  Managing your Finances? N  Housekeeping or managing your Housekeeping? N   Patient Care Team: Crecencio Mc, MD as PCP - General (Internal Medicine) Crecencio Mc, MD (Internal Medicine) Bary Castilla Forest Gleason, MD (General Surgery)  Indicate any recent Medical Services you may have received from other than [facility] providers in the past year (date may be approximate).     Assessment:   This is a routine wellness examination for Rebecca.  Virtual Visit via Telephone Note  I connected with  Francesca Oman on 02/07/22 at  8:15 AM EDT by telephone and verified that I am speaking with the correct person using two identifiers.  Persons participating in the virtual visit: patient/Nurse Health Advisor   I discussed the limitations of performing an evaluation and management service by telehealth. We continued and completed visit with audio only. Some vital signs may be absent or patient reported.   Hearing/Vision screen Hearing Screening - Comments:: Patient is able to hear conversational tones without difficulty. No issues reported. Vision Screening - Comments:: Wears corrective lenses. They have seen their ophthalmologist in the last 12 months.   Dietary issues and exercise activities discussed: Current Exercise Habits: Home exercise routine, Type of exercise: strength  training/weights;stretching;treadmill;walking (walking 10-12 steps daily), Time (Minutes): 60, Frequency (Times/Week): 3, Weekly Exercise (Minutes/Week): 180, Intensity: Moderate Optavia diet Good water intake   Goals Addressed               This Visit's Progress     Patient Stated     Maintain Healthy Lifestyle (pt-stated)        Stay active Healthy diet        Depression Screen    02/07/2022    8:27 AM 06/19/2021    9:39 AM 02/03/2021   11:13 AM 12/22/2020    9:13 AM 12/17/2019    8:54 AM 12/17/2019    8:45 AM 05/14/2018    8:40 AM  PHQ 2/9 Scores  PHQ - 2 Score 0 0 0 0 0 0 0  PHQ- 9 Score   0 0 0      Fall Risk    02/07/2022    8:28 AM 06/19/2021    9:39 AM 02/03/2021   10:44 AM 12/22/2020    9:13 AM 06/23/2020    8:40 AM  Fall Risk   Falls in the past year? 0 0 0 0 0  Number falls in past yr:  0 0    Injury with Fall?  0 0    Follow up Falls evaluation completed Falls evaluation completed Falls evaluation completed Falls evaluation completed Falls evaluation completed   [place]: Home free of loose throw rugs in walkways, pet beds, electrical cords, etc? Yes  Adequate lighting in your home to reduce risk of falls? Yes   ASSISTIVE DEVICES UTILIZED TO PREVENT FALLS: Life alert? No  Use of a cane, walker or w/c? No   TIMED UP AND GO: Was the test performed? No .   Cognitive Function:  Patient is alert and oriented x3.  Enjoys reading and other brain health stimulating activities.       Immunizations Immunization History  Administered Date(s) Administered   PNEUMOCOCCAL CONJUGATE-20 12/22/2020   Tdap 11/16/2013   Zoster Recombinat (Shingrix) 12/08/2020, 04/13/2021   Screening Tests Health Maintenance  Topic Date Due   HEMOGLOBIN A1C  02/14/2022 (Originally 12/18/2021)   DEXA  SCAN  07/27/2022 (Originally 12/27/2019)   INFLUENZA VACCINE  03/27/2022   OPHTHALMOLOGY EXAM  03/27/2022   MAMMOGRAM  06/01/2022   FOOT  EXAM  06/19/2022   TETANUS/TDAP  11/17/2023   COLONOSCOPY (Pts 45-54yr Insurance coverage will need to be confirmed)  03/06/2026   Pneumonia Vaccine 66 Years old  Completed   Hepatitis C Screening  Completed   Zoster Vaccines- Shingrix  Completed   HPV VACCINES  Aged Out   COVID-19 Vaccine  Discontinued   Health Maintenance There are no preventive care reminders to display for this patient.  Bone density- deferred per patient.  Lung Cancer Screening: (Low Dose CT Chest recommended if Age 67-80years, 30 pack-year currently smoking OR have quit w/in 15years.) does not qualify.   Vision Screening: Recommended annual ophthalmology exams for early detection of glaucoma and other disorders of the eye.  Dental Screening: Recommended annual dental exams for proper oral hygiene. Visits every 6 months.   Community Resource Referral / Chronic Care Management: CRR required this visit?  No   CCM required this visit?  No      Plan:   Keep all routine maintenance appointments.   I have personally reviewed and noted the following in the patient's chart:   Medical and social history Use of alcohol, tobacco or illicit drugs  Current medications and supplements including opioid prescriptions.  Functional ability and status Nutritional status Physical activity Advanced directives List of other physicians Hospitalizations, surgeries, and ER visits in previous 12 months Vitals Screenings to include cognitive, depression, and falls Referrals and appointments  In addition, I have reviewed and discussed with patient certain preventive protocols, quality metrics, and best practice recommendations. A written personalized care plan for preventive services as well as general preventive health recommendations were provided to patient.     OBrien-Blaney, Shakenna Herrero L, LPN   [id number]    I have reviewed the above information and agree with above.   TDeborra Medina MD

## 2022-02-07 NOTE — Progress Notes (Signed)
Future labs ordered prior to upcoming appointment per patient request.

## 2022-02-15 ENCOUNTER — Other Ambulatory Visit: Payer: Medicare Other

## 2022-02-22 ENCOUNTER — Telehealth: Payer: Self-pay | Admitting: *Deleted

## 2022-02-22 ENCOUNTER — Other Ambulatory Visit (INDEPENDENT_AMBULATORY_CARE_PROVIDER_SITE_OTHER): Payer: Medicare Other

## 2022-02-22 ENCOUNTER — Other Ambulatory Visit: Payer: Self-pay

## 2022-02-22 DIAGNOSIS — E785 Hyperlipidemia, unspecified: Secondary | ICD-10-CM

## 2022-02-22 DIAGNOSIS — E559 Vitamin D deficiency, unspecified: Secondary | ICD-10-CM | POA: Diagnosis not present

## 2022-02-22 DIAGNOSIS — E673 Hypervitaminosis D: Secondary | ICD-10-CM | POA: Insufficient documentation

## 2022-02-22 DIAGNOSIS — E1169 Type 2 diabetes mellitus with other specified complication: Secondary | ICD-10-CM | POA: Diagnosis not present

## 2022-02-22 LAB — COMPREHENSIVE METABOLIC PANEL
ALT: 16 U/L (ref 0–35)
AST: 18 U/L (ref 0–37)
Albumin: 4.3 g/dL (ref 3.5–5.2)
Alkaline Phosphatase: 71 U/L (ref 39–117)
BUN: 28 mg/dL — ABNORMAL HIGH (ref 6–23)
CO2: 29 mEq/L (ref 19–32)
Calcium: 9.4 mg/dL (ref 8.4–10.5)
Chloride: 103 mEq/L (ref 96–112)
Creatinine, Ser: 0.82 mg/dL (ref 0.40–1.20)
GFR: 74.15 mL/min (ref >60.00)
Glucose, Bld: 97 mg/dL (ref 70–99)
Potassium: 3.8 mEq/L (ref 3.5–5.1)
Sodium: 138 mEq/L (ref 135–145)
Total Bilirubin: 0.5 mg/dL (ref 0.2–1.2)
Total Protein: 6.7 g/dL (ref 6.0–8.3)

## 2022-02-22 LAB — LIPID PANEL
Cholesterol: 174 mg/dL (ref 0–200)
HDL: 95.6 mg/dL (ref >39.00)
LDL Cholesterol: 72 mg/dL (ref 0–99)
NonHDL: 78.51
Total CHOL/HDL Ratio: 2
Triglycerides: 31 mg/dL (ref 0.0–149.0)
VLDL: 6.2 mg/dL (ref 0.0–40.0)

## 2022-02-22 LAB — HEMOGLOBIN A1C: Hgb A1c MFr Bld: 6.2 % (ref 4.6–6.5)

## 2022-02-22 LAB — VITAMIN D 25 HYDROXY (VIT D DEFICIENCY, FRACTURES): VITD: >120 ng/mL

## 2022-02-22 NOTE — Telephone Encounter (Signed)
CRITICAL VALUE STICKER  CRITICAL VALUE: Vitamin D > 120 (H)  RECEIVER (on-site recipient of call): Jari Favre, CMA  DATE & TIME NOTIFIED: 02/22/22 @ 11am  MESSENGER (representative from lab): Saa  MD NOTIFIED: Dr. Derrel Nip  TIME OF NOTIFICATION:11:03am  RESPONSE:  see results

## 2022-02-22 NOTE — Telephone Encounter (Signed)
Pt called and notified to stop all vitamin d sources. She stated that she would & she is scheduled in one month to recheck.

## 2022-02-22 NOTE — Assessment & Plan Note (Signed)
SECONDARY TO OVER SUPPLEMENTATION.  ALL SOURCES STOPPED

## 2022-02-22 NOTE — Telephone Encounter (Signed)
Apparently she has been taking 10,000 Ius of D3 daily and now she has a CRITICALLY HIGH LEVEL.   PLEASE STOP ALL SOURCES OF SUPPLEMENTAL VITAMIN D   AND WE WILL RECHECK LEVEL IN ONE MONTH  Her calcium levels are fine,  though, and her other labs are excellent ,  including her a1c which is now 6.2

## 2022-02-28 ENCOUNTER — Encounter: Payer: Self-pay | Admitting: Internal Medicine

## 2022-02-28 ENCOUNTER — Ambulatory Visit (INDEPENDENT_AMBULATORY_CARE_PROVIDER_SITE_OTHER): Payer: Medicare Other | Admitting: Internal Medicine

## 2022-02-28 VITALS — BP 110/70 | HR 75 | Temp 98.0°F | Resp 21 | Ht 65.0 in | Wt 136.2 lb

## 2022-02-28 DIAGNOSIS — E119 Type 2 diabetes mellitus without complications: Secondary | ICD-10-CM

## 2022-02-28 DIAGNOSIS — E673 Hypervitaminosis D: Secondary | ICD-10-CM | POA: Diagnosis not present

## 2022-02-28 DIAGNOSIS — E785 Hyperlipidemia, unspecified: Secondary | ICD-10-CM

## 2022-02-28 DIAGNOSIS — E1169 Type 2 diabetes mellitus with other specified complication: Secondary | ICD-10-CM

## 2022-02-28 DIAGNOSIS — I700 Atherosclerosis of aorta: Secondary | ICD-10-CM

## 2022-02-28 DIAGNOSIS — R799 Abnormal finding of blood chemistry, unspecified: Secondary | ICD-10-CM | POA: Diagnosis not present

## 2022-02-28 DIAGNOSIS — Z1231 Encounter for screening mammogram for malignant neoplasm of breast: Secondary | ICD-10-CM

## 2022-02-28 NOTE — Assessment & Plan Note (Signed)
New, occurring in the fasting state..  Repeating today

## 2022-02-28 NOTE — Assessment & Plan Note (Signed)
She has lowered her a1c to 6.2 with OPtavia diet and lost 30 lbs.  Microalb/cr ratio needed  Lab Results  Component Value Date   HGBA1C 6.2 02/22/2022   Lab Results  Component Value Date   MICROALBUR 0.9 12/17/2019   MICROALBUR <0.7 11/20/2018

## 2022-02-28 NOTE — Patient Instructions (Addendum)
Congratulations!  You have reached all of your goals !   If protein test is negative today,   we will stop telmisartan  and follow BP readings   Try using turmeric for your joint pain  available OTC   The BUN can become elevated by mild dehydration.  We are rechecking this blood test today  Stay off the vitamin D until you return in 6 months

## 2022-02-28 NOTE — Assessment & Plan Note (Signed)
LDL is at goal on high potency statin  Lab Results  Component Value Date   CHOL 174 02/22/2022   HDL 95.60 02/22/2022   LDLCALC 72 02/22/2022   TRIG 31.0 02/22/2022   CHOLHDL 2 02/22/2022

## 2022-02-28 NOTE — Progress Notes (Signed)
Subjective:  Patient ID: Kathy [name], female    DOB: 1956/06/04  Age: 66 y.o. MRN: [id number]  CC: The primary encounter diagnosis was Elevated BUN. Diagnoses of Diet-controlled diabetes mellitus ([place]), Abdominal aortic atherosclerosis ([place]), Hypervitaminosis D, Breast cancer screening by mammogram, and Hyperlipidemia associated with type 2 diabetes mellitus ([place]) were also pertinent to this visit.   HPI [name] presents for follow up on diet controlled diabetes,  hypertension and hyperlipidemia  Chief Complaint  Patient presents with   Follow-up    Follow up    Lab Results  Component Value Date   MICROALBUR 0.9 12/17/2019   MICROALBUR <0.7 11/20/2018     1) Type 2 DM:  she feels y well, is exercising several times per week and has lost 30 lbs on the [place]. Moving to maintenance mode . Taking rosuvastatin and telmisartan without side effects.   Denies numbness, burning and tingling of extremities. Appetite is diminished due to dietary reductions .    2) HTN:  Patient is taking her medications as prescribed and notes no adverse effects.  Home BP readings have been done about once per week and are  generally < 120/70 .  She is avoiding added salt in her diet and exercising regularly about 3 times per week for exercise  .        Outpatient Medications Prior to Visit  Medication Sig Dispense Refill   Apple Cider Vinegar 300 MG TABS Take 300 mg by mouth daily.      Ascorbic Acid (VITAMIN C) 1000 MG tablet Take 1,000 mg by mouth daily.     aspirin 81 MG chewable tablet Chew 1 tablet (81 mg total) by mouth daily. 90 tablet 1   Collagen Hydrolysate, Bovine, POWD      Cranberry 300 MG tablet Take 300 mg by mouth daily.     Cyanocobalamin (VITAMIN B-12) 5000 MCG SUBL Take 5,000 mcg by mouth daily.     Krill Oil 500 MG CAPS Take 500 mg by mouth daily.     MAGNESIUM GLUCONATE PO Take 800 mg by mouth daily.      Multiple Vitamins-Minerals (ADULT ONE DAILY GUMMIES PO) Take 2  tablets by mouth daily. Alive Women 50+     rosuvastatin (CRESTOR) 20 MG tablet Take 1 tablet (20 mg total) by mouth daily. 90 tablet 3   Saccharomyces boulardii (PROBIOTIC) 250 MG CAPS      telmisartan (MICARDIS) 20 MG tablet Take 1 tablet (20 mg total) by mouth at bedtime. 90 tablet 1   Zinc Chelated 22.5 MG TABS Take 1 tablet by mouth daily.      No facility-administered medications prior to visit.    Review of Systems;  Patient denies headache, fevers, malaise, unintentional weight loss, skin rash, eye pain, sinus congestion and sinus pain, sore throat, dysphagia,  hemoptysis , cough, dyspnea, wheezing, chest pain, palpitations, orthopnea, edema, abdominal pain, nausea, melena, diarrhea, constipation, flank pain, dysuria, hematuria, urinary  Frequency, nocturia, numbness, tingling, seizures,  Focal weakness, Loss of consciousness,  Tremor, insomnia, depression, anxiety, and suicidal ideation.      Objective:  BP 110/70 (BP Location: Left Arm, Patient Position: Sitting, Cuff Size: Normal)   Pulse 75   Temp 98 F (36.7 C) (Oral)   Resp (!) 21   Ht '5\' 5"'$  (1.651 m)   Wt 136 lb 3.2 oz (61.8 kg)   SpO2 98%   BMI 22.66 kg/m   BP Readings from Last 3 Encounters:  02/28/22  110/70  06/19/21 124/80  03/06/21 133/74    Wt Readings from Last 3 Encounters:  02/28/22 136 lb 3.2 oz (61.8 kg)  02/07/22 139 lb (63 kg)  06/19/21 170 lb 9.6 oz (77.4 kg)    General appearance: alert, cooperative and appears stated age Ears: normal TM's and external ear canals both ears Throat: lips, mucosa, and tongue normal; teeth and gums normal Neck: no adenopathy, no carotid bruit, supple, symmetrical, trachea midline and thyroid not enlarged, symmetric, no tenderness/mass/nodules Back: symmetric, no curvature. ROM normal. No CVA tenderness. Lungs: clear to auscultation bilaterally Heart: regular rate and rhythm, S1, S2 normal, no murmur, click, rub or gallop Abdomen: soft, non-tender; bowel sounds  normal; no masses,  no organomegaly Pulses: 2+ and symmetric Skin: Skin color, texture, turgor normal. No rashes or lesions Lymph nodes: Cervical, supraclavicular, and axillary nodes normal.  Lab Results  Component Value Date   HGBA1C 6.2 02/22/2022   HGBA1C 6.5 06/19/2021   HGBA1C 6.4 12/22/2020    Lab Results  Component Value Date   CREATININE 0.82 02/22/2022   CREATININE 0.74 06/19/2021   CREATININE 0.80 02/15/2021    Lab Results  Component Value Date   WBC 7.5 01/10/2020   HGB 9.0 (L) 01/10/2020   HCT 28.3 (L) 01/10/2020   PLT 220 01/10/2020   GLUCOSE 97 02/22/2022   CHOL 174 02/22/2022   TRIG 31.0 02/22/2022   HDL 95.60 02/22/2022   LDLCALC 72 02/22/2022   ALT 16 02/22/2022   AST 18 02/22/2022   NA 138 02/22/2022   K 3.8 02/22/2022   CL 103 02/22/2022   CREATININE 0.82 02/22/2022   BUN 28 (H) 02/22/2022   CO2 29 02/22/2022   TSH 2.09 12/17/2019   HGBA1C 6.2 02/22/2022   MICROALBUR 0.9 12/17/2019    MM DIAG BREAST TOMO BILATERAL  Result Date: 06/01/2021 CLINICAL DATA:  66 year old female with a palpable area of concern in the right axilla. History of excision of bilateral axillary fat pads, most recently in 2006. EXAM: DIGITAL DIAGNOSTIC BILATERAL MAMMOGRAM WITH TOMOSYNTHESIS AND CAD; ULTRASOUND RIGHT BREAST LIMITED TECHNIQUE: Bilateral digital diagnostic mammography and breast tomosynthesis was performed. The images were evaluated with computer-aided detection.; Targeted ultrasound examination of the right breast was performed COMPARISON:  Previous exams. ACR Breast Density Category c: The breast tissue is heterogeneously dense, which may obscure small masses. FINDINGS: No suspicious masses or calcifications seen in either breast. An initially questioned asymmetry in the right breast resolves on the additional imaging with findings compatible with an area of overlapping fibroglandular tissue. Spot compression tomograms performed over the palpable area of concern in  the right axilla demonstrating prominent fat pad without underlying palpable masses. Physical examination at site of palpable concern in the right axilla reveals prominent soft fullness. Targeted ultrasound of the right axilla was performed. No suspicious masses or abnormalities identified, only prominent fatty tissue identified. Visualized lymph nodes are normal in morphology. IMPRESSION: 1. Palpable area of concern in the right axilla corresponds with a prominent axillary fat pad. 2.  No mammographic evidence of malignancy in either breast. RECOMMENDATION: Screening mammogram in one year.(Code:SM-B-01Y) I have discussed the findings and recommendations with the patient. If applicable, a reminder letter will be sent to the patient regarding the next appointment. BI-RADS CATEGORY  2: Benign. Electronically Signed   By: Everlean Alstrom M.D.   On: 06/01/2021 16:02  US BREAST LTD UNI RIGHT INC AXILLA  Result Date: 06/01/2021 CLINICAL DATA:  66 year old female with a palpable area of concern  in the right axilla. History of excision of bilateral axillary fat pads, most recently in 2006. EXAM: DIGITAL DIAGNOSTIC BILATERAL MAMMOGRAM WITH TOMOSYNTHESIS AND CAD; ULTRASOUND RIGHT BREAST LIMITED TECHNIQUE: Bilateral digital diagnostic mammography and breast tomosynthesis was performed. The images were evaluated with computer-aided detection.; Targeted ultrasound examination of the right breast was performed COMPARISON:  Previous exams. ACR Breast Density Category c: The breast tissue is heterogeneously dense, which may obscure small masses. FINDINGS: No suspicious masses or calcifications seen in either breast. An initially questioned asymmetry in the right breast resolves on the additional imaging with findings compatible with an area of overlapping fibroglandular tissue. Spot compression tomograms performed over the palpable area of concern in the right axilla demonstrating prominent fat pad without underlying palpable  masses. Physical examination at site of palpable concern in the right axilla reveals prominent soft fullness. Targeted ultrasound of the right axilla was performed. No suspicious masses or abnormalities identified, only prominent fatty tissue identified. Visualized lymph nodes are normal in morphology. IMPRESSION: 1. Palpable area of concern in the right axilla corresponds with a prominent axillary fat pad. 2.  No mammographic evidence of malignancy in either breast. RECOMMENDATION: Screening mammogram in one year.(Code:SM-B-01Y) I have discussed the findings and recommendations with the patient. If applicable, a reminder letter will be sent to the patient regarding the next appointment. BI-RADS CATEGORY  2: Benign. Electronically Signed   By: Everlean Alstrom M.D.   On: 06/01/2021 16:02   Assessment & Plan:   Problem List Items Addressed This Visit     Hyperlipidemia associated with type 2 diabetes mellitus ([place])    LDL is at goal on high potency statin  Lab Results  Component Value Date   CHOL 174 02/22/2022   HDL 95.60 02/22/2022   LDLCALC 72 02/22/2022   TRIG 31.0 02/22/2022   CHOLHDL 2 02/22/2022         Diet-controlled diabetes mellitus ([place])    She has lowered her a1c to 6.2 with OPtavia diet and lost 30 lbs.  Microalb/cr ratio needed  Lab Results  Component Value Date   HGBA1C 6.2 02/22/2022   Lab Results  Component Value Date   MICROALBUR 0.9 12/17/2019   MICROALBUR <0.7 11/20/2018          Relevant Orders   Microalbumin / creatinine urine ratio   Hemoglobin A1c   Comprehensive metabolic panel   Lipid Panel w/reflex Direct LDL   Hypervitaminosis D    Secondary to supplementation (5000 Ius daily ).  Asymptomatic.  Repeat in 6 months after cessation of supplement       Abdominal aortic atherosclerosis ([place])    Noted on 2020 MRI abdomen.  She is tolerating higher potency statin .       Elevated BUN - Primary    New, occurring in the fasting state..  Repeating  today       Relevant Orders   Basic metabolic panel   Other Visit Diagnoses     Breast cancer screening by mammogram       Relevant Orders   MM 3D SCREEN BREAST BILATERAL       I spent a total of  30 minutes with this patient in a face to face visit on the date of this encounter reviewing the last office visit with me in January,  patient's current  diet and exercise regimen, home blood pressure readings ,  most recent imaging study ,of breasts,  recent labs  and post visit ordering of testing and therapeutics.  Follow-up: Return in about 6 months (around 08/31/2022) for follow up diabetes.   Crecencio Mc, MD

## 2022-02-28 NOTE — Assessment & Plan Note (Signed)
Noted on 2020 MRI abdomen.  She is tolerating higher potency statin .

## 2022-02-28 NOTE — Assessment & Plan Note (Signed)
Secondary to supplementation (5000 Ius daily ).  Asymptomatic.  Repeat in 6 months after cessation of supplement

## 2022-03-01 DIAGNOSIS — H5203 Hypermetropia, bilateral: Secondary | ICD-10-CM | POA: Diagnosis not present

## 2022-03-01 LAB — BASIC METABOLIC PANEL
BUN: 22 mg/dL (ref 6–23)
CO2: 30 mEq/L (ref 19–32)
Calcium: 9.6 mg/dL (ref 8.4–10.5)
Chloride: 100 mEq/L (ref 96–112)
Creatinine, Ser: 0.88 mg/dL (ref 0.40–1.20)
GFR: 68.12 mL/min (ref >60.00)
Glucose, Bld: 85 mg/dL (ref 70–99)
Potassium: 3.8 mEq/L (ref 3.5–5.1)
Sodium: 137 mEq/L (ref 135–145)

## 2022-03-01 LAB — MICROALBUMIN / CREATININE URINE RATIO
Creatinine,U: 43.9 mg/dL
Microalb Creat Ratio: 1.6 mg/g (ref 0.0–30.0)
Microalb, Ur: <0.7 mg/dL (ref 0.0–1.9)

## 2022-03-07 ENCOUNTER — Other Ambulatory Visit: Payer: Self-pay | Admitting: Internal Medicine

## 2022-03-13 ENCOUNTER — Other Ambulatory Visit: Payer: Self-pay

## 2022-03-13 ENCOUNTER — Encounter: Payer: Self-pay | Admitting: Internal Medicine

## 2022-03-13 MED ORDER — ROSUVASTATIN CALCIUM 20 MG PO TABS: 20.0000 mg | ORAL_TABLET | Freq: Every day | ORAL | 3 refills | Status: DC

## 2022-03-13 MED ORDER — TELMISARTAN 20 MG PO TABS: 20.0000 mg | ORAL_TABLET | Freq: Every day | ORAL | 1 refills | Status: DC

## 2022-03-26 ENCOUNTER — Other Ambulatory Visit: Payer: Medicare Other

## 2022-04-10 ENCOUNTER — Other Ambulatory Visit: Payer: Self-pay | Admitting: *Deleted

## 2022-04-10 NOTE — Patient Outreach (Signed)
  Care Coordination   04/10/2022 Name: DAJAI WAHLERT MRN: [id number] DOB: April 20, 1956   Care Coordination Outreach Attempts:  An unsuccessful telephone outreach was attempted today to offer the patient information about available care coordination services as a benefit of their health plan.   Follow Up Plan:  Additional outreach attempts will be made to offer the patient care coordination information and services.   Encounter Outcome:  No Answer  Care Coordination Interventions Activated:  y  Care Coordination Interventions:  No, not indicated    [facility] [phone]

## 2022-08-28 ENCOUNTER — Ambulatory Visit
Admission: RE | Admit: 2022-08-28 | Discharge: 2022-08-28 | Disposition: A | Discharge status: Home / Self Care | Payer: Medicare HMO | Source: Ambulatory Visit | Attending: Internal Medicine | Admitting: Internal Medicine

## 2022-08-28 DIAGNOSIS — Z1231 Encounter for screening mammogram for malignant neoplasm of breast: Secondary | ICD-10-CM | POA: Diagnosis not present

## 2022-08-31 ENCOUNTER — Encounter: Payer: Self-pay | Admitting: Internal Medicine

## 2022-08-31 ENCOUNTER — Ambulatory Visit (INDEPENDENT_AMBULATORY_CARE_PROVIDER_SITE_OTHER): Payer: Medicare HMO | Admitting: Internal Medicine

## 2022-08-31 VITALS — BP 120/72 | HR 68 | Temp 98.0°F | Ht 65.0 in | Wt 146.4 lb

## 2022-08-31 DIAGNOSIS — E119 Type 2 diabetes mellitus without complications: Secondary | ICD-10-CM | POA: Diagnosis not present

## 2022-08-31 DIAGNOSIS — E785 Hyperlipidemia, unspecified: Secondary | ICD-10-CM | POA: Diagnosis not present

## 2022-08-31 DIAGNOSIS — E1169 Type 2 diabetes mellitus with other specified complication: Secondary | ICD-10-CM | POA: Diagnosis not present

## 2022-08-31 DIAGNOSIS — I700 Atherosclerosis of aorta: Secondary | ICD-10-CM | POA: Diagnosis not present

## 2022-08-31 DIAGNOSIS — Z87828 Personal history of other (healed) physical injury and trauma: Secondary | ICD-10-CM | POA: Diagnosis not present

## 2022-08-31 LAB — COMPREHENSIVE METABOLIC PANEL
ALT: 15 U/L (ref 0–35)
AST: 17 U/L (ref 0–37)
Albumin: 4.2 g/dL (ref 3.5–5.2)
Alkaline Phosphatase: 73 U/L (ref 39–117)
BUN: 26 mg/dL — ABNORMAL HIGH (ref 6–23)
CO2: 29 mEq/L (ref 19–32)
Calcium: 9.3 mg/dL (ref 8.4–10.5)
Chloride: 102 mEq/L (ref 96–112)
Creatinine, Ser: 0.67 mg/dL (ref 0.40–1.20)
GFR: 90.28 mL/min (ref >60.00)
Glucose, Bld: 97 mg/dL (ref 70–99)
Potassium: 3.9 mEq/L (ref 3.5–5.1)
Sodium: 138 mEq/L (ref 135–145)
Total Bilirubin: 0.4 mg/dL (ref 0.2–1.2)
Total Protein: 6.8 g/dL (ref 6.0–8.3)

## 2022-08-31 LAB — HEMOGLOBIN A1C: Hgb A1c MFr Bld: 6.3 % (ref 4.6–6.5)
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
## 2022-08-31 NOTE — Progress Notes (Unsigned)
Subjective:  Patient ID: Kathy [name], female    DOB: 1956-08-18  Age: 67 y.o. MRN: [id number]  CC: There were no encounter diagnoses.    HPI Kathy [name] presents for  Chief Complaint  Patient presents with   Medical Management of Chronic Issues    Hypertension, hyperlipidemia      Outpatient Medications Prior to Visit  Medication Sig Dispense Refill   Apple Cider Vinegar 300 MG TABS Take 300 mg by mouth daily.      Ascorbic Acid (VITAMIN C) 1000 MG tablet Take 1,000 mg by mouth daily.     aspirin 81 MG chewable tablet Chew 1 tablet (81 mg total) by mouth daily. 90 tablet 1   Collagen Hydrolysate, Bovine, POWD      Cranberry 300 MG tablet Take 300 mg by mouth daily.     Cyanocobalamin (VITAMIN B-12) 5000 MCG SUBL Take 5,000 mcg by mouth daily.     Krill Oil 500 MG CAPS Take 500 mg by mouth daily.     MAGNESIUM GLUCONATE PO Take 800 mg by mouth daily.      Multiple Vitamins-Minerals (ADULT ONE DAILY GUMMIES PO) Take 2 tablets by mouth daily. Alive Women 50+     rosuvastatin (CRESTOR) 20 MG tablet Take 1 tablet (20 mg total) by mouth daily. 90 tablet 3   Saccharomyces boulardii (PROBIOTIC) 250 MG CAPS      telmisartan (MICARDIS) 20 MG tablet Take 1 tablet (20 mg total) by mouth at bedtime. 90 tablet 1   Zinc Chelated 22.5 MG TABS Take 1 tablet by mouth daily.      No facility-administered medications prior to visit.    Review of Systems;  Patient denies headache, fevers, malaise, unintentional weight loss, skin rash, eye pain, sinus congestion and sinus pain, sore throat, dysphagia,  hemoptysis , cough, dyspnea, wheezing, chest pain, palpitations, orthopnea, edema, abdominal pain, nausea, melena, diarrhea, constipation, flank pain, dysuria, hematuria, urinary  Frequency, nocturia, numbness, tingling, seizures,  Focal weakness, Loss of consciousness,  Tremor, insomnia, depression, anxiety, and suicidal ideation.      Objective:  BP 120/72   Pulse 68   Temp 98 F (36.7  C) (Oral)   Ht '5\' 5"'$  (1.651 m)   Wt 146 lb 6.4 oz (66.4 kg)   SpO2 98%   BMI 24.36 kg/m   BP Readings from Last 3 Encounters:  08/31/22 120/72  02/28/22 110/70  06/19/21 124/80    Wt Readings from Last 3 Encounters:  08/31/22 146 lb 6.4 oz (66.4 kg)  02/28/22 136 lb 3.2 oz (61.8 kg)  02/07/22 139 lb (63 kg)    Physical Exam  Lab Results  Component Value Date   HGBA1C 6.2 02/22/2022   HGBA1C 6.5 06/19/2021   HGBA1C 6.4 12/22/2020    Lab Results  Component Value Date   CREATININE 0.88 02/28/2022   CREATININE 0.82 02/22/2022   CREATININE 0.74 06/19/2021    Lab Results  Component Value Date   WBC 7.5 01/10/2020   HGB 9.0 (L) 01/10/2020   HCT 28.3 (L) 01/10/2020   PLT 220 01/10/2020   GLUCOSE 85 02/28/2022   CHOL 174 02/22/2022   TRIG 31.0 02/22/2022   HDL 95.60 02/22/2022   LDLCALC 72 02/22/2022   ALT 16 02/22/2022   AST 18 02/22/2022   NA 137 02/28/2022   K 3.8 02/28/2022   CL 100 02/28/2022   CREATININE 0.88 02/28/2022   BUN 22 02/28/2022   CO2 30 02/28/2022   TSH 2.09  12/17/2019   HGBA1C 6.2 02/22/2022   MICROALBUR <0.7 02/28/2022    MM 3D SCREEN BREAST BILATERAL  Result Date: 08/29/2022 CLINICAL DATA:  Screening. EXAM: DIGITAL SCREENING BILATERAL MAMMOGRAM WITH TOMOSYNTHESIS AND CAD TECHNIQUE: Bilateral screening digital craniocaudal and mediolateral oblique mammograms were obtained. Bilateral screening digital breast tomosynthesis was performed. The images were evaluated with computer-aided detection. COMPARISON:  Previous exam(s). ACR Breast Density Category b: There are scattered areas of fibroglandular density. FINDINGS: There are no findings suspicious for malignancy. IMPRESSION: No mammographic evidence of malignancy. A result letter of this screening mammogram will be mailed directly to the patient. RECOMMENDATION: Screening mammogram in one year. (Code:SM-B-01Y) BI-RADS CATEGORY  1: Negative. Electronically Signed   By: Marin Olp M.D.   On:  08/29/2022 11:01    Assessment & Plan:  .There are no diagnoses linked to this encounter.   I provided 30 minutes of face-to-face time during this encounter reviewing patient's last visit with me, patient's  most recent visit with cardiology,  nephrology,  and neurology,  recent surgical and non surgical procedures, previous  labs and imaging studies, counseling on currently addressed issues,  and post visit ordering to diagnostics and therapeutics .   Follow-up: No follow-ups on file.   Crecencio Mc, MD

## 2022-08-31 NOTE — Patient Instructions (Addendum)
You are doing extremely well!  Continue telmisartan:  No changes to meds unless your home BP readings start to  dip below 924 systolic

## 2022-09-01 LAB — LIPID PANEL W/REFLEX DIRECT LDL
Cholesterol: 206 mg/dL — ABNORMAL HIGH (ref <200)
HDL: 121 mg/dL (ref >=50)
LDL Cholesterol (Calc): 74 mg/dL (calc)
Non-HDL Cholesterol (Calc): 85 mg/dL (calc) (ref <130)
Total CHOL/HDL Ratio: 1.7 (calc) (ref <5.0)
Triglycerides: 38 mg/dL (ref <150)

## 2022-09-02 NOTE — Assessment & Plan Note (Signed)
Noted on 2020 MRI abdomen.  She is tolerating higher potency statin .   Lab Results  Component Value Date   CHOL 206 (H) 08/31/2022   HDL 121 08/31/2022   LDLCALC 74 08/31/2022   TRIG 38 08/31/2022   CHOLHDL 1.7 08/31/2022

## 2022-09-02 NOTE — Assessment & Plan Note (Signed)
She has lowered her a1c to 6.2 with OPtavia diet and lost 30 lbs.  She has no proteinuria .  Lab Results  Component Value Date   HGBA1C 6.3 08/31/2022   Lab Results  Component Value Date   MICROALBUR <0.7 02/28/2022   MICROALBUR 0.9 12/17/2019

## 2022-09-02 NOTE — Assessment & Plan Note (Signed)
LDL is at goal on high potency statin  Lab Results  Component Value Date   CHOL 206 (H) 08/31/2022   HDL 121 08/31/2022   LDLCALC 74 08/31/2022   TRIG 38 08/31/2022   CHOLHDL 1.7 08/31/2022

## 2022-12-28 ENCOUNTER — Ambulatory Visit: Payer: Self-pay

## 2022-12-28 NOTE — Patient Outreach (Signed)
  Care Coordination   Initial Visit Note   12/28/2022 Name: LEEBA CHILLEMI MRN: [id number] DOB: [birth year]  CATHIA ECKENRODE is a 67 y.o. year old female who sees Darrick Huntsman, Mar Daring, MD for primary care. I spoke with  Roma Kayser by phone today.  What matters to the patients health and wellness today?  CM provided education on [facility] services.  Patient does not need additional support managing her healthcare needs and declined [facility] services.  Patient agreed to speak with her provider if she needs [facility].    Goals Addressed   None     SDOH assessments and interventions completed:  No     Care Coordination Interventions:  No, not indicated   Follow up plan: No further intervention required.   Encounter Outcome:  Pt. Visit Completed   Lysle Morales, BSW Social Worker [facility]  [phone]

## 2023-02-11 ENCOUNTER — Ambulatory Visit: Payer: Medicare Other

## 2023-02-28 ENCOUNTER — Other Ambulatory Visit: Payer: Self-pay | Admitting: Internal Medicine

## 2023-03-07 ENCOUNTER — Encounter: Payer: Self-pay | Admitting: Internal Medicine

## 2023-03-07 ENCOUNTER — Encounter: Payer: Medicare HMO | Admitting: Internal Medicine

## 2023-03-07 ENCOUNTER — Ambulatory Visit (INDEPENDENT_AMBULATORY_CARE_PROVIDER_SITE_OTHER): Payer: Medicare HMO | Admitting: Internal Medicine

## 2023-03-07 VITALS — BP 120/70 | HR 76 | Temp 98.2°F | Ht 65.0 in | Wt 150.6 lb

## 2023-03-07 DIAGNOSIS — Z0000 Encounter for general adult medical examination without abnormal findings: Secondary | ICD-10-CM

## 2023-03-07 DIAGNOSIS — E1169 Type 2 diabetes mellitus with other specified complication: Secondary | ICD-10-CM

## 2023-03-07 DIAGNOSIS — E559 Vitamin D deficiency, unspecified: Secondary | ICD-10-CM | POA: Diagnosis not present

## 2023-03-07 DIAGNOSIS — Z87828 Personal history of other (healed) physical injury and trauma: Secondary | ICD-10-CM

## 2023-03-07 DIAGNOSIS — E663 Overweight: Secondary | ICD-10-CM

## 2023-03-07 DIAGNOSIS — I10 Essential (primary) hypertension: Secondary | ICD-10-CM

## 2023-03-07 DIAGNOSIS — E119 Type 2 diabetes mellitus without complications: Secondary | ICD-10-CM

## 2023-03-07 DIAGNOSIS — E785 Hyperlipidemia, unspecified: Secondary | ICD-10-CM

## 2023-03-07 LAB — TSH: TSH: 1.89 u[IU]/mL (ref 0.35–5.50)

## 2023-03-07 LAB — LIPID PANEL
Cholesterol: 202 mg/dL — ABNORMAL HIGH (ref 0–200)
HDL: 107.6 mg/dL (ref >39.00)
LDL Cholesterol: 88 mg/dL (ref 0–99)
NonHDL: 94.42
Total CHOL/HDL Ratio: 2
Triglycerides: 32 mg/dL (ref 0.0–149.0)
VLDL: 6.4 mg/dL (ref 0.0–40.0)

## 2023-03-07 LAB — CBC WITH DIFFERENTIAL/PLATELET
Basophils Absolute: 0 10*3/uL (ref 0.0–0.1)
Basophils Relative: 0.6 % (ref 0.0–3.0)
Eosinophils Absolute: 0.1 10*3/uL (ref 0.0–0.7)
Eosinophils Relative: 1.6 % (ref 0.0–5.0)
HCT: 35.7 % — ABNORMAL LOW (ref 36.0–46.0)
Hemoglobin: 11.9 g/dL — ABNORMAL LOW (ref 12.0–15.0)
Lymphocytes Relative: 29.5 % (ref 12.0–46.0)
Lymphs Abs: 1.5 10*3/uL (ref 0.7–4.0)
MCHC: 33.4 g/dL (ref 30.0–36.0)
MCV: 86.3 fl (ref 78.0–100.0)
Monocytes Absolute: 0.3 10*3/uL (ref 0.1–1.0)
Monocytes Relative: 6.8 % (ref 3.0–12.0)
Neutro Abs: 3 10*3/uL (ref 1.4–7.7)
Neutrophils Relative %: 61.5 % (ref 43.0–77.0)
Platelets: 291 10*3/uL (ref 150.0–400.0)
RBC: 4.14 Mil/uL (ref 3.87–5.11)
RDW: 13.9 % (ref 11.5–15.5)
WBC: 4.9 10*3/uL (ref 4.0–10.5)

## 2023-03-07 LAB — COMPREHENSIVE METABOLIC PANEL
ALT: 16 U/L (ref 0–35)
AST: 27 U/L (ref 0–37)
Albumin: 4.3 g/dL (ref 3.5–5.2)
Alkaline Phosphatase: 76 U/L (ref 39–117)
BUN: 22 mg/dL (ref 6–23)
CO2: 28 mEq/L (ref 19–32)
Calcium: 9.6 mg/dL (ref 8.4–10.5)
Chloride: 103 mEq/L (ref 96–112)
Creatinine, Ser: 0.76 mg/dL (ref 0.40–1.20)
GFR: 80.64 mL/min (ref >60.00)
Glucose, Bld: 97 mg/dL (ref 70–99)
Potassium: 4.2 mEq/L (ref 3.5–5.1)
Sodium: 138 mEq/L (ref 135–145)
Total Bilirubin: 0.4 mg/dL (ref 0.2–1.2)
Total Protein: 6.6 g/dL (ref 6.0–8.3)

## 2023-03-07 LAB — VITAMIN D 25 HYDROXY (VIT D DEFICIENCY, FRACTURES): VITD: 40.13 ng/mL (ref 30.00–100.00)

## 2023-03-07 LAB — MICROALBUMIN / CREATININE URINE RATIO
Creatinine,U: 132.9 mg/dL
Microalb Creat Ratio: 0.8 mg/g (ref 0.0–30.0)
Microalb, Ur: 1.1 mg/dL (ref 0.0–1.9)

## 2023-03-07 LAB — HEMOGLOBIN A1C: Hgb A1c MFr Bld: 6.3 % (ref 4.6–6.5)

## 2023-03-07 LAB — LDL CHOLESTEROL, DIRECT: Direct LDL: 62 mg/dL

## 2023-03-07 MED ORDER — TELMISARTAN 20 MG PO TABS: 20.0000 mg | ORAL_TABLET | Freq: Every day | ORAL | 1 refills | Status: DC

## 2023-03-07 MED ORDER — ROSUVASTATIN CALCIUM 20 MG PO TABS: 20.0000 mg | ORAL_TABLET | Freq: Every day | ORAL | 3 refills | Status: DC

## 2023-03-07 NOTE — Assessment & Plan Note (Signed)
LDL is at goal on high potency statin  Lab Results  Component Value Date   CHOL 202 (H) 03/07/2023   HDL 107.60 03/07/2023   LDLCALC 88 03/07/2023   LDLDIRECT 62.0 03/07/2023   TRIG 32.0 03/07/2023   CHOLHDL 2 03/07/2023

## 2023-03-07 NOTE — Assessment & Plan Note (Signed)

## 2023-03-07 NOTE — Assessment & Plan Note (Signed)
The skin on her right lower leg has healed completely and is starting to develop pigmentation

## 2023-03-07 NOTE — Assessment & Plan Note (Signed)
She has lowered her a1c to 6.3 with OPtavia diet and lost 30 lbs.  She has no proteinuria .  Lab Results  Component Value Date   HGBA1C 6.3 03/07/2023   Lab Results  Component Value Date   MICROALBUR 1.1 03/07/2023   MICROALBUR <0.7 02/28/2022
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
## 2023-03-07 NOTE — Assessment & Plan Note (Signed)
I have congratulated her in reduction of   BMI and encouraged  maintenance of current weight  using a low glycemic index diet and regular exercise a minimum of 5 days per week.

## 2023-03-11 ENCOUNTER — Ambulatory Visit: Payer: Medicare HMO | Admitting: *Deleted

## 2023-03-11 VITALS — Ht 65.0 in | Wt 150.0 lb

## 2023-03-11 DIAGNOSIS — Z0000 Encounter for general adult medical examination without abnormal findings: Secondary | ICD-10-CM

## 2023-03-11 NOTE — Progress Notes (Addendum)
Subjective:   Kathy [name] is a 68 y.o. female who presents for Medicare Annual (Subsequent) preventive examination.  Visit Complete: Virtual  I connected with  Roma Kayser on 03/11/23 by a audio enabled telemedicine application and verified that I am speaking with the correct person using two identifiers.  Patient Location: Home  Provider Location: Office/Clinic  I discussed the limitations of evaluation and management by telemedicine. The patient expressed understanding and agreed to proceed.   Review of Systems     Cardiac Risk Factors include: advanced age (>71men, >30 women);diabetes mellitus;hypertension;dyslipidemia     Objective:    Today's Vitals   03/11/23 1247  Weight: 150 lb (68 kg)  Height: 5\' 5"  (1.651 m)   Body mass index is 24.96 kg/m.     03/11/2023   12:58 PM 02/07/2022    8:38 AM 03/06/2021   12:03 PM 02/03/2021   10:43 AM 01/08/2020   12:15 PM 12/30/2019    2:22 PM 08/07/2019    1:35 PM  Advanced Directives  Does Patient Have a Medical Advance Directive? Yes Yes Yes Yes Yes Yes No  Type of Estate agent of [place];Living will Healthcare Power of [place];Living will Living will Healthcare Power of [place];Living will Healthcare Power of [place];Living will Healthcare Power of [place];Living will   Does patient want to make changes to medical advance directive? No - Patient declined No - Patient declined  No - Patient declined No - Patient declined No - Patient declined   Copy of Healthcare Power of Attorney in Chart? Yes - validated most recent copy scanned in chart (See row information) No - copy requested  No - copy requested No - copy requested No - copy requested   Would patient like information on creating a medical advance directive?       Yes (MAU/Ambulatory/Procedural Areas - Information given)    Current Medications (verified) Outpatient Encounter Medications as of 03/11/2023  Medication Sig   Apple Cider Vinegar  300 MG TABS Take 300 mg by mouth daily.    Ascorbic Acid (VITAMIN C) 1000 MG tablet Take 1,000 mg by mouth daily.   aspirin 81 MG chewable tablet Chew 1 tablet (81 mg total) by mouth daily.   Collagen Hydrolysate, Bovine, POWD    Cranberry 300 MG tablet Take 300 mg by mouth daily.   Cyanocobalamin (VITAMIN B-12) 5000 MCG SUBL Take 5,000 mcg by mouth daily.   Krill Oil 500 MG CAPS Take 500 mg by mouth daily.   MAGNESIUM GLUCONATE PO Take 800 mg by mouth daily.    Multiple Vitamins-Minerals (ADULT ONE DAILY GUMMIES PO) Take 2 tablets by mouth daily. Alive Women 50+   rosuvastatin (CRESTOR) 20 MG tablet Take 1 tablet (20 mg total) by mouth daily.   Saccharomyces boulardii (PROBIOTIC) 250 MG CAPS    telmisartan (MICARDIS) 20 MG tablet Take 1 tablet (20 mg total) by mouth at bedtime.   Zinc Chelated 22.5 MG TABS Take 1 tablet by mouth daily.    No facility-administered encounter medications on file as of 03/11/2023.    Allergies (verified) Neosporin [neomycin-bacitracin zn-polymyx]   History: Past Medical History:  Diagnosis Date   Arthritis    mild   Diabetes mellitus without complication (HCC)    Type II,per PCP Dr. Darrick Huntsman after visit sumary  diet controlled   GERD (gastroesophageal reflux disease)    occasional   History of hiatal hernia    Hypertension    Wears contact lenses  Past Surgical History:  Procedure Laterality Date   COLONOSCOPY  2006   COLONOSCOPY N/A 01/03/2016   Procedure: COLONOSCOPY;  Surgeon: Wallace Cullens, MD;  Location: [facility];  Service: Gastroenterology;  Laterality: N/A;   COLONOSCOPY WITH PROPOFOL N/A 03/06/2021   Procedure: COLONOSCOPY WITH PROPOFOL;  Surgeon: Wyline Mood, MD;  Location: [facility];  Service: Gastroenterology;  Laterality: N/A;   fatty tissue under bilateral arm     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   Procedure: RIGHT TOTAL HIP ARTHROPLASTY ANTERIOR APPROACH;  Surgeon: Kathryne Hitch, MD;  Location: [facility];   Service: Orthopedics;  Laterality: Right;   TOTAL HIP ARTHROPLASTY Left 01/08/2020   Procedure: LEFT TOTAL HIP ARTHROPLASTY ANTERIOR APPROACH;  Surgeon: Kathryne Hitch, MD;  Location: [facility];  Service: Orthopedics;  Laterality: Left;   Family History  Problem Relation Age of Onset   Hyperlipidemia Mother    Hypertension Mother    Diabetes Mother    Colon cancer Father    Breast cancer Sister 31   Brain cancer Brother    Social History   Socioeconomic History   Marital status: Widowed    Spouse name: Not on file   Number of children: Not on file   Years of education: Not on file   Highest education level: Not on file  Occupational History   Not on file  Tobacco Use   Smoking status: Never   Smokeless tobacco: Never  Vaping Use   Vaping status: Never Used  Substance and Sexual Activity   Alcohol use: Not Currently    Alcohol/week: 10.0 standard drinks of alcohol    Types: 10 Cans of beer per week    Comment: occasional   Drug use: No   Sexual activity: Not Currently  Other Topics Concern   Not on file  Social History Narrative   Not on file   Social Determinants of Health   Financial Resource Strain: Low Risk  (03/11/2023)   Overall Financial Resource Strain (CARDIA)    Difficulty of Paying Living Expenses: Not hard at all  Food Insecurity: No Food Insecurity (03/11/2023)   Hunger Vital Sign    Worried About Running Out of Food in the Last Year: Never true    Ran Out of Food in the Last Year: Never true  Transportation Needs: No Transportation Needs (03/11/2023)   PRAPARE - Administrator, Civil Service (Medical): No    Lack of Transportation (Non-Medical): No  Physical Activity: Sufficiently Active (03/11/2023)   Exercise Vital Sign    Days of Exercise per Week: 3 days    Minutes of Exercise per Session: 60 min  Stress: No Stress Concern Present (03/11/2023)   [employer] of Occupational Health - Occupational Stress Questionnaire     Feeling of Stress : Not at all  Social Connections: Socially Isolated (03/11/2023)   Social Connection and Isolation Panel [NHANES]    Frequency of Communication with Friends and Family: More than three times a week    Frequency of Social Gatherings with Friends and Family: More than three times a week    Attends Religious Services: Never    Database administrator or Organizations: No    Attends Banker Meetings: Never    Marital Status: Widowed    Tobacco Counseling Counseling given: Not Answered   Clinical Intake:  Pre-visit preparation completed: Yes  Pain : No/denies pain     BMI - recorded: 24.96 Nutritional Status: BMI of 19-24  Normal Nutritional Risks: None Diabetes: Yes (diet) CBG done?: No Did pt. bring in CBG monitor from home?: No  How often do you need to have someone help you when you read instructions, pamphlets, or other written materials from your doctor or pharmacy?: 1 - Never  Interpreter Needed?: No  Information entered by :: R. Porter Nakama LPN   Activities of Daily Living    03/11/2023   12:50 PM 03/08/2023    2:39 PM  In your present state of health, do you have any difficulty performing the following activities:  Hearing? 0 0  Vision? 0 0  Comment contacts   Difficulty concentrating or making decisions? 0 0  Walking or climbing stairs? 0 0  Dressing or bathing? 0 0  Doing errands, shopping? 0 0  Preparing Food and eating ? N N  Using the Toilet? N N  In the past six months, have you accidently leaked urine? N Y  Do you have problems with loss of bowel control? N N  Managing your Medications? N N  Managing your Finances? N N  Housekeeping or managing your Housekeeping? N N    Patient Care Team: Sherlene Shams, MD as PCP - General (Internal Medicine) Sherlene Shams, MD (Internal Medicine) Lemar Livings Merrily Pew, MD (General Surgery)  Indicate any recent Medical Services you may have received from other than [facility] providers in the  past year (date may be approximate).     Assessment:   This is a routine wellness examination for Taylor.  Hearing/Vision screen Hearing Screening - Comments:: No issues Vision Screening - Comments:: contacts  Dietary issues and exercise activities discussed:     Goals Addressed             This Visit's Progress    Patient Stated       Wants to continue her diet to maintain weight and good pressure       Depression Screen    03/11/2023   12:53 PM 03/07/2023    9:06 AM 08/31/2022    8:42 AM 02/28/2022    2:37 PM 02/07/2022    8:27 AM 06/19/2021    9:39 AM 02/03/2021   11:13 AM  PHQ 2/9 Scores  PHQ - 2 Score 0 0 0 0 0 0 0  PHQ- 9 Score 0 2     0    Fall Risk    03/11/2023   12:56 PM 03/08/2023    2:39 PM 03/07/2023    9:05 AM 08/31/2022    8:42 AM 02/28/2022    2:37 PM  Fall Risk   Falls in the past year? 0 0 0 0 0  Number falls in past yr: 0  0    Injury with Fall? 0 0 0    Risk for fall due to : No Fall Risks  No Fall Risks No Fall Risks No Fall Risks  Follow up Falls prevention discussed;Falls evaluation completed  Falls evaluation completed Falls evaluation completed Falls evaluation completed    MEDICARE RISK AT HOME:  Medicare Risk at Home - 03/11/23 1256     Any stairs in or around the home? Yes    If so, are there any without handrails? Yes    Home free of loose throw rugs in walkways, pet beds, electrical cords, etc? Yes    Adequate lighting in your home to reduce risk of falls? Yes    Life alert? No    Use of a cane, walker or w/c? No  Grab bars in the bathroom? Yes    Shower chair or bench in shower? Yes    Elevated toilet seat or a handicapped toilet? Yes              Cognitive Function:        03/11/2023   12:58 PM  6CIT Screen  What Year? 0 points  What month? 0 points  What time? 0 points  Count back from 20 0 points  Months in reverse 0 points  Repeat phrase 0 points  Total Score 0 points    Immunizations Immunization History   Administered Date(s) Administered   PNEUMOCOCCAL CONJUGATE-20 12/22/2020   Tdap 11/16/2013   Zoster Recombinant(Shingrix) 12/08/2020, 04/13/2021    TDAP status: Up to date  Flu Vaccine status: Declined, Education has been provided regarding the importance of this vaccine but patient still declined. Advised may receive this vaccine at local pharmacy or Health Dept. Aware to provide a copy of the vaccination record if obtained from local pharmacy or Health Dept. Verbalized acceptance and understanding.  Pneumococcal vaccine status: Up to date  Covid-19 vaccine status: Declined, Education has been provided regarding the importance of this vaccine but patient still declined. Advised may receive this vaccine at local pharmacy or Health Dept.or vaccine clinic. Aware to provide a copy of the vaccination record if obtained from local pharmacy or Health Dept. Verbalized acceptance and understanding.  Qualifies for Shingles Vaccine? Yes   Zostavax completed No   Shingrix Completed?: Yes  Screening Tests Health Maintenance  Topic Date Due   DEXA SCAN  Never done   OPHTHALMOLOGY EXAM  03/27/2022   FOOT EXAM  06/19/2022   Medicare Annual Wellness (AWV)  02/08/2023   INFLUENZA VACCINE  03/28/2023   MAMMOGRAM  08/29/2023   HEMOGLOBIN A1C  09/07/2023   DTaP/Tdap/Td (2 - Td or Tdap) 11/17/2023   Diabetic kidney evaluation - eGFR measurement  03/06/2024   Diabetic kidney evaluation - Urine ACR  03/06/2024   Colonoscopy  03/06/2026   Pneumonia Vaccine 24+ Years old  Completed   Hepatitis C Screening  Completed   Zoster Vaccines- Shingrix  Completed   HPV VACCINES  Aged Out   COVID-19 Vaccine  Discontinued    Health Maintenance  Health Maintenance Due  Topic Date Due   DEXA SCAN  Never done   OPHTHALMOLOGY EXAM  03/27/2022   FOOT EXAM  06/19/2022   Medicare Annual Wellness (AWV)  02/08/2023    Colorectal cancer screening: Type of screening: Colonoscopy. Completed 7/22. Repeat every 5  years  Mammogram status: Completed 1/24. Repeat every year  Bone Density declined at this time  Lung Cancer Screening: (Low Dose CT Chest recommended if Age 75-80 years, 20 pack-year currently smoking OR have quit w/in 15years.) does not qualify.     Additional Screening:  Hepatitis C Screening: does qualify; Completed 11/16  Vision Screening: Recommended annual ophthalmology exams for early detection of glaucoma and other disorders of the eye. Is the patient up to date with their annual eye exam?  Yes  Who is the provider or what is the name of the office in which the patient attends annual eye exams? [facility] If pt is not established with a provider, would they like to be referred to a provider to establish care? No .   Dental Screening: Recommended annual dental exams for proper oral hygiene  Diabetic Foot Exam: Diabetic Foot Exam: Overdue, Pt has been advised about the importance in completing this exam. Pt is scheduled  for diabetic foot exam on 03/07/23 per patient.  Community Resource Referral / Chronic Care Management: CRR required this visit?  No   CCM required this visit?  No     Plan:     I have personally reviewed and noted the following in the patient's chart:   Medical and social history Use of alcohol, tobacco or illicit drugs  Current medications and supplements including opioid prescriptions. Patient is not currently taking opioid prescriptions. Functional ability and status Nutritional status Physical activity Advanced directives List of other physicians Hospitalizations, surgeries, and ER visits in previous 12 months Vitals Screenings to include cognitive, depression, and falls Referrals and appointments  In addition, I have reviewed and discussed with patient certain preventive protocols, quality metrics, and best practice recommendations. A written personalized care plan for preventive services as well as general preventive health recommendations  were provided to patient.     Sydell Axon, LPN   01/22/4131   After Visit Summary: (MyChart) Due to this being a telephonic visit, the after visit summary with patients personalized plan was offered to patient via MyChart   Nurse Notes:  Will need a diabetic foot exam documented next visit.     I have reviewed the above information and agree with above.   Duncan Dull, MD

## 2023-03-11 NOTE — Patient Instructions (Signed)
Kathy [name] , Thank you for taking time to come for your Medicare Wellness Visit. I appreciate your ongoing commitment to your health goals. Please review the following plan we discussed and let me know if I can assist you in the future.   These are the goals we discussed:  Goals       Maintain Healthy Lifestyle (pt-stated)      Stay active Healthy diet       Patient Stated      Wants to continue her diet to maintain weight and good pressure        This is a list of the screening recommended for you and due dates:  Health Maintenance  Topic Date Due   DEXA scan (bone density measurement)  Never done   Eye exam for diabetics  03/27/2022   Complete foot exam   06/19/2022   Flu Shot  03/28/2023   Mammogram  08/29/2023   Hemoglobin A1C  09/07/2023   DTaP/Tdap/Td vaccine (2 - Td or Tdap) 11/17/2023   Yearly kidney function blood test for diabetes  03/06/2024   Yearly kidney health urinalysis for diabetes  03/06/2024   Medicare Annual Wellness Visit  03/10/2024   Colon Cancer Screening  03/06/2026   Pneumonia Vaccine  Completed   Hepatitis C Screening  Completed   Zoster (Shingles) Vaccine  Completed   HPV Vaccine  Aged Out   COVID-19 Vaccine  Discontinued    Advanced directives: On file  Conditions/risks identified: None  Next appointment: Follow up in one year for your annual wellness visit 03/17/24 @ 8:15   Preventive Care 65 Years and Older, Female Preventive care refers to lifestyle choices and visits with your health care provider that can promote health and wellness. What does preventive care include? A yearly physical exam. This is also called an annual well check. Dental exams once or twice a year. Routine eye exams. Ask your health care provider how often you should have your eyes checked. Personal lifestyle choices, including: Daily care of your teeth and gums. Regular physical activity. Eating a healthy diet. Avoiding tobacco and drug use. Limiting alcohol  use. Practicing safe sex. Taking low-dose aspirin every day. Taking vitamin and mineral supplements as recommended by your health care provider. What happens during an annual well check? The services and screenings done by your health care provider during your annual well check will depend on your age, overall health, lifestyle risk factors, and family history of disease. Counseling  Your health care provider may ask you questions about your: Alcohol use. Tobacco use. Drug use. Emotional well-being. Home and relationship well-being. Sexual activity. Eating habits. History of falls. Memory and ability to understand (cognition). Work and work Astronomer. Reproductive health. Screening  You may have the following tests or measurements: Height, weight, and BMI. Blood pressure. Lipid and cholesterol levels. These may be checked every 5 years, or more frequently if you are over 78 years old. Skin check. Lung cancer screening. You may have this screening every year starting at age 38 if you have a 30-pack-year history of smoking and currently smoke or have quit within the past 15 years. Fecal occult blood test (FOBT) of the stool. You may have this test every year starting at age 84. Flexible sigmoidoscopy or colonoscopy. You may have a sigmoidoscopy every 5 years or a colonoscopy every 10 years starting at age 18. Hepatitis C blood test. Hepatitis B blood test. Sexually transmitted disease (STD) testing. Diabetes screening. This is done by checking  your blood sugar (glucose) after you have not eaten for a while (fasting). You may have this done every 1-3 years. Bone density scan. This is done to screen for osteoporosis. You may have this done starting at age 82. Mammogram. This may be done every 1-2 years. Talk to your health care provider about how often you should have regular mammograms. Talk with your health care provider about your test results, treatment options, and if necessary,  the need for more tests. Vaccines  Your health care provider may recommend certain vaccines, such as: Influenza vaccine. This is recommended every year. Tetanus, diphtheria, and acellular pertussis (Tdap, Td) vaccine. You may need a Td booster every 10 years. Zoster vaccine. You may need this after age 56. Pneumococcal 13-valent conjugate (PCV13) vaccine. One dose is recommended after age 85. Pneumococcal polysaccharide (PPSV23) vaccine. One dose is recommended after age 66. Talk to your health care provider about which screenings and vaccines you need and how often you need them. This information is not intended to replace advice given to you by your health care provider. Make sure you discuss any questions you have with your health care provider. Document Released: 09/09/2015 Document Revised: 05/02/2016 Document Reviewed: 06/14/2015 Elsevier Interactive Patient Education  2017 [employer].  Fall Prevention in the Home Falls can cause injuries. They can happen to people of all ages. There are many things you can do to make your home safe and to help prevent falls. What can I do on the outside of my home? Regularly fix the edges of walkways and driveways and fix any cracks. Remove anything that might make you trip as you walk through a door, such as a raised step or threshold. Trim any bushes or trees on the path to your home. Use bright outdoor lighting. Clear any walking paths of anything that might make someone trip, such as rocks or tools. Regularly check to see if handrails are loose or broken. Make sure that both sides of any steps have handrails. Any raised decks and porches should have guardrails on the edges. Have any leaves, snow, or ice cleared regularly. Use sand or salt on walking paths during winter. Clean up any spills in your garage right away. This includes oil or grease spills. What can I do in the bathroom? Use night lights. Install grab bars by the toilet and in the  tub and shower. Do not use towel bars as grab bars. Use non-skid mats or decals in the tub or shower. If you need to sit down in the shower, use a plastic, non-slip stool. Keep the floor dry. Clean up any water that spills on the floor as soon as it happens. Remove soap buildup in the tub or shower regularly. Attach bath mats securely with double-sided non-slip rug tape. Do not have throw rugs and other things on the floor that can make you trip. What can I do in the bedroom? Use night lights. Make sure that you have a light by your bed that is easy to reach. Do not use any sheets or blankets that are too big for your bed. They should not hang down onto the floor. Have a firm chair that has side arms. You can use this for support while you get dressed. Do not have throw rugs and other things on the floor that can make you trip. What can I do in the kitchen? Clean up any spills right away. Avoid walking on wet floors. Keep items that you use a lot  in easy-to-reach places. If you need to reach something above you, use a strong step stool that has a grab bar. Keep electrical cords out of the way. Do not use floor polish or wax that makes floors slippery. If you must use wax, use non-skid floor wax. Do not have throw rugs and other things on the floor that can make you trip. What can I do with my stairs? Do not leave any items on the stairs. Make sure that there are handrails on both sides of the stairs and use them. Fix handrails that are broken or loose. Make sure that handrails are as long as the stairways. Check any carpeting to make sure that it is firmly attached to the stairs. Fix any carpet that is loose or worn. Avoid having throw rugs at the top or bottom of the stairs. If you do have throw rugs, attach them to the floor with carpet tape. Make sure that you have a light switch at the top of the stairs and the bottom of the stairs. If you do not have them, ask someone to add them for  you. What else can I do to help prevent falls? Wear shoes that: Do not have high heels. Have rubber bottoms. Are comfortable and fit you well. Are closed at the toe. Do not wear sandals. If you use a stepladder: Make sure that it is fully opened. Do not climb a closed stepladder. Make sure that both sides of the stepladder are locked into place. Ask someone to hold it for you, if possible. Clearly mark and make sure that you can see: Any grab bars or handrails. First and last steps. Where the edge of each step is. Use tools that help you move around (mobility aids) if they are needed. These include: Canes. Walkers. Scooters. Crutches. Turn on the lights when you go into a dark area. Replace any light bulbs as soon as they burn out. Set up your furniture so you have a clear path. Avoid moving your furniture around. If any of your floors are uneven, fix them. If there are any pets around you, be aware of where they are. Review your medicines with your doctor. Some medicines can make you feel dizzy. This can increase your chance of falling. Ask your doctor what other things that you can do to help prevent falls. This information is not intended to replace advice given to you by your health care provider. Make sure you discuss any questions you have with your health care provider. Document Released: 06/09/2009 Document Revised: 01/19/2016 Document Reviewed: 09/17/2014 Elsevier Interactive Patient Education  2017 [employer].  Per patient no change in vitals since last visit, unable to obtain new vitals due to telehealth visit

## 2023-04-03 DIAGNOSIS — I700 Atherosclerosis of aorta: Secondary | ICD-10-CM | POA: Diagnosis not present

## 2023-04-03 DIAGNOSIS — Z96649 Presence of unspecified artificial hip joint: Secondary | ICD-10-CM | POA: Diagnosis not present

## 2023-04-03 DIAGNOSIS — E785 Hyperlipidemia, unspecified: Secondary | ICD-10-CM | POA: Diagnosis not present

## 2023-04-03 DIAGNOSIS — Z833 Family history of diabetes mellitus: Secondary | ICD-10-CM | POA: Diagnosis not present

## 2023-04-03 DIAGNOSIS — N189 Chronic kidney disease, unspecified: Secondary | ICD-10-CM | POA: Diagnosis not present

## 2023-04-03 DIAGNOSIS — Z881 Allergy status to other antibiotic agents status: Secondary | ICD-10-CM | POA: Diagnosis not present

## 2023-04-03 DIAGNOSIS — E1122 Type 2 diabetes mellitus with diabetic chronic kidney disease: Secondary | ICD-10-CM | POA: Diagnosis not present

## 2023-04-03 DIAGNOSIS — Z809 Family history of malignant neoplasm, unspecified: Secondary | ICD-10-CM | POA: Diagnosis not present

## 2023-04-03 DIAGNOSIS — I129 Hypertensive chronic kidney disease with stage 1 through stage 4 chronic kidney disease, or unspecified chronic kidney disease: Secondary | ICD-10-CM | POA: Diagnosis not present

## 2023-04-03 DIAGNOSIS — M1990 Unspecified osteoarthritis, unspecified site: Secondary | ICD-10-CM | POA: Diagnosis not present

## 2023-09-03 ENCOUNTER — Encounter: Payer: Self-pay | Admitting: Internal Medicine

## 2023-09-03 DIAGNOSIS — Z1231 Encounter for screening mammogram for malignant neoplasm of breast: Secondary | ICD-10-CM

## 2023-09-03 MED ORDER — TELMISARTAN 20 MG PO TABS: 20.0000 mg | ORAL_TABLET | Freq: Every day | ORAL | 1 refills | Status: DC

## 2023-09-12 ENCOUNTER — Ambulatory Visit (INDEPENDENT_AMBULATORY_CARE_PROVIDER_SITE_OTHER): Payer: PPO | Admitting: Internal Medicine

## 2023-09-12 ENCOUNTER — Encounter: Payer: Self-pay | Admitting: Internal Medicine

## 2023-09-12 ENCOUNTER — Ambulatory Visit: Payer: PPO | Admitting: Internal Medicine

## 2023-09-12 VITALS — BP 118/70 | HR 64 | Temp 97.8°F | Ht 65.0 in | Wt 156.0 lb

## 2023-09-12 DIAGNOSIS — I10 Essential (primary) hypertension: Secondary | ICD-10-CM

## 2023-09-12 DIAGNOSIS — E1169 Type 2 diabetes mellitus with other specified complication: Secondary | ICD-10-CM

## 2023-09-12 DIAGNOSIS — Z780 Asymptomatic menopausal state: Secondary | ICD-10-CM

## 2023-09-12 DIAGNOSIS — I700 Atherosclerosis of aorta: Secondary | ICD-10-CM

## 2023-09-12 DIAGNOSIS — E663 Overweight: Secondary | ICD-10-CM | POA: Diagnosis not present

## 2023-09-12 DIAGNOSIS — T452X1S Poisoning by vitamins, accidental (unintentional), sequela: Secondary | ICD-10-CM

## 2023-09-12 DIAGNOSIS — D500 Iron deficiency anemia secondary to blood loss (chronic): Secondary | ICD-10-CM

## 2023-09-12 DIAGNOSIS — E119 Type 2 diabetes mellitus without complications: Secondary | ICD-10-CM

## 2023-09-12 DIAGNOSIS — E785 Hyperlipidemia, unspecified: Secondary | ICD-10-CM

## 2023-09-12 DIAGNOSIS — Z1231 Encounter for screening mammogram for malignant neoplasm of breast: Secondary | ICD-10-CM

## 2023-09-12 LAB — LIPID PANEL
Cholesterol: 239 mg/dL — ABNORMAL HIGH (ref 0–200)
HDL: 110.4 mg/dL (ref >39.00)
LDL Cholesterol: 123 mg/dL — ABNORMAL HIGH (ref 0–99)
NonHDL: 128.85
Total CHOL/HDL Ratio: 2
Triglycerides: 29 mg/dL (ref 0.0–149.0)
VLDL: 5.8 mg/dL (ref 0.0–40.0)

## 2023-09-12 LAB — COMPREHENSIVE METABOLIC PANEL
ALT: 21 U/L (ref 0–35)
AST: 26 U/L (ref 0–37)
Albumin: 4.4 g/dL (ref 3.5–5.2)
Alkaline Phosphatase: 85 U/L (ref 39–117)
BUN: 22 mg/dL (ref 6–23)
CO2: 29 meq/L (ref 19–32)
Calcium: 9.5 mg/dL (ref 8.4–10.5)
Chloride: 102 meq/L (ref 96–112)
Creatinine, Ser: 0.75 mg/dL (ref 0.40–1.20)
GFR: 81.64 mL/min (ref >60.00)
Glucose, Bld: 97 mg/dL (ref 70–99)
Potassium: 3.9 meq/L (ref 3.5–5.1)
Sodium: 137 meq/L (ref 135–145)
Total Bilirubin: 0.5 mg/dL (ref 0.2–1.2)
Total Protein: 7.1 g/dL (ref 6.0–8.3)

## 2023-09-12 LAB — CBC WITH DIFFERENTIAL/PLATELET
Basophils Absolute: 0 10*3/uL (ref 0.0–0.1)
Basophils Relative: 0.4 % (ref 0.0–3.0)
Eosinophils Absolute: 0.1 10*3/uL (ref 0.0–0.7)
Eosinophils Relative: 1.7 % (ref 0.0–5.0)
HCT: 38.2 % (ref 36.0–46.0)
Hemoglobin: 12.8 g/dL (ref 12.0–15.0)
Lymphocytes Relative: 25.6 % (ref 12.0–46.0)
Lymphs Abs: 1.1 10*3/uL (ref 0.7–4.0)
MCHC: 33.3 g/dL (ref 30.0–36.0)
MCV: 89.6 fL (ref 78.0–100.0)
Monocytes Absolute: 0.3 10*3/uL (ref 0.1–1.0)
Monocytes Relative: 6.6 % (ref 3.0–12.0)
Neutro Abs: 2.9 10*3/uL (ref 1.4–7.7)
Neutrophils Relative %: 65.7 % (ref 43.0–77.0)
Platelets: 323 10*3/uL (ref 150.0–400.0)
RBC: 4.27 Mil/uL (ref 3.87–5.11)
RDW: 13.8 % (ref 11.5–15.5)
WBC: 4.5 10*3/uL (ref 4.0–10.5)

## 2023-09-12 LAB — VITAMIN D 25 HYDROXY (VIT D DEFICIENCY, FRACTURES): VITD: 57.3 ng/mL (ref 30.00–100.00)

## 2023-09-12 LAB — IBC + FERRITIN
Ferritin: 23.8 ng/mL (ref 10.0–291.0)
Iron: 73 ug/dL (ref 42–145)
Saturation Ratios: 16.6 % — ABNORMAL LOW (ref 20.0–50.0)
TIBC: 441 ug/dL (ref 250.0–450.0)
Transferrin: 315 mg/dL (ref 212.0–360.0)

## 2023-09-12 LAB — HEMOGLOBIN A1C: Hgb A1c MFr Bld: 6.4 % (ref 4.6–6.5)

## 2023-09-12 LAB — LDL CHOLESTEROL, DIRECT: Direct LDL: 92 mg/dL

## 2023-09-12 NOTE — Progress Notes (Signed)
Subjective:  Patient ID: Kathy [name], female    DOB: March 16, 1956  Age: 68 y.o. MRN: [id number]  CC: The primary encounter diagnosis was Encounter for screening mammogram for malignant neoplasm of breast. Diagnoses of Postmenopausal estrogen deficiency, Hyperlipidemia associated with type 2 diabetes mellitus (HCC), Diet-controlled diabetes mellitus (HCC), Primary hypertension, Overweight (BMI 25.0-29.9), Poisoning by vitamin D, accidental or unintentional, sequela, Iron deficiency anemia due to chronic blood loss, and Abdominal aortic atherosclerosis (HCC) were also pertinent to this visit.   HPI Kathy [name] presents for  Chief Complaint  Patient presents with   Medical Management of Chronic Issues    1) Type 2 DM:  she feels generally well, is exercising several times per week and checking blood sugars once daily at variable times.  BS have been under 130 fasting and < 150 post prandially.  Denies any recent hypoglyemic events.   Following a carbohydrate modified diet but regaining weight due to increased carb intake.  Denies numbness, burning and tingling of extremities. Appetite is good.     2) HLD:  Stopped crestor   In July due to daughter's concerns that it cause fatty liver.  Cholesterol has risen .  Discussed the risks and benefits of statins.   Hypertension: patient checks blood pressure daily at home.  Readings have been for the most part <130/80 at rest . Patient is following a reduced salt diet most days and is taking medications as prescribed   IDA :  chronically donating  blood last donation was October.  Has been taking iron supplement once a week since last visit.     Outpatient Medications Prior to Visit  Medication Sig Dispense Refill   aspirin 81 MG chewable tablet Chew 1 tablet (81 mg total) by mouth daily. 90 tablet 1   Collagen Hydrolysate, Bovine, POWD      Cranberry 300 MG tablet Take 300 mg by mouth daily.     Cyanocobalamin (VITAMIN B-12) 5000 MCG SUBL Take  5,000 mcg by mouth daily.     Krill Oil 500 MG CAPS Take 500 mg by mouth daily.     MAGNESIUM GLUCONATE PO Take 800 mg by mouth daily.      Multiple Vitamins-Minerals (ADULT ONE DAILY GUMMIES PO) Take 2 tablets by mouth daily. Alive Women 50+     Saccharomyces boulardii (PROBIOTIC) 250 MG CAPS      telmisartan (MICARDIS) 20 MG tablet Take 1 tablet (20 mg total) by mouth at bedtime. 90 tablet 1   Zinc Chelated 22.5 MG TABS Take 1 tablet by mouth daily.      Apple Cider Vinegar 300 MG TABS Take 300 mg by mouth daily.      Ascorbic Acid (VITAMIN C) 1000 MG tablet Take 1,000 mg by mouth daily.     rosuvastatin (CRESTOR) 20 MG tablet Take 1 tablet (20 mg total) by mouth daily. (Patient not taking: Reported on 09/12/2023) 90 tablet 3   No facility-administered medications prior to visit.    Review of Systems;  Patient denies headache, fevers, malaise, unintentional weight loss, skin rash, eye pain, sinus congestion and sinus pain, sore throat, dysphagia,  hemoptysis , cough, dyspnea, wheezing, chest pain, palpitations, orthopnea, edema, abdominal pain, nausea, melena, diarrhea, constipation, flank pain, dysuria, hematuria, urinary  Frequency, nocturia, numbness, tingling, seizures,  Focal weakness, Loss of consciousness,  Tremor, insomnia, depression, anxiety, and suicidal ideation.      Objective:  BP 118/70   Pulse 64   Temp 97.8 F (36.6 C) (  Oral)   Ht 5\' 5"  (1.651 m)   Wt 156 lb (70.8 kg)   SpO2 99%   BMI 25.96 kg/m   BP Readings from Last 3 Encounters:  09/12/23 118/70  03/07/23 120/70  08/31/22 120/72    Wt Readings from Last 3 Encounters:  09/12/23 156 lb (70.8 kg)  03/11/23 150 lb (68 kg)  03/07/23 150 lb 9.6 oz (68.3 kg)    Physical Exam Vitals reviewed.  Constitutional:      General: She is not in acute distress.    Appearance: Normal appearance. She is normal weight. She is not ill-appearing, toxic-appearing or diaphoretic.  HENT:     Head: Normocephalic.  Eyes:      General: No scleral icterus.       Right eye: No discharge.        Left eye: No discharge.     Conjunctiva/sclera: Conjunctivae normal.  Cardiovascular:     Rate and Rhythm: Normal rate and regular rhythm.     Heart sounds: Normal heart sounds.  Pulmonary:     Effort: Pulmonary effort is normal. No respiratory distress.     Breath sounds: Normal breath sounds.  Musculoskeletal:        General: Normal range of motion.  Skin:    General: Skin is warm and dry.     Coloration: Skin is jaundiced.  Neurological:     General: No focal deficit present.     Mental Status: She is alert and oriented to person, place, and time. Mental status is at baseline.  Psychiatric:        Mood and Affect: Mood normal.        Behavior: Behavior normal.        Thought Content: Thought content normal.        Judgment: Judgment normal.    Lab Results  Component Value Date   HGBA1C 6.4 09/12/2023   HGBA1C 6.3 03/07/2023   HGBA1C 6.3 08/31/2022    Lab Results  Component Value Date   CREATININE 0.75 09/12/2023   CREATININE 0.76 03/07/2023   CREATININE 0.67 08/31/2022    Lab Results  Component Value Date   WBC 4.5 09/12/2023   HGB 12.8 09/12/2023   HCT 38.2 09/12/2023   PLT 323.0 09/12/2023   GLUCOSE 97 09/12/2023   CHOL 239 (H) 09/12/2023   TRIG 29.0 09/12/2023   HDL 110.40 09/12/2023   LDLDIRECT 92.0 09/12/2023   LDLCALC 123 (H) 09/12/2023   ALT 21 09/12/2023   AST 26 09/12/2023   NA 137 09/12/2023   K 3.9 09/12/2023   CL 102 09/12/2023   CREATININE 0.75 09/12/2023   BUN 22 09/12/2023   CO2 29 09/12/2023   TSH 1.89 03/07/2023   HGBA1C 6.4 09/12/2023   MICROALBUR 1.1 03/07/2023    MM 3D SCREEN BREAST BILATERAL Result Date: 08/29/2022 CLINICAL DATA:  Screening. EXAM: DIGITAL SCREENING BILATERAL MAMMOGRAM WITH TOMOSYNTHESIS AND CAD TECHNIQUE: Bilateral screening digital craniocaudal and mediolateral oblique mammograms were obtained. Bilateral screening digital breast  tomosynthesis was performed. The images were evaluated with computer-aided detection. COMPARISON:  Previous exam(s). ACR Breast Density Category b: There are scattered areas of fibroglandular density. FINDINGS: There are no findings suspicious for malignancy. IMPRESSION: No mammographic evidence of malignancy. A result letter of this screening mammogram will be mailed directly to the patient. RECOMMENDATION: Screening mammogram in one year. (Code:SM-B-01Y) BI-RADS CATEGORY  1: Negative. Electronically Signed   By: Elberta Fortis M.D.   On: 08/29/2022 11:01    Assessment &  Plan:  .Encounter for screening mammogram for malignant neoplasm of breast  Postmenopausal estrogen deficiency -     DG Bone Density; Future  Hyperlipidemia associated with type 2 diabetes mellitus (HCC) Assessment & Plan: LDL has risen without high potency statin . Reviewed  the guidelines and rationale for resuming statin therapy   Lab Results  Component Value Date   CHOL 239 (H) 09/12/2023   HDL 110.40 09/12/2023   LDLCALC 123 (H) 09/12/2023   LDLDIRECT 92.0 09/12/2023   TRIG 29.0 09/12/2023   CHOLHDL 2 09/12/2023     Orders: -     Lipid panel -     LDL cholesterol, direct  Diet-controlled diabetes mellitus (HCC) Assessment & Plan: A1c has risen from  6.3 to 6.4 after stopping the [place] diet and lshe has gained 20 lbs.  She has no proteinuria .  She will resume carbohydrate restricted lifestyle/diet .  Lab Results  Component Value Date   HGBA1C 6.4 09/12/2023   Lab Results  Component Value Date   MICROALBUR 1.1 03/07/2023   MICROALBUR <0.7 02/28/2022      Orders: -     Comprehensive metabolic panel -     Hemoglobin A1c  Primary hypertension Assessment & Plan: Well controlled on current regimen, and she has no proteinuria. . Renal function stable, no changes today.  Lab Results  Component Value Date   CREATININE 0.75 09/12/2023   Lab Results  Component Value Date   NA 137 09/12/2023   K  3.9 09/12/2023   CL 102 09/12/2023   CO2 29 09/12/2023   Lab Results  Component Value Date   MICROALBUR 1.1 03/07/2023   MICROALBUR <0.7 02/28/2022       Orders: -     Comprehensive metabolic panel  Overweight (BMI 25.0-29.9) Assessment & Plan: She reached a nadir of 136 last year,  and has regained 20 lbs since since.  She plans to resume the [employer] diet    Poisoning by vitamin D, accidental or unintentional, sequela -     VITAMIN D 25 Hydroxy (Vit-D Deficiency, Fractures)  Iron deficiency anemia due to chronic blood loss Assessment & Plan: She has been turned down from donating blood in the past year for hgb<12.. hgb is normal.  Advised her to lenghten the interval between blood donations to every 4-6 months  Lab Results  Component Value Date   WBC 4.5 09/12/2023   HGB 12.8 09/12/2023   HCT 38.2 09/12/2023   MCV 89.6 09/12/2023   PLT 323.0 09/12/2023   Lab Results  Component Value Date   IRON 73 09/12/2023   TIBC 441.0 09/12/2023   FERRITIN 23.8 09/12/2023    Last vitamin B12 and Folate Lab Results  Component Value Date   VITAMINB12 >1500 (H) 05/09/2017     Orders: -     IBC + Ferritin -     CBC with Differential/Platelet  Abdominal aortic atherosclerosis (HCC) Assessment & Plan: Noted on 2020 MRI abdomen.  She was tolerating higher potency statin but stopped because of her daughter's concern about statin effect on liver.  She has historically and currently  no evidence of adverse effects . Given concurrent diabetes,  goal LDL is < 70.  Recommend resuming statin and continued surveillance with liver enzymes every 6 months    Lab Results  Component Value Date   CHOL 202 (H) 03/07/2023   HDL 107.60 03/07/2023   LDLCALC 88 03/07/2023   LDLDIRECT 62.0 03/07/2023   TRIG 32.0 03/07/2023  CHOLHDL 2 03/07/2023    Lab Results  Component Value Date   ALT 21 09/12/2023   AST 26 09/12/2023   ALKPHOS 85 09/12/2023   BILITOT 0.5 09/12/2023         I  provided 30 minutes of face-to-face time during this encounter reviewing patient's last visit with me, patient's  most recent   labs and imaging studies, counseling on currently addressed issues,  and post visit ordering to diagnostics and therapeutics .   Follow-up: Return in about 6 months (around 03/11/2024).   Sherlene Shams, MD

## 2023-09-12 NOTE — Patient Instructions (Addendum)
  Regarding your cholesterol,,  Statin therapy is now considered "standard of care" for primary prevention of heart disease in all patients who have a diagnosis of type 2 diabetes, and/or aortic atherosclerosis    (You have both).    I am recommending, therefore,  that you consider restarting  rosuvastatin .

## 2023-09-12 NOTE — Assessment & Plan Note (Addendum)
Noted on 2020 MRI abdomen.  She was tolerating higher potency statin but stopped because of her daughter's concern about statin effect on liver.  She has historically and currently  no evidence of adverse effects . Given concurrent diabetes,  goal LDL is < 70.  Recommend resuming statin and continued surveillance with liver enzymes every 6 months    Lab Results  Component Value Date   CHOL 202 (H) 03/07/2023   HDL 107.60 03/07/2023   LDLCALC 88 03/07/2023   LDLDIRECT 62.0 03/07/2023   TRIG 32.0 03/07/2023   CHOLHDL 2 03/07/2023    Lab Results  Component Value Date   ALT 21 09/12/2023   AST 26 09/12/2023   ALKPHOS 85 09/12/2023   BILITOT 0.5 09/12/2023

## 2023-09-12 NOTE — Assessment & Plan Note (Addendum)
She reached a nadir of 136 last year,  and has regained 20 lbs since since.  She plans to resume the [employer]

## 2023-09-13 DIAGNOSIS — D649 Anemia, unspecified: Secondary | ICD-10-CM | POA: Insufficient documentation

## 2023-09-13 NOTE — Assessment & Plan Note (Signed)
A1c has risen from  6.3 to 6.4 after stopping the [employer] and lshe has gained 20 lbs.  She has no proteinuria .  She will resume carbohydrate restricted lifestyle/diet .  Lab Results  Component Value Date   HGBA1C 6.4 09/12/2023   Lab Results  Component Value Date   MICROALBUR 1.1 03/07/2023   MICROALBUR <0.7 02/28/2022

## 2023-09-13 NOTE — Assessment & Plan Note (Signed)
She has been turned down from donating blood in the past year for hgb<12.. hgb is normal.  Advised her to lenghten the interval between blood donations to every 4-6 months  Lab Results  Component Value Date   WBC 4.5 09/12/2023   HGB 12.8 09/12/2023   HCT 38.2 09/12/2023   MCV 89.6 09/12/2023   PLT 323.0 09/12/2023   Lab Results  Component Value Date   IRON 73 09/12/2023   TIBC 441.0 09/12/2023   FERRITIN 23.8 09/12/2023    Last vitamin B12 and Folate Lab Results  Component Value Date   VITAMINB12 >1500 (H) 05/09/2017

## 2023-09-13 NOTE — Assessment & Plan Note (Signed)
LDL has risen without high potency statin . Reviewed  the guidelines and rationale for resuming statin therapy   Lab Results  Component Value Date   CHOL 239 (H) 09/12/2023   HDL 110.40 09/12/2023   LDLCALC 123 (H) 09/12/2023   LDLDIRECT 92.0 09/12/2023   TRIG 29.0 09/12/2023   CHOLHDL 2 09/12/2023

## 2023-09-13 NOTE — Assessment & Plan Note (Signed)
Well controlled on current regimen, and she has no proteinuria. . Renal function stable, no changes today.  Lab Results  Component Value Date   CREATININE 0.75 09/12/2023   Lab Results  Component Value Date   NA 137 09/12/2023   K 3.9 09/12/2023   CL 102 09/12/2023   CO2 29 09/12/2023   Lab Results  Component Value Date   MICROALBUR 1.1 03/07/2023   MICROALBUR <0.7 02/28/2022

## 2023-09-14 ENCOUNTER — Encounter: Payer: Self-pay | Admitting: Internal Medicine

## 2023-09-18 ENCOUNTER — Encounter: Payer: Self-pay | Admitting: Internal Medicine

## 2023-09-18 DIAGNOSIS — E1169 Type 2 diabetes mellitus with other specified complication: Secondary | ICD-10-CM

## 2023-09-18 NOTE — Addendum Note (Signed)
Addended by: Sherlene Shams on: 09/18/2023 03:56 PM   Modules accepted: Orders

## 2023-09-19 DIAGNOSIS — H5203 Hypermetropia, bilateral: Secondary | ICD-10-CM | POA: Diagnosis not present

## 2023-09-19 DIAGNOSIS — H524 Presbyopia: Secondary | ICD-10-CM | POA: Diagnosis not present

## 2023-09-19 DIAGNOSIS — E119 Type 2 diabetes mellitus without complications: Secondary | ICD-10-CM | POA: Diagnosis not present

## 2023-09-19 LAB — HM DIABETES EYE EXAM

## 2023-09-24 ENCOUNTER — Ambulatory Visit
Admission: RE | Admit: 2023-09-24 | Discharge: 2023-09-24 | Disposition: A | Discharge status: Home / Self Care | Payer: PPO | Source: Ambulatory Visit | Attending: Internal Medicine | Admitting: Internal Medicine

## 2023-09-24 DIAGNOSIS — Z1231 Encounter for screening mammogram for malignant neoplasm of breast: Secondary | ICD-10-CM | POA: Diagnosis not present

## 2023-12-26 ENCOUNTER — Other Ambulatory Visit (INDEPENDENT_AMBULATORY_CARE_PROVIDER_SITE_OTHER): Payer: PPO

## 2023-12-26 DIAGNOSIS — E1169 Type 2 diabetes mellitus with other specified complication: Secondary | ICD-10-CM

## 2023-12-26 DIAGNOSIS — E785 Hyperlipidemia, unspecified: Secondary | ICD-10-CM

## 2023-12-27 LAB — LIPID PANEL W/REFLEX DIRECT LDL
Cholesterol: 216 mg/dL — ABNORMAL HIGH (ref <200)
HDL: 110 mg/dL (ref >=50)
LDL Cholesterol (Calc): 98 mg/dL
Non-HDL Cholesterol (Calc): 106 mg/dL (ref <130)
Total CHOL/HDL Ratio: 2 (calc) (ref <5.0)
Triglycerides: 28 mg/dL (ref <150)

## 2023-12-29 ENCOUNTER — Encounter: Payer: Self-pay | Admitting: Internal Medicine

## 2024-02-29 ENCOUNTER — Other Ambulatory Visit: Payer: Self-pay | Admitting: Internal Medicine

## 2024-03-12 ENCOUNTER — Ambulatory Visit: Payer: PPO | Admitting: Internal Medicine

## 2024-03-12 ENCOUNTER — Encounter: Payer: Self-pay | Admitting: Internal Medicine

## 2024-03-12 VITALS — BP 112/66 | HR 79 | Ht 65.0 in | Wt 157.6 lb

## 2024-03-12 DIAGNOSIS — S80862S Insect bite (nonvenomous), left lower leg, sequela: Secondary | ICD-10-CM

## 2024-03-12 DIAGNOSIS — W57XXXS Bitten or stung by nonvenomous insect and other nonvenomous arthropods, sequela: Secondary | ICD-10-CM

## 2024-03-12 DIAGNOSIS — E119 Type 2 diabetes mellitus without complications: Secondary | ICD-10-CM | POA: Diagnosis not present

## 2024-03-12 DIAGNOSIS — I10 Essential (primary) hypertension: Secondary | ICD-10-CM | POA: Diagnosis not present

## 2024-03-12 DIAGNOSIS — E1169 Type 2 diabetes mellitus with other specified complication: Secondary | ICD-10-CM

## 2024-03-12 DIAGNOSIS — E663 Overweight: Secondary | ICD-10-CM

## 2024-03-12 DIAGNOSIS — E785 Hyperlipidemia, unspecified: Secondary | ICD-10-CM | POA: Diagnosis not present

## 2024-03-12 DIAGNOSIS — D500 Iron deficiency anemia secondary to blood loss (chronic): Secondary | ICD-10-CM | POA: Diagnosis not present

## 2024-03-12 LAB — TSH: TSH: 1.6 u[IU]/mL (ref 0.35–5.50)

## 2024-03-12 LAB — CBC WITH DIFFERENTIAL/PLATELET
Basophils Absolute: 0 K/uL (ref 0.0–0.1)
Basophils Relative: 0.5 % (ref 0.0–3.0)
Eosinophils Absolute: 0.1 K/uL (ref 0.0–0.7)
Eosinophils Relative: 2 % (ref 0.0–5.0)
HCT: 35.6 % — ABNORMAL LOW (ref 36.0–46.0)
Hemoglobin: 11.9 g/dL — ABNORMAL LOW (ref 12.0–15.0)
Lymphocytes Relative: 23.4 % (ref 12.0–46.0)
Lymphs Abs: 1 K/uL (ref 0.7–4.0)
MCHC: 33.3 g/dL (ref 30.0–36.0)
MCV: 87 fl (ref 78.0–100.0)
Monocytes Absolute: 0.3 K/uL (ref 0.1–1.0)
Monocytes Relative: 5.9 % (ref 3.0–12.0)
Neutro Abs: 3 K/uL (ref 1.4–7.7)
Neutrophils Relative %: 68.2 % (ref 43.0–77.0)
Platelets: 300 K/uL (ref 150.0–400.0)
RBC: 4.1 Mil/uL (ref 3.87–5.11)
RDW: 14.4 % (ref 11.5–15.5)
WBC: 4.4 K/uL (ref 4.0–10.5)

## 2024-03-12 LAB — COMPREHENSIVE METABOLIC PANEL WITH GFR
ALT: 12 U/L (ref 0–35)
AST: 14 U/L (ref 0–37)
Albumin: 4.4 g/dL (ref 3.5–5.2)
Alkaline Phosphatase: 93 U/L (ref 39–117)
BUN: 17 mg/dL (ref 6–23)
CO2: 29 meq/L (ref 19–32)
Calcium: 9.4 mg/dL (ref 8.4–10.5)
Chloride: 104 meq/L (ref 96–112)
Creatinine, Ser: 0.71 mg/dL (ref 0.40–1.20)
GFR: 86.88 mL/min (ref >60.00)
Glucose, Bld: 95 mg/dL (ref 70–99)
Potassium: 4.2 meq/L (ref 3.5–5.1)
Sodium: 140 meq/L (ref 135–145)
Total Bilirubin: 0.4 mg/dL (ref 0.2–1.2)
Total Protein: 6.5 g/dL (ref 6.0–8.3)

## 2024-03-12 LAB — LIPID PANEL
Cholesterol: 224 mg/dL — ABNORMAL HIGH (ref 0–200)
HDL: 110.7 mg/dL (ref >39.00)
LDL Cholesterol: 104 mg/dL — ABNORMAL HIGH (ref 0–99)
NonHDL: 113.25
Total CHOL/HDL Ratio: 2
Triglycerides: 48 mg/dL (ref 0.0–149.0)
VLDL: 9.6 mg/dL (ref 0.0–40.0)

## 2024-03-12 LAB — MICROALBUMIN / CREATININE URINE RATIO
Creatinine,U: 102.8 mg/dL
Microalb Creat Ratio: UNDETERMINED mg/g (ref 0.0–30.0)
Microalb, Ur: <0.7 mg/dL

## 2024-03-12 LAB — IRON,TIBC AND FERRITIN PANEL
%SAT: 15 % — ABNORMAL LOW (ref 16–45)
Ferritin: 12 ng/mL — ABNORMAL LOW (ref 16–288)
Iron: 62 ug/dL (ref 45–160)
TIBC: 412 ug/dL (ref 250–450)

## 2024-03-12 LAB — LDL CHOLESTEROL, DIRECT: Direct LDL: 94 mg/dL

## 2024-03-12 LAB — HEMOGLOBIN A1C: Hgb A1c MFr Bld: 6.4 % (ref 4.6–6.5)

## 2024-03-12 MED ORDER — DOXYCYCLINE HYCLATE 100 MG PO TABS: 100.0000 mg | ORAL_TABLET | Freq: Two times a day (BID) | ORAL | 0 refills | Status: DC

## 2024-03-12 NOTE — Assessment & Plan Note (Signed)
 Still donating blood. Last donation was  mid June and hgb was 14

## 2024-03-12 NOTE — Patient Instructions (Addendum)
 Your bone density scan is due   please give [facility] a call to schedule the appointment. [phone]. Fill the doxycycline  and use it for any insect bite that starts to look infected.  Take with food to prevent nausea,  and  Please take a probiotic  For a minimum of 3 weeks to prevent a serious antibiotic associated diarrhea  Called clostridium dificile colitis     Come join us  August 2nd!

## 2024-03-12 NOTE — Progress Notes (Signed)
 Subjective:  Patient ID: Kathy [name], female    DOB: 1955/12/01  Age: 68 y.o. MRN: [id number]  CC: The primary encounter diagnosis was Primary hypertension. Diagnoses of Hyperlipidemia associated with type 2 diabetes mellitus (HCC), Diet-controlled diabetes mellitus (HCC), Overweight (BMI 25.0-29.9), Iron deficiency anemia due to chronic blood loss, and Tick bite of left lower leg, sequela were also pertinent to this visit.   HPI BOSTYN KUNKLER presents for  Chief Complaint  Patient presents with   Medical Management of Chronic Issues    6 month follow up    1)  Hypertension: patient checks blood pressure twice weekly at home.  Readings have been for the most part <130/80 at rest . Patient is following a reduced salt diet most days and is taking medications as prescribed    Outpatient Medications Prior to Visit  Medication Sig Dispense Refill   aspirin  81 MG chewable tablet Chew 1 tablet (81 mg total) by mouth daily. 90 tablet 1   Collagen Hydrolysate, Bovine, POWD      Cyanocobalamin  (VITAMIN B-12) 5000 MCG SUBL Take 5,000 mcg by mouth daily.     Krill Oil 500 MG CAPS Take 500 mg by mouth daily.     MAGNESIUM GLUCONATE PO Take 800 mg by mouth daily.      Multiple Vitamins-Minerals (ADULT ONE DAILY GUMMIES PO) Take 2 tablets by mouth daily. Alive Women 50+     Saccharomyces boulardii (PROBIOTIC) 250 MG CAPS      telmisartan  (MICARDIS ) 20 MG tablet TAKE 1 TABLET BY MOUTH AT BEDTIME 90 tablet 1   Zinc Chelated 22.5 MG TABS Take 1 tablet by mouth daily.      Apple Cider Vinegar 300 MG TABS Take 300 mg by mouth daily.  (Patient not taking: Reported on 03/12/2024)     Ascorbic Acid  (VITAMIN C ) 1000 MG tablet Take 1,000 mg by mouth daily.     Cranberry 300 MG tablet Take 300 mg by mouth daily. (Patient not taking: Reported on 03/12/2024)     No facility-administered medications prior to visit.    Review of Systems;  Patient denies headache, fevers, malaise, unintentional weight loss,  skin rash, eye pain, sinus congestion and sinus pain, sore throat, dysphagia,  hemoptysis , cough, dyspnea, wheezing, chest pain, palpitations, orthopnea, edema, abdominal pain, nausea, melena, diarrhea, constipation, flank pain, dysuria, hematuria, urinary  Frequency, nocturia, numbness, tingling, seizures,  Focal weakness, Loss of consciousness,  Tremor, insomnia, depression, anxiety, and suicidal ideation.      Objective:  BP 112/66   Pulse 79   Ht 5' 5 (1.651 m)   Wt 157 lb 9.6 oz (71.5 kg)   SpO2 97%   BMI 26.23 kg/m   BP Readings from Last 3 Encounters:  03/12/24 112/66  09/12/23 118/70  03/07/23 120/70    Wt Readings from Last 3 Encounters:  03/12/24 157 lb 9.6 oz (71.5 kg)  09/12/23 156 lb (70.8 kg)  03/11/23 150 lb (68 kg)    Physical Exam Vitals reviewed.  Constitutional:      General: She is not in acute distress.    Appearance: Normal appearance. She is normal weight. She is not ill-appearing, toxic-appearing or diaphoretic.  HENT:     Head: Normocephalic.  Eyes:     General: No scleral icterus.       Right eye: No discharge.        Left eye: No discharge.     Conjunctiva/sclera: Conjunctivae normal.  Cardiovascular:  Rate and Rhythm: Normal rate and regular rhythm.     Heart sounds: Normal heart sounds.  Pulmonary:     Effort: Pulmonary effort is normal. No respiratory distress.     Breath sounds: Normal breath sounds.  Musculoskeletal:        General: Normal range of motion.  Skin:    General: Skin is warm and dry.  Neurological:     General: No focal deficit present.     Mental Status: She is alert and oriented to person, place, and time. Mental status is at baseline.  Psychiatric:        Mood and Affect: Mood normal.        Behavior: Behavior normal.        Thought Content: Thought content normal.        Judgment: Judgment normal.     Lab Results  Component Value Date   HGBA1C 6.4 03/12/2024   HGBA1C 6.4 09/12/2023   HGBA1C 6.3  03/07/2023    Lab Results  Component Value Date   CREATININE 0.71 03/12/2024   CREATININE 0.75 09/12/2023   CREATININE 0.76 03/07/2023    Lab Results  Component Value Date   WBC 4.4 03/12/2024   HGB 11.9 (L) 03/12/2024   HCT 35.6 (L) 03/12/2024   PLT 300.0 03/12/2024   GLUCOSE 95 03/12/2024   CHOL 224 (H) 03/12/2024   TRIG 48.0 03/12/2024   HDL 110.70 03/12/2024   LDLDIRECT 94.0 03/12/2024   LDLCALC 104 (H) 03/12/2024   ALT 12 03/12/2024   AST 14 03/12/2024   NA 140 03/12/2024   K 4.2 03/12/2024   CL 104 03/12/2024   CREATININE 0.71 03/12/2024   BUN 17 03/12/2024   CO2 29 03/12/2024   TSH 1.60 03/12/2024   HGBA1C 6.4 03/12/2024   MICROALBUR <0.7 03/12/2024    MM 3D SCREENING MAMMOGRAM BILATERAL BREAST Result Date: 09/26/2023 CLINICAL DATA:  Screening. EXAM: DIGITAL SCREENING BILATERAL MAMMOGRAM WITH TOMOSYNTHESIS AND CAD TECHNIQUE: Bilateral screening digital craniocaudal and mediolateral oblique mammograms were obtained. Bilateral screening digital breast tomosynthesis was performed. The images were evaluated with computer-aided detection. COMPARISON:  Previous exam(s). ACR Breast Density Category b: There are scattered areas of fibroglandular density. FINDINGS: There are no findings suspicious for malignancy. IMPRESSION: No mammographic evidence of malignancy. A result letter of this screening mammogram will be mailed directly to the patient. RECOMMENDATION: Screening mammogram in one year. (Code:SM-B-01Y) BI-RADS CATEGORY  1: Negative. Electronically Signed   By: Dirk Arrant M.D.   On: 09/26/2023 11:46    Assessment & Plan:  .Primary hypertension -     Comprehensive metabolic panel with GFR -     Microalbumin / creatinine urine ratio -     Comprehensive metabolic panel with GFR; Future  Hyperlipidemia associated with type 2 diabetes mellitus (HCC) -     Lipid panel -     LDL cholesterol, direct -     Lipid Panel w/reflex Direct LDL; Future -     TSH;  Future  Diet-controlled diabetes mellitus (HCC) -     Hemoglobin A1c -     Comprehensive metabolic panel with GFR -     Microalbumin / creatinine urine ratio  Overweight (BMI 25.0-29.9) -     TSH  Iron deficiency anemia due to chronic blood loss Assessment & Plan: Still donating blood. Last donation was  mid June and hgb was 14   Orders: -     Iron, TIBC and Ferritin Panel -     CBC  with Differential/Platelet  Tick bite of left lower leg, sequela Assessment & Plan: Doxycycline  sent to pharmacy for future use.    Other orders -     Doxycycline  Hyclate; Take 1 tablet (100 mg total) by mouth 2 (two) times daily.  Dispense: 14 tablet; Refill: 0     I spent 34 minutes on the day of this face to face encounter reviewing patient's  most recent visit with cardiology,  nephrology,  and neurology,  prior relevant surgical and non surgical procedures, recent  labs and imaging studies, counseling on weight management,  reviewing the assessment and plan with patient, and post visit ordering and reviewing of  diagnostics and therapeutics with patient  .   Follow-up: Return in about 6 months (around 09/12/2024) for physical.   Verneita LITTIE Kettering, MD

## 2024-03-14 DIAGNOSIS — S80862A Insect bite (nonvenomous), left lower leg, initial encounter: Secondary | ICD-10-CM

## 2024-03-14 HISTORY — DX: Insect bite (nonvenomous), left lower leg, initial encounter: S80.862A

## 2024-03-14 NOTE — Assessment & Plan Note (Signed)
 Doxycycline  sent to pharmacy for future use.

## 2024-03-15 ENCOUNTER — Ambulatory Visit: Payer: Self-pay | Admitting: Internal Medicine

## 2024-03-17 ENCOUNTER — Ambulatory Visit (INDEPENDENT_AMBULATORY_CARE_PROVIDER_SITE_OTHER): Payer: Medicare HMO | Admitting: *Deleted

## 2024-03-17 VITALS — Ht 65.0 in | Wt 155.0 lb

## 2024-03-17 DIAGNOSIS — Z0000 Encounter for general adult medical examination without abnormal findings: Secondary | ICD-10-CM | POA: Diagnosis not present

## 2024-03-17 NOTE — Progress Notes (Signed)
 Subjective:   Kathy [name] is a 68 y.o. who presents for a Medicare Wellness preventive visit.  As a reminder, Annual Wellness Visits don't include a physical exam, and some assessments may be limited, especially if this visit is performed virtually. We may recommend an in-person follow-up visit with your provider if needed.  Visit Complete: Virtual I connected with  Kathy [name] on 03/17/24 by a audio enabled telemedicine application and verified that I am speaking with the correct person using two identifiers.  Patient Location: Home  Provider Location: Home Office  I discussed the limitations of evaluation and management by telemedicine. The patient expressed understanding and agreed to proceed.  Vital Signs: Because this visit was a virtual/telehealth visit, some criteria may be missing or patient reported. Any vitals not documented were not able to be obtained and vitals that have been documented are patient reported.  VideoDeclined- This patient declined Librarian, academic. Therefore the visit was completed with audio only.  Persons Participating in Visit: Patient.  AWV Questionnaire: Yes: Patient Medicare AWV questionnaire was completed by the patient on 03/16/24; I have confirmed that all information answered by patient is correct and no changes since this date.  Cardiac Risk Factors include: advanced age (>38men, >37 women);diabetes mellitus;dyslipidemia;hypertension     Objective:    Today's Vitals   03/17/24 0813  Weight: 155 lb (70.3 kg)  Height: 5' 5 (1.651 m)   Body mass index is 25.79 kg/m.     03/17/2024    8:24 AM 03/11/2023   12:58 PM 02/07/2022    8:38 AM 03/06/2021   12:03 PM 02/03/2021   10:43 AM 01/08/2020   12:15 PM 12/30/2019    2:22 PM  Advanced Directives  Does Patient Have a Medical Advance Directive? Yes Yes Yes Yes Yes Yes Yes  Type of Estate agent of [place];Living will Healthcare Power of  [place];Living will Healthcare Power of [place];Living will Living will Healthcare Power of [place];Living will Healthcare Power of [place];Living will Healthcare Power of [place];Living will  Does patient want to make changes to medical advance directive?  No - Patient declined No - Patient declined  No - Patient declined No - Patient declined No - Patient declined  Copy of Healthcare Power of Attorney in Chart? No - copy requested Yes - validated most recent copy scanned in chart (See row information) No - copy requested  No - copy requested No - copy requested No - copy requested    Current Medications (verified) Outpatient Encounter Medications as of 03/17/2024  Medication Sig   aspirin  81 MG chewable tablet Chew 1 tablet (81 mg total) by mouth daily.   Collagen Hydrolysate, Bovine, POWD    Cyanocobalamin  (VITAMIN B-12) 5000 MCG SUBL Take 5,000 mcg by mouth daily.   Krill Oil 500 MG CAPS Take 500 mg by mouth daily.   MAGNESIUM GLUCONATE PO Take 800 mg by mouth daily.    Multiple Vitamins-Minerals (ADULT ONE DAILY GUMMIES PO) Take 2 tablets by mouth daily. Alive Women 50+   Saccharomyces boulardii (PROBIOTIC) 250 MG CAPS    telmisartan  (MICARDIS ) 20 MG tablet TAKE 1 TABLET BY MOUTH AT BEDTIME   Zinc Chelated 22.5 MG TABS Take 1 tablet by mouth daily.    [DISCONTINUED] doxycycline  (VIBRA -TABS) 100 MG tablet Take 1 tablet (100 mg total) by mouth 2 (two) times daily.   No facility-administered encounter medications on file as of 03/17/2024.    Allergies (verified) Neosporin [neomycin-bacitracin zn-polymyx]  History: Past Medical History:  Diagnosis Date   Arthritis    mild   Diabetes mellitus without complication (HCC)    Type II,per PCP Dr. Marylynn after visit sumary  diet controlled   GERD (gastroesophageal reflux disease)    occasional   History of hiatal hernia    Hyperlipidemia    Hypertension    Wears contact lenses    Past Surgical History:  Procedure Laterality Date    COLONOSCOPY  08/27/2004   COLONOSCOPY N/A 01/03/2016   Procedure: COLONOSCOPY;  Surgeon: Deward CINDERELLA [place], MD;  Location: [facility];  Service: Gastroenterology;  Laterality: N/A;   COLONOSCOPY WITH PROPOFOL  N/A 03/06/2021   Procedure: COLONOSCOPY WITH PROPOFOL ;  Surgeon: Therisa Bi, MD;  Location: [facility];  Service: Gastroenterology;  Laterality: N/A;   fatty tissue under bilateral arm     JOINT REPLACEMENT     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   Procedure: RIGHT TOTAL HIP ARTHROPLASTY ANTERIOR APPROACH;  Surgeon: Vernetta Lonni CINDERELLA, MD;  Location: [facility];  Service: Orthopedics;  Laterality: Right;   TOTAL HIP ARTHROPLASTY Left 01/08/2020   Procedure: LEFT TOTAL HIP ARTHROPLASTY ANTERIOR APPROACH;  Surgeon: Vernetta Lonni CINDERELLA, MD;  Location: [facility];  Service: Orthopedics;  Laterality: Left;   Family History  Problem Relation Age of Onset   Hyperlipidemia Mother    Hypertension Mother    Diabetes Mother    Arthritis Mother    Colon cancer Father    Cancer Father    Breast cancer Sister 58   Cancer Sister    Brain cancer Brother    Cancer Brother    Social History   Socioeconomic History   Marital status: Widowed    Spouse name: Not on file   Number of children: Not on file   Years of education: Not on file   Highest education level: 12th grade  Occupational History   Not on file  Tobacco Use   Smoking status: Never   Smokeless tobacco: Never  Vaping Use   Vaping status: Never Used  Substance and Sexual Activity   Alcohol use: Not Currently    Alcohol/week: 10.0 standard drinks of alcohol    Types: 10 Cans of beer per week    Comment: occasional   Drug use: No   Sexual activity: Not Currently  Other Topics Concern   Not on file  Social History Narrative   Not on file   Social Drivers of Health   Financial Resource Strain: Low Risk  (03/17/2024)   Overall Financial Resource Strain (CARDIA)    Difficulty of Paying Living Expenses: Not  hard at all  Food Insecurity: No Food Insecurity (03/17/2024)   Hunger Vital Sign    Worried About Running Out of Food in the Last Year: Never true    Ran Out of Food in the Last Year: Never true  Transportation Needs: No Transportation Needs (03/17/2024)   PRAPARE - Administrator, Civil Service (Medical): No    Lack of Transportation (Non-Medical): No  Physical Activity: Sufficiently Active (03/17/2024)   Exercise Vital Sign    Days of Exercise per Week: 6 days    Minutes of Exercise per Session: 90 min  Stress: No Stress Concern Present (03/17/2024)   [employer] of Occupational Health - Occupational Stress Questionnaire    Feeling of Stress: Not at all  Social Connections: Moderately Isolated (03/17/2024)   Social Connection and Isolation Panel    Frequency of Communication with Friends and Family: More  than three times a week    Frequency of Social Gatherings with Friends and Family: More than three times a week    Attends Religious Services: Never    Database administrator or Organizations: Yes    Attends Engineer, structural: More than 4 times per year    Marital Status: Widowed    Tobacco Counseling Counseling given: Not Answered    Clinical Intake:  Pre-visit preparation completed: Yes  Pain : No/denies pain     BMI - recorded: 25.79 Nutritional Status: BMI 25 -29 Overweight Nutritional Risks: None Diabetes: Yes CBG done?: No Did pt. bring in CBG monitor from home?: No  Lab Results  Component Value Date   HGBA1C 6.4 03/12/2024   HGBA1C 6.4 09/12/2023   HGBA1C 6.3 03/07/2023     How often do you need to have someone help you when you read instructions, pamphlets, or other written materials from your doctor or pharmacy?: 1 - Never  Interpreter Needed?: No  Information entered by :: R. Cythnia Osmun LPN   Activities of Daily Living     03/16/2024    3:34 PM  In your present state of health, do you have any difficulty performing the  following activities:  Hearing? 0  Vision? 0  Difficulty concentrating or making decisions? 0  Walking or climbing stairs? 0  Dressing or bathing? 0  Doing errands, shopping? 0  Preparing Food and eating ? N  Using the Toilet? N  In the past six months, have you accidently leaked urine? N  Do you have problems with loss of bowel control? N  Managing your Medications? N  Managing your Finances? N  Housekeeping or managing your Housekeeping? N    Patient Care Team: Marylynn Verneita CROME, MD as PCP - General (Internal Medicine) Marylynn Verneita CROME, MD (Internal Medicine) Dessa Reyes ORN, MD (General Surgery) Therisa Bi, MD as Consulting Physician (Gastroenterology)  I have updated your Care Teams any recent Medical Services you may have received from other providers in the past year.     Assessment:   This is a routine wellness examination for [place].  Hearing/Vision screen Hearing Screening - Comments:: No issues Vision Screening - Comments:: contacts   Goals Addressed             This Visit's Progress    Patient Stated       Wants to lose about 15 pounds and continue to increase her activities        Depression Screen     03/17/2024    8:18 AM 03/12/2024    8:55 AM 09/12/2023    9:08 AM 03/11/2023   12:53 PM 03/07/2023    9:06 AM 08/31/2022    8:42 AM 02/28/2022    2:37 PM  PHQ 2/9 Scores  PHQ - 2 Score 1 1 0 0 0 0 0  PHQ- 9 Score 2  2 0 2      Fall Risk     03/16/2024    3:34 PM 03/12/2024    8:55 AM 09/12/2023    9:07 AM 03/11/2023   12:56 PM 03/08/2023    2:39 PM  Fall Risk   Falls in the past year? 0 0 0 0 0  Number falls in past yr: 0 0 0 0   Injury with Fall? 0 0 0 0 0  Risk for fall due to : No Fall Risks No Fall Risks  No Fall Risks   Follow up Falls evaluation completed;Falls prevention  discussed Falls evaluation completed Falls evaluation completed Falls prevention discussed;Falls evaluation completed     MEDICARE RISK AT HOME:  Medicare Risk at  Home Any stairs in or around the home?: (Patient-Rptd) Yes If so, are there any without handrails?: (Patient-Rptd) No Home free of loose throw rugs in walkways, pet beds, electrical cords, etc?: (Patient-Rptd) Yes Adequate lighting in your home to reduce risk of falls?: (Patient-Rptd) Yes Life alert?: (Patient-Rptd) No Use of a cane, walker or w/c?: (Patient-Rptd) No Grab bars in the bathroom?: (Patient-Rptd) Yes Shower chair or bench in shower?: (Patient-Rptd) Yes Elevated toilet seat or a handicapped toilet?: (Patient-Rptd) Yes  TIMED UP AND GO:  Was the test performed?  No  Cognitive Function: 6CIT completed        03/17/2024    8:24 AM 03/11/2023   12:58 PM  6CIT Screen  What Year? 0 points 0 points  What month? 0 points 0 points  What time? 0 points 0 points  Count back from 20 0 points 0 points  Months in reverse 0 points 0 points  Repeat phrase 0 points 0 points  Total Score 0 points 0 points    Immunizations Immunization History  Administered Date(s) Administered   PNEUMOCOCCAL CONJUGATE-20 12/22/2020   Tdap 11/16/2013   Zoster Recombinant(Shingrix) 12/08/2020, 04/13/2021    Screening Tests Health Maintenance  Topic Date Due   DEXA SCAN  Never done   DTaP/Tdap/Td (2 - Td or Tdap) 11/17/2023   INFLUENZA VACCINE  03/27/2024   FOOT EXAM  09/11/2024   HEMOGLOBIN A1C  09/12/2024   OPHTHALMOLOGY EXAM  09/18/2024   MAMMOGRAM  09/23/2024   Diabetic kidney evaluation - eGFR measurement  03/12/2025   Diabetic kidney evaluation - Urine ACR  03/12/2025   Medicare Annual Wellness (AWV)  03/17/2025   Colonoscopy  03/06/2026   Pneumococcal Vaccine: 50+ Years  Completed   Hepatitis C Screening  Completed   Zoster Vaccines- Shingrix  Completed   Hepatitis B Vaccines  Aged Out   HPV VACCINES  Aged Out   Meningococcal B Vaccine  Aged Out   COVID-19 Vaccine  Discontinued    Health Maintenance  Health Maintenance Due  Topic Date Due   DEXA SCAN  Never done    DTaP/Tdap/Td (2 - Td or Tdap) 11/17/2023   Health Maintenance Items Addressed: Discussed the need to update tetanus vaccine. Reminded patient that Dexa order was placed 09/12/23 and she needs to call and schedule an appointment.   Additional Screening:  Vision Screening: Recommended annual ophthalmology exams for early detection of glaucoma and other disorders of the eye. Up to date    Eye Would you like a referral to an eye doctor? No    Dental Screening: Recommended annual dental exams for proper oral hygiene  Community Resource Referral / Chronic Care Management: CRR required this visit?  No   CCM required this visit?  No   Plan:    I have personally reviewed and noted the following in the patient's chart:   Medical and social history Use of alcohol, tobacco or illicit drugs  Current medications and supplements including opioid prescriptions. Patient is not currently taking opioid prescriptions. Functional ability and status Nutritional status Physical activity Advanced directives List of other physicians Hospitalizations, surgeries, and ER visits in previous 12 months Vitals Screenings to include cognitive, depression, and falls Referrals and appointments  In addition, I have reviewed and discussed with patient certain preventive protocols, quality metrics, and best practice recommendations. A written personalized care  plan for preventive services as well as general preventive health recommendations were provided to patient.   Angeline Fredericks, LPN   [id number]   After Visit Summary: (MyChart) Due to this being a telephonic visit, the after visit summary with patients personalized plan was offered to patient via MyChart   Notes: Nothing significant to report at this time.

## 2024-03-17 NOTE — Patient Instructions (Signed)
 Ms. Kathy [name] , Thank you for taking time out of your busy schedule to complete your Annual Wellness Visit with me. I enjoyed our conversation and look forward to speaking with you again next year. I, as well as your care team,  appreciate your ongoing commitment to your health goals. Please review the following plan we discussed and let me know if I can assist you in the future. Your Game plan/ To Do List    Referrals: If you haven't heard from the office you've been referred to, please reach out to them at the phone provided.  Remember to update your tetanus vaccines at your pharmacy and call to schedule your Dexa/Bone Density that was ordered 09/12/23. Follow up Visits: Next Medicare AWV with our clinical staff: 03/23/25 @ 8:10   Have you seen your provider in the last 6 months (3 months if uncontrolled diabetes)? Yes Next Office Visit with your provider: 09/17/24  Clinician Recommendations:  Aim for 30 minutes of exercise or brisk walking, 6-8 glasses of water , and 5 servings of fruits and vegetables each day.       This is a list of the screening recommended for you and due dates:  Health Maintenance  Topic Date Due   DEXA scan (bone density measurement)  Never done   DTaP/Tdap/Td vaccine (2 - Td or Tdap) 11/17/2023   Flu Shot  03/27/2024   Complete foot exam   09/11/2024   Hemoglobin A1C  09/12/2024   Eye exam for diabetics  09/18/2024   Mammogram  09/23/2024   Yearly kidney function blood test for diabetes  03/12/2025   Yearly kidney health urinalysis for diabetes  03/12/2025   Medicare Annual Wellness Visit  03/17/2025   Colon Cancer Screening  03/06/2026   Pneumococcal Vaccine for age over 43  Completed   Hepatitis C Screening  Completed   Zoster (Shingles) Vaccine  Completed   Hepatitis B Vaccine  Aged Out   HPV Vaccine  Aged Out   Meningitis B Vaccine  Aged Out   COVID-19 Vaccine  Discontinued    Advanced directives: (Copy Requested) Please bring a copy of your health care  power of attorney and living will to the office to be added to your chart at your convenience. You can mail to [facility] [address] [place], [place] [address] or email to [email or web address] Advance Care Planning is important because it:  [x]  Makes sure you receive the medical care that is consistent with your values, goals, and preferences  [x]  It provides guidance to your family and loved ones and reduces their decisional burden about whether or not they are making the right decisions based on your wishes.

## 2024-07-14 NOTE — Telephone Encounter (Signed)
 open in error

## 2024-08-24 ENCOUNTER — Other Ambulatory Visit: Payer: Self-pay | Admitting: Internal Medicine

## 2024-08-24 DIAGNOSIS — Z1231 Encounter for screening mammogram for malignant neoplasm of breast: Secondary | ICD-10-CM

## 2024-08-29 ENCOUNTER — Other Ambulatory Visit: Payer: Self-pay | Admitting: Internal Medicine

## 2024-09-17 ENCOUNTER — Ambulatory Visit: Admitting: Internal Medicine

## 2024-09-17 ENCOUNTER — Encounter: Payer: Self-pay | Admitting: Internal Medicine

## 2024-09-17 VITALS — BP 128/66 | HR 75 | Ht 65.0 in | Wt 164.8 lb

## 2024-09-17 DIAGNOSIS — E1169 Type 2 diabetes mellitus with other specified complication: Secondary | ICD-10-CM | POA: Diagnosis not present

## 2024-09-17 DIAGNOSIS — E785 Hyperlipidemia, unspecified: Secondary | ICD-10-CM | POA: Diagnosis not present

## 2024-09-17 DIAGNOSIS — R310 Gross hematuria: Secondary | ICD-10-CM

## 2024-09-17 DIAGNOSIS — I700 Atherosclerosis of aorta: Secondary | ICD-10-CM | POA: Diagnosis not present

## 2024-09-17 DIAGNOSIS — I10 Essential (primary) hypertension: Secondary | ICD-10-CM | POA: Diagnosis not present

## 2024-09-17 DIAGNOSIS — Z0000 Encounter for general adult medical examination without abnormal findings: Secondary | ICD-10-CM

## 2024-09-17 DIAGNOSIS — Z780 Asymptomatic menopausal state: Secondary | ICD-10-CM

## 2024-09-17 DIAGNOSIS — M1611 Unilateral primary osteoarthritis, right hip: Secondary | ICD-10-CM | POA: Diagnosis not present

## 2024-09-17 DIAGNOSIS — D500 Iron deficiency anemia secondary to blood loss (chronic): Secondary | ICD-10-CM | POA: Diagnosis not present

## 2024-09-17 DIAGNOSIS — E663 Overweight: Secondary | ICD-10-CM | POA: Diagnosis not present

## 2024-09-17 DIAGNOSIS — M1612 Unilateral primary osteoarthritis, left hip: Secondary | ICD-10-CM | POA: Diagnosis not present

## 2024-09-17 DIAGNOSIS — E119 Type 2 diabetes mellitus without complications: Secondary | ICD-10-CM

## 2024-09-17 LAB — LIPID PANEL
Cholesterol: 233 mg/dL — ABNORMAL HIGH (ref 28–200)
HDL: 102.9 mg/dL
LDL Cholesterol: 123 mg/dL — ABNORMAL HIGH (ref 10–99)
NonHDL: 130.28
Total CHOL/HDL Ratio: 2
Triglycerides: 37 mg/dL (ref 10.0–149.0)
VLDL: 7.4 mg/dL (ref 0.0–40.0)

## 2024-09-17 LAB — COMPREHENSIVE METABOLIC PANEL WITH GFR
ALT: 12 U/L (ref 3–35)
AST: 16 U/L (ref 5–37)
Albumin: 4.3 g/dL (ref 3.5–5.2)
Alkaline Phosphatase: 84 U/L (ref 39–117)
BUN: 15 mg/dL (ref 6–23)
CO2: 30 meq/L (ref 19–32)
Calcium: 9.5 mg/dL (ref 8.4–10.5)
Chloride: 102 meq/L (ref 96–112)
Creatinine, Ser: 0.7 mg/dL (ref 0.40–1.20)
GFR: 88.05 mL/min
Glucose, Bld: 88 mg/dL (ref 70–99)
Potassium: 4 meq/L (ref 3.5–5.1)
Sodium: 138 meq/L (ref 135–145)
Total Bilirubin: 0.3 mg/dL (ref 0.2–1.2)
Total Protein: 6.7 g/dL (ref 6.0–8.3)

## 2024-09-17 LAB — URINALYSIS, ROUTINE W REFLEX MICROSCOPIC
Bilirubin Urine: NEGATIVE
Hgb urine dipstick: NEGATIVE
Ketones, ur: NEGATIVE
Nitrite: NEGATIVE
RBC / HPF: NONE SEEN
Specific Gravity, Urine: 1.01 (ref 1.000–1.030)
Total Protein, Urine: NEGATIVE
Urine Glucose: NEGATIVE
Urobilinogen, UA: 0.2 (ref 0.0–1.0)
pH: 7 (ref 5.0–8.0)

## 2024-09-17 LAB — MICROALBUMIN / CREATININE URINE RATIO
Creatinine,U: 53 mg/dL
Microalb Creat Ratio: UNDETERMINED mg/g (ref 0.0–30.0)
Microalb, Ur: <0.7 mg/dL

## 2024-09-17 LAB — HEMOGLOBIN A1C: Hgb A1c MFr Bld: 6 % (ref 4.6–6.5)

## 2024-09-17 LAB — LDL CHOLESTEROL, DIRECT: Direct LDL: 122 mg/dL

## 2024-09-17 NOTE — Progress Notes (Unsigned)
 Patient ID: Kathy [name], female    DOB: 1956/08/14  Age: 69 y.o. MRN: [id number]  The patient is here for annual preventive examination and management of other chronic and acute problems.   The risk factors are reflected in the social history.   The roster of all physicians providing medical care to patient - is listed in the Snapshot section of the chart.   Activities of daily living:  The patient is 100% independent in all ADLs: dressing, toileting, feeding as well as independent mobility   Home safety : The patient has smoke detectors in the home. They wear seatbelts.  There are no unsecured firearms at home. There is no violence in the home.    There is no risks for hepatitis, STDs or HIV. There is no   history of blood transfusion. They have no travel history to infectious disease endemic areas of the world.   The patient has seen their dentist in the last six month. They have seen their eye doctor in the last year. The patinet  denies slight hearing difficulty with regard to whispered voices and some television programs.  They have deferred audiologic testing in the last year.  They do not  have excessive sun exposure. Discussed the need for sun protection: hats, long sleeves and use of sunscreen if there is significant sun exposure.    Diet: the importance of a healthy diet is discussed. They do have a healthy diet.   The benefits of regular aerobic exercise were discussed. The patient  exercises  3 to 5 days per week  for  60 minutes.    Depression screen: there are no signs or vegative symptoms of depression- irritability, change in appetite, anhedonia, sadness/tearfullness.   The following portions of the patient's history were reviewed and updated as appropriate: allergies, current medications, past family history, past medical history,  past surgical history, past social history  and problem list.   Visual acuity was not assessed per patient preference since the patient has  regular follow up with an  ophthalmologist. Hearing and body mass index were assessed and reviewed.    During the course of the visit the patient was educated and counseled about appropriate screening and preventive services including : fall prevention , diabetes screening, nutrition counseling, colorectal cancer screening, and recommended immunizations.    Chief Complaint:      Review of Symptoms  Patient denies headache, fevers, malaise, unintentional weight loss, skin rash, eye pain, sinus congestion and sinus pain, sore throat, dysphagia,  hemoptysis , cough, dyspnea, wheezing, chest pain, palpitations, orthopnea, edema, abdominal pain, nausea, melena, diarrhea, constipation, flank pain, dysuria, hematuria, urinary  Frequency, nocturia, numbness, tingling, seizures,  Focal weakness, Loss of consciousness,  Tremor, insomnia, depression, anxiety, and suicidal ideation.    Physical Exam:  BP 128/66   Pulse 75   Ht 5' 5 (1.651 m)   Wt 164 lb 12.8 oz (74.8 kg)   SpO2 98%   BMI 27.42 kg/m    Physical Exam  Assessment and Plan: Postmenopausal estrogen deficiency  Primary hypertension  Diet-controlled diabetes mellitus (HCC)  Hyperlipidemia associated with type 2 diabetes mellitus (HCC)    No follow-ups on file.  Verneita LITTIE Kettering, MD

## 2024-09-17 NOTE — Patient Instructions (Addendum)
" °  You are OVERDUE for your tetanus-diptheria-pertussis vaccine   (TDaP) !    Please get this done at your pharmacy l;  it will be PAID FOR MY MEDICARE ONLY AT YOUR PHARMACY   Your bone density test  been ordered.  Please call to make your appointment at [place]  [phone]    I am rechecking your urine today (for blood and infection)   "

## 2024-09-18 ENCOUNTER — Encounter: Payer: Self-pay | Admitting: Internal Medicine

## 2024-09-18 ENCOUNTER — Ambulatory Visit: Payer: Self-pay | Admitting: Internal Medicine

## 2024-09-18 LAB — URINE CULTURE
MICRO NUMBER:: 17502722
Result:: NO GROWTH
SPECIMEN QUALITY:: ADEQUATE

## 2024-09-18 NOTE — Assessment & Plan Note (Signed)
 She reached a nadir of 136 last year,  and has regained 20 lbs since since. She is following a healthy diet and exercising regularly

## 2024-09-18 NOTE — Assessment & Plan Note (Signed)
 S/.p THR 2021 . ROM and flexibility are excellent due to patient's consistent exercise program which includes stretching.

## 2024-09-18 NOTE — Assessment & Plan Note (Signed)
 S/.p THR 2020 . ROM and flexibility are excellent due to patient's consistent exercise program which includes stretching.

## 2024-09-18 NOTE — Assessment & Plan Note (Addendum)
 A1c has been lowered to 6..0  with low GI diet.    She has no proteinuria . Recommend resuming statin and continued surveillance with liver enzymes every 6 months   Lab Results  Component Value Date   HGBA1C 6.0 09/17/2024   Lab Results  Component Value Date   MICROALBUR <0.7 09/17/2024   MICROALBUR <0.7 03/12/2024

## 2024-09-18 NOTE — Assessment & Plan Note (Signed)
 Noted on 2020 MRI abdomen.  She was tolerating higher potency statin but stopped because of her daughter's concern about statin effect on liver.  She has historically and currently  no evidence of adverse effects .   Lab Results  Component Value Date   CHOL 233 (H) 09/17/2024   HDL 102.90 09/17/2024   LDLCALC 123 (H) 09/17/2024   LDLDIRECT 122.0 09/17/2024   TRIG 37.0 09/17/2024   CHOLHDL 2 09/17/2024    Lab Results  Component Value Date   ALT 12 09/17/2024   AST 16 09/17/2024   ALKPHOS 84 09/17/2024   BILITOT 0.3 09/17/2024

## 2024-09-18 NOTE — Assessment & Plan Note (Signed)
 Well controlled on telmisartan  20 mg daily  .  she has no proteinuria. . Renal function stable, no changes today.  Lab Results  Component Value Date   CREATININE 0.70 09/17/2024   Lab Results  Component Value Date   NA 138 09/17/2024   K 4.0 09/17/2024   CL 102 09/17/2024   CO2 30 09/17/2024   Lab Results  Component Value Date   MICROALBUR <0.7 09/17/2024   MICROALBUR <0.7 03/12/2024

## 2024-09-18 NOTE — Assessment & Plan Note (Signed)

## 2024-09-18 NOTE — Assessment & Plan Note (Signed)
 Lab Results  Component Value Date   WBC 4.4 03/12/2024   HGB 11.9 (L) 03/12/2024   HCT 35.6 (L) 03/12/2024   MCV 87.0 03/12/2024   PLT 300.0 03/12/2024

## 2024-09-18 NOTE — Assessment & Plan Note (Addendum)
 HLD is excellent but LDL  has risen without high potency statin . Reviewed  the guidelines and rationale for resuming statin therapy   Lab Results  Component Value Date   CHOL 233 (H) 09/17/2024   HDL 102.90 09/17/2024   LDLCALC 123 (H) 09/17/2024   LDLDIRECT 122.0 09/17/2024   TRIG 37.0 09/17/2024   CHOLHDL 2 09/17/2024

## 2024-09-25 ENCOUNTER — Other Ambulatory Visit: Payer: Self-pay | Admitting: Internal Medicine

## 2024-09-25 ENCOUNTER — Ambulatory Visit
Admission: RE | Admit: 2024-09-25 | Discharge: 2024-09-25 | Disposition: A | Source: Ambulatory Visit | Attending: Internal Medicine | Admitting: Internal Medicine

## 2024-09-25 DIAGNOSIS — Z1231 Encounter for screening mammogram for malignant neoplasm of breast: Secondary | ICD-10-CM | POA: Diagnosis present

## 2024-09-25 DIAGNOSIS — Z780 Asymptomatic menopausal state: Secondary | ICD-10-CM

## 2024-09-25 DIAGNOSIS — I700 Atherosclerosis of aorta: Secondary | ICD-10-CM

## 2024-09-25 DIAGNOSIS — Z0000 Encounter for general adult medical examination without abnormal findings: Secondary | ICD-10-CM

## 2024-09-25 DIAGNOSIS — M1612 Unilateral primary osteoarthritis, left hip: Secondary | ICD-10-CM

## 2024-09-25 DIAGNOSIS — D500 Iron deficiency anemia secondary to blood loss (chronic): Secondary | ICD-10-CM

## 2024-09-25 DIAGNOSIS — E119 Type 2 diabetes mellitus without complications: Secondary | ICD-10-CM

## 2024-09-25 DIAGNOSIS — E663 Overweight: Secondary | ICD-10-CM

## 2024-09-25 DIAGNOSIS — R310 Gross hematuria: Secondary | ICD-10-CM

## 2024-09-25 DIAGNOSIS — M1611 Unilateral primary osteoarthritis, right hip: Secondary | ICD-10-CM

## 2024-09-25 DIAGNOSIS — E1169 Type 2 diabetes mellitus with other specified complication: Secondary | ICD-10-CM

## 2024-09-25 DIAGNOSIS — I10 Essential (primary) hypertension: Secondary | ICD-10-CM

## 2024-09-27 ENCOUNTER — Ambulatory Visit: Payer: Self-pay | Admitting: Internal Medicine

## 2024-10-14 ENCOUNTER — Other Ambulatory Visit

## 2025-03-18 ENCOUNTER — Ambulatory Visit: Admitting: Internal Medicine

## 2025-03-23 ENCOUNTER — Ambulatory Visit
